# Patient Record
Sex: Female | Born: 1945 | Race: Black or African American | Hispanic: No | Smoking: Never smoker
Health system: Southern US, Community
[De-identification: ages and names within clinical notes are randomized; demographics above are authoritative.]

## PROBLEM LIST (undated history)

## (undated) DIAGNOSIS — E538 Deficiency of other specified B group vitamins: Secondary | ICD-10-CM

## (undated) DIAGNOSIS — Z923 Personal history of irradiation: Secondary | ICD-10-CM

## (undated) DIAGNOSIS — Z86718 Personal history of other venous thrombosis and embolism: Secondary | ICD-10-CM

## (undated) DIAGNOSIS — M810 Age-related osteoporosis without current pathological fracture: Secondary | ICD-10-CM

## (undated) DIAGNOSIS — E039 Hypothyroidism, unspecified: Secondary | ICD-10-CM

## (undated) DIAGNOSIS — E1129 Type 2 diabetes mellitus with other diabetic kidney complication: Secondary | ICD-10-CM

## (undated) DIAGNOSIS — D6859 Other primary thrombophilia: Secondary | ICD-10-CM

## (undated) DIAGNOSIS — Z9889 Other specified postprocedural states: Secondary | ICD-10-CM

## (undated) DIAGNOSIS — R809 Proteinuria, unspecified: Secondary | ICD-10-CM

## (undated) DIAGNOSIS — E785 Hyperlipidemia, unspecified: Secondary | ICD-10-CM

## (undated) DIAGNOSIS — I10 Essential (primary) hypertension: Secondary | ICD-10-CM

## (undated) DIAGNOSIS — Z8489 Family history of other specified conditions: Secondary | ICD-10-CM

## (undated) DIAGNOSIS — E559 Vitamin D deficiency, unspecified: Secondary | ICD-10-CM

## (undated) DIAGNOSIS — J45909 Unspecified asthma, uncomplicated: Secondary | ICD-10-CM

## (undated) DIAGNOSIS — K635 Polyp of colon: Secondary | ICD-10-CM

## (undated) DIAGNOSIS — Z8619 Personal history of other infectious and parasitic diseases: Secondary | ICD-10-CM

## (undated) DIAGNOSIS — R112 Nausea with vomiting, unspecified: Secondary | ICD-10-CM

## (undated) DIAGNOSIS — I82409 Acute embolism and thrombosis of unspecified deep veins of unspecified lower extremity: Secondary | ICD-10-CM

## (undated) DIAGNOSIS — E041 Nontoxic single thyroid nodule: Secondary | ICD-10-CM

## (undated) DIAGNOSIS — M1712 Unilateral primary osteoarthritis, left knee: Secondary | ICD-10-CM

## (undated) DIAGNOSIS — R918 Other nonspecific abnormal finding of lung field: Secondary | ICD-10-CM

## (undated) DIAGNOSIS — E0590 Thyrotoxicosis, unspecified without thyrotoxic crisis or storm: Secondary | ICD-10-CM

## (undated) DIAGNOSIS — IMO0002 Reserved for concepts with insufficient information to code with codable children: Secondary | ICD-10-CM

## (undated) DIAGNOSIS — R011 Cardiac murmur, unspecified: Secondary | ICD-10-CM

## (undated) HISTORY — DX: Unspecified asthma, uncomplicated: J45.909

## (undated) HISTORY — DX: Acute embolism and thrombosis of unspecified deep veins of unspecified lower extremity: I82.409

## (undated) HISTORY — PX: KNEE ARTHROSCOPY: SUR90

## (undated) HISTORY — DX: Essential (primary) hypertension: I10

## (undated) HISTORY — DX: Hypothyroidism, unspecified: E03.9

## (undated) HISTORY — DX: Polyp of colon: K63.5

## (undated) HISTORY — DX: Personal history of other venous thrombosis and embolism: Z86.718

## (undated) HISTORY — PX: TUBAL LIGATION: SHX77

## (undated) HISTORY — PX: TONSILLECTOMY: SUR1361

## (undated) HISTORY — DX: Thyrotoxicosis, unspecified without thyrotoxic crisis or storm: E05.90

## (undated) HISTORY — DX: Reserved for concepts with insufficient information to code with codable children: IMO0002

## (undated) HISTORY — DX: Hyperlipidemia, unspecified: E78.5

---

## 1977-09-20 DIAGNOSIS — Z86718 Personal history of other venous thrombosis and embolism: Secondary | ICD-10-CM

## 1977-09-20 HISTORY — DX: Personal history of other venous thrombosis and embolism: Z86.718

## 1978-09-20 DIAGNOSIS — Z86718 Personal history of other venous thrombosis and embolism: Secondary | ICD-10-CM

## 1978-09-20 HISTORY — DX: Personal history of other venous thrombosis and embolism: Z86.718

## 1982-09-20 HISTORY — PX: BREAST BIOPSY: SHX20

## 1989-09-20 HISTORY — PX: ABDOMINAL HYSTERECTOMY: SHX81

## 2001-09-20 HISTORY — PX: BLADDER SUSPENSION: SHX72

## 2003-10-11 LAB — HM PAP SMEAR

## 2004-08-12 ENCOUNTER — Inpatient Hospital Stay (HOSPITAL_COMMUNITY): Admission: RE | Admit: 2004-08-12 | Discharge: 2004-08-13 | Payer: Self-pay | Admitting: Gynecology

## 2005-03-12 ENCOUNTER — Emergency Department: Payer: Self-pay | Admitting: General Practice

## 2005-03-12 ENCOUNTER — Other Ambulatory Visit: Payer: Self-pay

## 2005-06-17 ENCOUNTER — Observation Stay (HOSPITAL_COMMUNITY): Admission: EM | Admit: 2005-06-17 | Discharge: 2005-06-18 | Payer: Self-pay | Admitting: Emergency Medicine

## 2005-06-18 ENCOUNTER — Ambulatory Visit: Payer: Self-pay | Admitting: Cardiovascular Disease

## 2005-06-23 ENCOUNTER — Ambulatory Visit: Payer: Self-pay

## 2005-10-10 LAB — HM COLONOSCOPY

## 2006-05-31 ENCOUNTER — Ambulatory Visit: Payer: Self-pay | Admitting: Cardiology

## 2006-05-31 ENCOUNTER — Inpatient Hospital Stay (HOSPITAL_COMMUNITY): Admission: EM | Admit: 2006-05-31 | Discharge: 2006-06-01 | Payer: Self-pay | Admitting: Emergency Medicine

## 2006-06-07 ENCOUNTER — Ambulatory Visit: Payer: Self-pay

## 2006-06-17 ENCOUNTER — Ambulatory Visit: Payer: Self-pay | Admitting: Cardiology

## 2006-06-23 ENCOUNTER — Ambulatory Visit: Payer: Self-pay | Admitting: Family Medicine

## 2006-07-25 ENCOUNTER — Ambulatory Visit: Payer: Self-pay | Admitting: Urology

## 2006-07-27 ENCOUNTER — Emergency Department: Payer: Self-pay | Admitting: Emergency Medicine

## 2006-07-28 ENCOUNTER — Ambulatory Visit: Payer: Self-pay | Admitting: Internal Medicine

## 2006-08-08 ENCOUNTER — Ambulatory Visit: Payer: Self-pay | Admitting: Internal Medicine

## 2008-01-02 ENCOUNTER — Ambulatory Visit: Payer: Self-pay | Admitting: Unknown Physician Specialty

## 2008-01-18 ENCOUNTER — Ambulatory Visit: Payer: Self-pay

## 2009-04-09 ENCOUNTER — Inpatient Hospital Stay: Payer: Self-pay | Admitting: Internal Medicine

## 2011-03-08 ENCOUNTER — Emergency Department: Payer: Self-pay

## 2011-03-12 ENCOUNTER — Ambulatory Visit: Payer: Self-pay

## 2011-03-12 ENCOUNTER — Ambulatory Visit: Payer: Self-pay | Admitting: "Endocrinology

## 2011-03-30 ENCOUNTER — Ambulatory Visit: Payer: Self-pay

## 2011-09-21 HISTORY — PX: CORONARY ANGIOPLASTY: SHX604

## 2011-11-11 ENCOUNTER — Ambulatory Visit: Payer: Self-pay | Admitting: "Endocrinology

## 2012-03-10 LAB — HM MAMMOGRAPHY: HM Mammogram: NORMAL

## 2012-10-10 ENCOUNTER — Telehealth: Payer: Self-pay | Admitting: Internal Medicine

## 2012-10-10 ENCOUNTER — Ambulatory Visit (INDEPENDENT_AMBULATORY_CARE_PROVIDER_SITE_OTHER): Payer: Medicare Other | Admitting: Internal Medicine

## 2012-10-10 ENCOUNTER — Encounter: Payer: Self-pay | Admitting: Internal Medicine

## 2012-10-10 VITALS — BP 128/78 | HR 58 | Temp 98.5°F | Resp 16 | Ht 59.0 in | Wt 152.0 lb

## 2012-10-10 DIAGNOSIS — E039 Hypothyroidism, unspecified: Secondary | ICD-10-CM | POA: Insufficient documentation

## 2012-10-10 DIAGNOSIS — K589 Irritable bowel syndrome without diarrhea: Secondary | ICD-10-CM

## 2012-10-10 DIAGNOSIS — E785 Hyperlipidemia, unspecified: Secondary | ICD-10-CM | POA: Insufficient documentation

## 2012-10-10 DIAGNOSIS — M810 Age-related osteoporosis without current pathological fracture: Secondary | ICD-10-CM

## 2012-10-10 DIAGNOSIS — E079 Disorder of thyroid, unspecified: Secondary | ICD-10-CM

## 2012-10-10 DIAGNOSIS — F411 Generalized anxiety disorder: Secondary | ICD-10-CM

## 2012-10-10 DIAGNOSIS — I10 Essential (primary) hypertension: Secondary | ICD-10-CM | POA: Insufficient documentation

## 2012-10-10 DIAGNOSIS — F419 Anxiety disorder, unspecified: Secondary | ICD-10-CM

## 2012-10-10 DIAGNOSIS — J45909 Unspecified asthma, uncomplicated: Secondary | ICD-10-CM

## 2012-10-10 MED ORDER — DICYCLOMINE HCL 10 MG PO CAPS: 10.0000 mg | ORAL_CAPSULE | Freq: Three times a day (TID) | ORAL | Status: DC

## 2012-10-10 NOTE — Telephone Encounter (Signed)
At check out pt stated she [facility]

## 2012-10-10 NOTE — Telephone Encounter (Signed)
Chart updated with information.

## 2012-10-10 NOTE — Patient Instructions (Addendum)
We will order the bone density test and the nebulizer  Resume zyrtec daily for allergies when needed   We will try dicylcomine for your IBS  I wilsend rx to [facility]or[facility] pharmacy

## 2012-10-10 NOTE — Progress Notes (Signed)
Patient ID: Christine Manning, female   DOB: [birth year], 68 y.o.   MRN: [id number]  Patient Active Problem List  Diagnosis  . Hyperlipidemia  . Hypertension  . Asthma  . Thyroid disease  . Anxiety  . Irritable bowel syndrome  . Postmenopausal osteoporosis    Subjective:  CC:   Chief Complaint  Patient presents with  . Establish Care    HPI:   Christine Manning is a 67 y.o. female who presents as a new patient to establish primary care with the chief complaint of Transfer  From [facility],  sees Endocrine for thyroid,  Moved here from [place] years ago to care for ailing parents .   1) thyroid nodule s/p low thyroid, takes 150 mcg daily for the past 3 years. Went for a year without meds due to lack of $$$$  Had labs done last Tuesday .  Takes it currently alone in the morning. Currently out of meds from [facility]. Tp XRT.  Medicare.    2) osteoporosis; 5th finger fractures bilaterally from lifting boxes.  Since 1998.   steroid induced ,  Managed initially with alendronate now  with boniva currently monthly which is causing nausea and vomiting.  Prior 1 yr therapy with Forteo.   Wants repeat DEXA and alternative medication.   3) asthma: retired from [employer] and had lots of envirnmental office triggers causing attacks.  Attacks had been reduced to monthly since retirement 2010.  Last ER visit over one year. uses Advair twice daily,  Ventolin prn wants a home nebulizer for use dring severe attacks.   4) hyperlipidemia, hypertension  previously treated with lipitor   5) thumb pain and knee pain due to 14 yrs of standing on cement floor . Bilateral thumb pain aggravated by opening jars and putting on pntyhose.  Use to run, whear heels   6) Left knee pain,  Prior films showed DJD severe . Had cardiac arrest in 2009  during anesthesia due to underactive thyroid. Cardiac cath followed.   7) insomnia.  Due to dad's sundowning and nighttime wanderings   Past Medical History  Diagnosis Date    . Hyperlipidemia   . Hypertension   . Asthma   . Thyroid disease     Past Surgical History  Procedure Date  . Bladder suspension 2003  . Abdominal hysterectomy 1991    secondary to abnormal PAPs  . Coronary angioplasty 2013    arteries were clean .  Dr. Laveda Norman at [facility]     Family History  Problem Relation Age of Onset  . Asthma Mother   . Mental illness Mother     multi infarct dementia  . Stroke Mother   . Mental illness Father     alzheimers dementia  . Asthma Brother   . Asthma Son   . Cancer Maternal Aunt     colon ca  . Cancer Paternal Aunt     colon ca  . Cancer Maternal Grandmother     uterine ca    History   Social History  . Marital Status: Divorced    Spouse Name: N/A    Number of Children: N/A  . Years of Education: N/A   Occupational History  . Not on file.   Social History Main Topics  . Smoking status: Never Smoker   . Smokeless tobacco: Not on file  . Alcohol Use: No  . Drug Use: No  . Sexually Active: Not on file   Other Topics Concern  . 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     Allergies  Allergen Reactions  . Codeine Hives and Shortness Of Breath  . Latex Hives and Swelling  . Augmentin (Amoxicillin-Pot Clavulanate) Other (See Comments)    Causes IBS     Review of Systems:   The remainder of the review of systems was negative except those addressed in the HPI.       Objective:  BP 128/78  Pulse 58  Temp 98.5 F (36.9 C) (Oral)  Resp 16  Ht 4\' 11"  (1.499 m)  Wt 152 lb (68.947 kg)  BMI 30.70 kg/m2  SpO2 98%  General appearance: alert, cooperative and appears stated age Ears: normal TM's and external ear canals both ears Throat: lips, mucosa, and tongue normal; teeth and gums normal Neck: no adenopathy, no carotid bruit, supple, symmetrical, trachea midline and thyroid not enlarged, symmetric, no tenderness/mass/nodules Back: symmetric, no curvature. ROM normal.  No CVA tenderness. Lungs: clear to auscultation bilaterally Heart: regular rate and rhythm, S1, S2 normal, no murmur, click, rub or gallop Abdomen: soft, non-tender; bowel sounds normal; no masses,  no organomegaly Pulses: 2+ and symmetric Skin: Skin color, texture, turgor normal. No rashes or lesions Lymph nodes: Cervical, supraclavicular, and axillary nodes normal.  Assessment and Plan:  Irritable bowel syndrome Trial of bentyl for postprandial cramping and diarrhea.   Thyroid disease Her thyroid is still underactive after a year of not being medicated.  Samples od 150 mcg,  Her current dose given while she is awaiting repeat TSH.   Postmenopausal osteoporosis Managed with bisphosphonate therapy. Repeat DEXA is ordered.   Asthma She is requesting a home nebulizer for management of asthma. Discussed the risks and benefits and she has agreed to call the office when she is symptomatic enough to use the neb.   Updated Medication List Outpatient Encounter Prescriptions as of 10/10/2012  Medication Sig Dispense Refill  . Albuterol Sulfate (VENTOLIN HFA IN) Inhale 90 mcg into the lungs. 2 puffs every four hours as needed.      Marland Kitchen aspirin 81 MG tablet Take 81 mg by mouth daily.      . Fluticasone-Salmeterol (ADVAIR) 250-50 MCG/DOSE AEPB Inhale 1 puff into the lungs every 12 (twelve) hours.      . hydrochlorothiazide (HYDRODIURIL) 25 MG tablet Take 25 mg by mouth daily.      . hydrOXYzine (ATARAX/VISTARIL) 25 MG tablet Take 25 mg by mouth 3 (three) times daily as needed.      . ibandronate (BONIVA) 150 MG tablet Take 150 mg by mouth every 30 (thirty) days. Take in the morning with a full glass of water, on an empty stomach, and do not take anything else by mouth or lie down for the next 30 min.      Marland Kitchen levothyroxine (SYNTHROID, LEVOTHROID) 150 MCG tablet Take 150 mcg by mouth daily.      Marland Kitchen lisinopril (PRINIVIL,ZESTRIL) 20 MG tablet Take 20 mg by mouth daily.      . metoprolol tartrate  (LOPRESSOR) 25 MG tablet Take 25 mg by mouth 2 (two) times daily.      . mometasone (NASONEX) 50 MCG/ACT nasal spray Place 2 sprays into the nose daily.      . nitroGLYCERIN (NITROSTAT) 0.4 MG SL tablet Place 0.4 mg under the tongue every 5 (five) minutes as needed.      . pantoprazole (PROTONIX) 40 MG tablet Take 40 mg by mouth daily.      Marland Kitchen  predniSONE (DELTASONE) 10 MG tablet Take 10 mg by mouth daily.      . simvastatin (ZOCOR) 40 MG tablet Take 40 mg by mouth every evening.      . dicyclomine (BENTYL) 10 MG capsule Take 1 capsule (10 mg total) by mouth 4 (four) times daily -  before meals and at bedtime.  90 capsule  2     Orders Placed This Encounter  Procedures  . HM MAMMOGRAPHY  . DG Bone Density  . HM PAP SMEAR  . AMB REFERRAL FOR DME  . HM COLONOSCOPY    Return in about 4 weeks (around 11/07/2012).

## 2012-10-11 ENCOUNTER — Encounter: Payer: Self-pay | Admitting: Internal Medicine

## 2012-10-11 DIAGNOSIS — F419 Anxiety disorder, unspecified: Secondary | ICD-10-CM | POA: Insufficient documentation

## 2012-10-11 DIAGNOSIS — M810 Age-related osteoporosis without current pathological fracture: Secondary | ICD-10-CM | POA: Insufficient documentation

## 2012-10-11 DIAGNOSIS — K589 Irritable bowel syndrome without diarrhea: Secondary | ICD-10-CM | POA: Insufficient documentation

## 2012-10-11 NOTE — Assessment & Plan Note (Signed)
Her thyroid is still underactive after a year of not being medicated.  Samples od 150 mcg,  Her current dose given while she is awaiting repeat TSH.

## 2012-10-11 NOTE — Assessment & Plan Note (Signed)
She is requesting a home nebulizer for management of asthma. Discussed the risks and benefits and she has agreed to call the office when she is symptomatic enough to use the neb.

## 2012-10-11 NOTE — Assessment & Plan Note (Signed)
Trial of bentyl for postprandial cramping and diarrhea.

## 2012-10-11 NOTE — Assessment & Plan Note (Signed)
Managed with bisphosphonate therapy. Repeat DEXA is ordered.

## 2012-10-23 ENCOUNTER — Institutional Professional Consult (permissible substitution): Payer: Medicare Other | Admitting: Pulmonary Disease

## 2012-11-04 ENCOUNTER — Emergency Department: Payer: Self-pay | Admitting: Emergency Medicine

## 2012-11-04 LAB — BASIC METABOLIC PANEL
Anion Gap: 7 (ref 7–16)
BUN: 14 mg/dL (ref 7–18)
Calcium, Total: 8.8 mg/dL (ref 8.5–10.1)
Creatinine: 0.9 mg/dL (ref 0.60–1.30)
EGFR (Non-African Amer.): >60
Glucose: 90 mg/dL (ref 65–99)
Potassium: 3.6 mmol/L (ref 3.5–5.1)

## 2012-11-04 LAB — TROPONIN I: Troponin-I: <0.02 ng/mL

## 2012-11-04 LAB — CBC
HGB: 11.7 g/dL — ABNORMAL LOW (ref 12.0–16.0)
MCHC: 33.3 g/dL (ref 32.0–36.0)
Platelet: 316 10*3/uL (ref 150–440)
RBC: 3.75 10*6/uL — ABNORMAL LOW (ref 3.80–5.20)
WBC: 4.7 10*3/uL (ref 3.6–11.0)

## 2012-11-04 LAB — CK TOTAL AND CKMB (NOT AT ARMC): CK, Total: 72 U/L (ref 21–215)

## 2012-11-07 ENCOUNTER — Encounter: Payer: Self-pay | Admitting: Internal Medicine

## 2012-11-07 ENCOUNTER — Ambulatory Visit (INDEPENDENT_AMBULATORY_CARE_PROVIDER_SITE_OTHER): Payer: Medicare Other | Admitting: Internal Medicine

## 2012-11-07 VITALS — BP 128/88 | HR 92 | Temp 97.8°F | Resp 17 | Wt 143.8 lb

## 2012-11-07 DIAGNOSIS — R112 Nausea with vomiting, unspecified: Secondary | ICD-10-CM

## 2012-11-07 DIAGNOSIS — J111 Influenza due to unidentified influenza virus with other respiratory manifestations: Secondary | ICD-10-CM

## 2012-11-07 DIAGNOSIS — R42 Dizziness and giddiness: Secondary | ICD-10-CM

## 2012-11-07 DIAGNOSIS — H6690 Otitis media, unspecified, unspecified ear: Secondary | ICD-10-CM

## 2012-11-07 DIAGNOSIS — I951 Orthostatic hypotension: Secondary | ICD-10-CM

## 2012-11-07 DIAGNOSIS — E860 Dehydration: Secondary | ICD-10-CM

## 2012-11-07 LAB — CBC WITH DIFFERENTIAL/PLATELET
Basophils Absolute: 0.1 10*3/uL (ref 0.0–0.1)
Basophils Relative: 1.3 % (ref 0.0–3.0)
Eosinophils Absolute: 0.1 10*3/uL (ref 0.0–0.7)
Eosinophils Relative: 1.9 % (ref 0.0–5.0)
HCT: 37.3 % (ref 36.0–46.0)
Hemoglobin: 12.1 g/dL (ref 12.0–15.0)
Lymphocytes Relative: 44.2 % (ref 12.0–46.0)
Lymphs Abs: 2.2 10*3/uL (ref 0.7–4.0)
MCHC: 32.5 g/dL (ref 30.0–36.0)
MCV: 94.7 fl (ref 78.0–100.0)
Monocytes Absolute: 0.4 10*3/uL (ref 0.1–1.0)
Monocytes Relative: 7.2 % (ref 3.0–12.0)
Neutro Abs: 2.2 10*3/uL (ref 1.4–7.7)
Neutrophils Relative %: 45.4 % (ref 43.0–77.0)
Platelets: 353 10*3/uL (ref 150.0–400.0)
RBC: 3.94 Mil/uL (ref 3.87–5.11)
RDW: 14.6 % (ref 11.5–14.6)
WBC: 4.9 10*3/uL (ref 4.5–10.5)

## 2012-11-07 LAB — COMPREHENSIVE METABOLIC PANEL
ALT: 12 U/L (ref 0–35)
AST: 18 U/L (ref 0–37)
Albumin: 4.1 g/dL (ref 3.5–5.2)
Alkaline Phosphatase: 52 U/L (ref 39–117)
BUN: 18 mg/dL (ref 6–23)
CO2: 28 mEq/L (ref 19–32)
Calcium: 9.1 mg/dL (ref 8.4–10.5)
Chloride: 105 mEq/L (ref 96–112)
Creatinine, Ser: 1.1 mg/dL (ref 0.4–1.2)
GFR: 66.15 mL/min (ref >60.00)
Glucose, Bld: 100 mg/dL — ABNORMAL HIGH (ref 70–99)
Potassium: 3.4 mEq/L — ABNORMAL LOW (ref 3.5–5.1)
Sodium: 142 mEq/L (ref 135–145)
Total Bilirubin: 0.5 mg/dL (ref 0.3–1.2)
Total Protein: 7.6 g/dL (ref 6.0–8.3)

## 2012-11-07 LAB — MAGNESIUM: Magnesium: 2.1 mg/dL (ref 1.5–2.5)

## 2012-11-07 MED ORDER — PREDNISONE (PAK) 10 MG PO TABS: ORAL_TABLET | ORAL | Status: DC

## 2012-11-07 MED ORDER — PROMETHAZINE HCL 12.5 MG PO TABS: 12.5000 mg | ORAL_TABLET | Freq: Three times a day (TID) | ORAL | Status: DC | PRN

## 2012-11-07 MED ORDER — LEVOFLOXACIN 500 MG PO TABS: 500.0000 mg | ORAL_TABLET | Freq: Every day | ORAL | Status: DC

## 2012-11-07 MED ORDER — CULTURELLE DIGESTIVE HEALTH PO CAPS: 1.0000 | ORAL_CAPSULE | Freq: Every day | ORAL | Status: DC

## 2012-11-07 NOTE — Progress Notes (Signed)
Patient ID: Christine Manning, female   DOB: [birth year], 67 y.o.   MRN: [id number]   Patient Active Problem List  Diagnosis  . Hyperlipidemia  . Hypertension  . Asthma  . Thyroid disease  . Anxiety  . Irritable bowel syndrome  . Postmenopausal osteoporosis  . Orthostasis  . Dehydration, mild    Subjective:  CC:   Chief Complaint  Patient presents with  . Follow-up    4 week     HPI:   Christine Manning a 67 y.o. female who presents with cold symptoms with sinus pain and cough that started with a cough last Tuesday,  Felt so bad she went to ER Saturday night, treated with azithromycin x 3 days  And a cough syrup.  After she returned  Home she started vomiting and had multiple episodes Saturday and Sunday.  Had vomiting again yesterday .  No  Diarrhea.   Flu test was negative.  But she had all the symptoms of the fle.  Tolerating ginger ale and seven up and jello, and chicken broth /  Feels nauseated and woozy with position change  Positive orthostatics,  Systolic 134 to 100  Pulse 60 to 84.      Past Medical History  Diagnosis Date  . Hyperlipidemia   . Hypertension   . Asthma   . Thyroid disease     Past Surgical History  Procedure Laterality Date  . Bladder suspension  2003  . Abdominal hysterectomy  1991    secondary to abnormal PAPs  . Coronary angioplasty  2013    arteries were clean .  Dr. Laveda [facility] at [facility]     The following portions of the patient's history were reviewed and updated as appropriate: Allergies, current medications, and problem list.    Review of Systems:   Patient denies headache, fevers, malaise, unintentional weight loss, skin rash, eye pain, sinus congestion and sinus pain, sore throat, dysphagia,  hemoptysis , cough, dyspnea, wheezing, chest pain, palpitations, orthopnea, edema, abdominal pain, nausea, melena, diarrhea, constipation, flank pain, dysuria, hematuria, urinary  Frequency, nocturia, numbness, tingling, seizures,  Focal weakness, Loss of  consciousness,  Tremor, insomnia, depression, anxiety, and suicidal ideation.     History   Social History  . Marital Status: Divorced    Spouse Name: N/A    Number of Children: N/A  . Years of Education: N/A   Occupational History  . Not on file.   Social History Main Topics  . Smoking status: Never Smoker   . Smokeless tobacco: Not on file  . Alcohol Use: No  . Drug Use: No  . Sexually Active: Not on file   Other Topics Concern  . 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     Objective:  BP 128/88  Pulse 92  Temp(Src) 97.8 F (36.6 C) (Oral)  Resp 17  Wt 143 lb 12 oz (65.205 kg)  BMI 29.02 kg/m2  SpO2 96%  General appearance: alert, cooperative and appears stated age Ears: normal TM's and external ear canals both ears Throat: lips, mucosa, and tongue normal; teeth and gums normal Neck: no adenopathy, no carotid bruit, supple, symmetrical, trachea midline and thyroid not enlarged, symmetric, no tenderness/mass/nodules Back: symmetric, no curvature. ROM normal. No CVA tenderness. Lungs: clear to auscultation bilaterally Heart: regular rate and rhythm, S1, S2 normal, no murmur, click, rub or gallop Abdomen: soft, non-tender; bowel soundsquiet but present.  No rebound or guarding; no masses,  no organomegaly Pulses: 2+ and symmetric Skin: Skin color, texture normal.Mild tenting on exam  No rashes or lesions Lymph nodes: Cervical, supraclavicular, and axillary nodes normal.  Assessment and Plan:  Dehydration, mild K slightly low.  Gatorade, saltiens.   Orthostasis Secondary to dehydration from GI illness.  Push fluids,  Clear liquid diet. Stop the hctz until she is no longer symptomatic  Influenza with other respiratory manifestations Suggested by history despite negative flu test done in Er.    Updated Medication List Outpatient Encounter Prescriptions as of 11/07/2012  Medication Sig Dispense Refill  .  Albuterol Sulfate (VENTOLIN HFA IN) Inhale 90 mcg into the lungs. 2 puffs every four hours as needed.      Marland Kitchen aspirin 81 MG tablet Take 81 mg by mouth daily.      Marland Kitchen dicyclomine (BENTYL) 10 MG capsule Take 1 capsule (10 mg total) by mouth 4 (four) times daily -  before meals and at bedtime.  90 capsule  2  . Fluticasone-Salmeterol (ADVAIR) 250-50 MCG/DOSE AEPB Inhale 1 puff into the lungs every 12 (twelve) hours.      . hydrochlorothiazide (HYDRODIURIL) 25 MG tablet Take 25 mg by mouth daily.      . hydrOXYzine (ATARAX/VISTARIL) 25 MG tablet Take 25 mg by mouth 3 (three) times daily as needed.      . ibandronate (BONIVA) 150 MG tablet Take 150 mg by mouth every 30 (thirty) days. Take in the morning with a full glass of water, on an empty stomach, and do not take anything else by mouth or lie down for the next 30 min.      Marland Kitchen levothyroxine (SYNTHROID, LEVOTHROID) 150 MCG tablet Take 150 mcg by mouth daily.      Marland Kitchen lisinopril (PRINIVIL,ZESTRIL) 20 MG tablet Take 20 mg by mouth daily.      . metoprolol tartrate (LOPRESSOR) 25 MG tablet Take 25 mg by mouth 2 (two) times daily.      . mometasone (NASONEX) 50 MCG/ACT nasal spray Place 2 sprays into the nose daily.      . nitroGLYCERIN (NITROSTAT) 0.4 MG SL tablet Place 0.4 mg under the tongue every 5 (five) minutes as needed.      . pantoprazole (PROTONIX) 40 MG tablet Take 40 mg by mouth daily.      . predniSONE (DELTASONE) 10 MG tablet Take 10 mg by mouth daily.      . simvastatin (ZOCOR) 40 MG tablet Take 40 mg by mouth every evening.      . Lactobacillus-Inulin (CULTURELLE DIGESTIVE HEALTH) CAPS Take 1 capsule by mouth daily.  14 capsule  0  . levofloxacin (LEVAQUIN) 500 MG tablet Take 1 tablet (500 mg total) by mouth daily.  7 tablet  0  . predniSONE (STERAPRED UNI-PAK) 10 MG tablet 6 tablets on Day 1 , then reduce by 1 tablet daily until gone  21 tablet  0  . promethazine (PHENERGAN) 12.5 MG tablet Take 1 tablet (12.5 mg total) by mouth every 8 (eight)  hours as needed for nausea.  20 tablet  0   No facility-administered encounter medications on file as of 11/07/2012.

## 2012-11-07 NOTE — Patient Instructions (Addendum)
You have a sinus/ear infection and you are very dehydrated from your vomiting,    .  I am prescribing an antibiotic (levaquin), a  prednisone taper (decreasing amount daily for 6 days)  to manage the infection and the inflammation in your ear/sinuses, and promethazine for the nausea.   Culturelle or another "probiotic" to prevent diarrhea   Use Sudafed PE  10 to 30 mg every 8 hours for the congestion to help your ears decompress.  flushes your sinuses twice daily with Simply Saline (do over the sink because if you do it right you will spit out globs of mucus)  Call us back with the name of the cough medicine   You need to drink as much fluids with electrolytes (sugar, salt, like gatorade) as you can  If you can tolerate Saltine crackers, chicken broth, toast that would be the nest step

## 2012-11-08 DIAGNOSIS — E860 Dehydration: Secondary | ICD-10-CM | POA: Insufficient documentation

## 2012-11-08 DIAGNOSIS — R42 Dizziness and giddiness: Secondary | ICD-10-CM | POA: Insufficient documentation

## 2012-11-08 DIAGNOSIS — J111 Influenza due to unidentified influenza virus with other respiratory manifestations: Secondary | ICD-10-CM | POA: Insufficient documentation

## 2012-11-08 NOTE — Assessment & Plan Note (Signed)
Suggested by history despite negative flu test done in Er.

## 2012-11-08 NOTE — Assessment & Plan Note (Addendum)
Secondary to dehydration from GI illness.  Push fluids,  Clear liquid diet. Stop the hctz until she is no longer symptomatic

## 2012-11-08 NOTE — Assessment & Plan Note (Signed)
K slightly low.  Gatorade, saltiens.

## 2012-11-22 ENCOUNTER — Ambulatory Visit (INDEPENDENT_AMBULATORY_CARE_PROVIDER_SITE_OTHER): Payer: Medicare Other | Admitting: Internal Medicine

## 2012-11-22 ENCOUNTER — Encounter: Payer: Self-pay | Admitting: Internal Medicine

## 2012-11-22 VITALS — BP 130/80 | HR 63 | Temp 98.0°F | Resp 16 | Wt 146.2 lb

## 2012-11-22 DIAGNOSIS — J111 Influenza due to unidentified influenza virus with other respiratory manifestations: Secondary | ICD-10-CM

## 2012-11-22 DIAGNOSIS — M25539 Pain in unspecified wrist: Secondary | ICD-10-CM

## 2012-11-22 DIAGNOSIS — Z86718 Personal history of other venous thrombosis and embolism: Secondary | ICD-10-CM | POA: Insufficient documentation

## 2012-11-22 DIAGNOSIS — M7989 Other specified soft tissue disorders: Secondary | ICD-10-CM

## 2012-11-22 DIAGNOSIS — M25531 Pain in right wrist: Secondary | ICD-10-CM

## 2012-11-22 DIAGNOSIS — E860 Dehydration: Secondary | ICD-10-CM

## 2012-11-22 MED ORDER — LEVOTHYROXINE SODIUM 150 MCG PO TABS: 150.0000 ug | ORAL_TABLET | Freq: Every day | ORAL | Status: DC

## 2012-11-22 NOTE — Progress Notes (Signed)
Patient ID: Christine Manning, female   DOB: Oct 07, 1945, 67 y.o.   MRN: [id number]   Patient Active Problem List  Diagnosis  . Hyperlipidemia  . Hypertension  . Asthma  . Thyroid disease  . Anxiety  . Irritable bowel syndrome  . Postmenopausal osteoporosis  . Orthostasis  . Dehydration, mild  . Influenza with other respiratory manifestations  . Personal history of DVT (deep vein thrombosis)  . Pain in joint, forearm    Subjective:  CC:   Chief Complaint  Patient presents with  . Follow-up    2 week     HPI:   Christine Manning a 67 y.o. female who presents Treated 2 weeks ago for flu like symptoms resulting in dehydration .  Mild hypokalemia,  Replaced with diet.  Most symptoms have resolved. Still coughing a little ,  Sometimes productive.  Bilateral forearm knots. Arms and legs cramping up at night and feeling sore. hurts to the point of using a heating pad, and she has noticed some swelling in the medial side in the muscle. worried about blood clots given history of DVT x 2 ,  During pregnancy .,  2nd time occurred 10 yrs later  during a perid where she was driving 5 hours weekly to mother.   She has been noticing stiffness in her lower back , hips and knee  joints when she gets up in the middle of the night or if she sits for more than 40 minutes at a time. Symptoms resolve after moving around for a few minutes.  No joint redness or warmth reported.     Past Medical History  Diagnosis Date  . Hyperlipidemia   . Hypertension   . Asthma   . Thyroid disease   . Personal history of DVT (deep vein thrombosis) 1980    x 2, pregnanacy and immobility induced     Past Surgical History  Procedure Laterality Date  . Bladder suspension  2003  . Abdominal hysterectomy  1991    secondary to abnormal PAPs  . Coronary angioplasty  2013    arteries were clean .  Dr. Laveda Norman at [facility]        The following portions of the patient's history were reviewed and updated as appropriate:  Allergies, current medications, and problem list.    Review of Systems:   12 Pt  review of systems was negative except those addressed in the HPI,     History   Social History  . Marital Status: Divorced    Spouse Name: N/A    Number of Children: N/A  . Years of Education: N/A   Occupational History  . Not on file.   Social History Main Topics  . Smoking status: Never Smoker   . Smokeless tobacco: Not on file  . Alcohol Use: No  . Drug Use: No  . Sexually Active: Not on file   Other Topics Concern  . 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     Objective:  BP 130/80  Pulse 63  Temp(Src) 98 F (36.7 C) (Oral)  Resp 16  Wt 146 lb 4 oz (66.339 kg)  BMI 29.52 kg/m2  SpO2 96%  General appearance: alert, cooperative and appears stated age Ears: normal TM's and external ear canals both ears Throat: lips, mucosa, and tongue normal; teeth and gums normal Neck: no adenopathy, no carotid bruit, supple, symmetrical, trachea midline and thyroid not enlarged, symmetric, no tenderness/mass/nodules  Back: symmetric, no curvature. ROM normal. No CVA tenderness. Lungs: clear to auscultation bilaterally Heart: regular rate and rhythm, S1, S2 normal, no murmur, click, rub or gallop Abdomen: soft, non-tender; bowel sounds normal; no masses,  no organomegaly Pulses: 2+ and symmetric Skin: Skin color, texture, turgor normal. No rashes or lesions Lymph nodes: Cervical, supraclavicular, and axillary nodes normal.  Assessment and Plan:  Dehydration, mild Resolved, electrolytes normal   Pain in joint, forearm With swelling and knot in forearm.  Given history of DVT, will obtain U/s to rule out DVT  Influenza with other respiratory manifestations Symptoms resolved.    Updated Medication List Outpatient Encounter Prescriptions as of 11/22/2012  Medication Sig Dispense Refill  . Albuterol Sulfate (VENTOLIN HFA IN) Inhale 90 mcg  into the lungs. 2 puffs every four hours as needed.      Marland Kitchen aspirin 81 MG tablet Take 81 mg by mouth daily.      Marland Kitchen dicyclomine (BENTYL) 10 MG capsule Take 1 capsule (10 mg total) by mouth 4 (four) times daily -  before meals and at bedtime.  90 capsule  2  . Fluticasone-Salmeterol (ADVAIR) 250-50 MCG/DOSE AEPB Inhale 1 puff into the lungs every 12 (twelve) hours.      . hydrochlorothiazide (HYDRODIURIL) 25 MG tablet Take 25 mg by mouth daily.      . hydrOXYzine (ATARAX/VISTARIL) 25 MG tablet Take 25 mg by mouth 3 (three) times daily as needed.      . ibandronate (BONIVA) 150 MG tablet Take 150 mg by mouth every 30 (thirty) days. Take in the morning with a full glass of water, on an empty stomach, and do not take anything else by mouth or lie down for the next 30 min.      . Lactobacillus-Inulin (CULTURELLE DIGESTIVE HEALTH) CAPS Take 1 capsule by mouth daily.  14 capsule  0  . levofloxacin (LEVAQUIN) 500 MG tablet Take 1 tablet (500 mg total) by mouth daily.  7 tablet  0  . levothyroxine (SYNTHROID, LEVOTHROID) 150 MCG tablet Take 1 tablet (150 mcg total) by mouth daily.  90 tablet  2  . lisinopril (PRINIVIL,ZESTRIL) 20 MG tablet Take 20 mg by mouth daily.      . metoprolol tartrate (LOPRESSOR) 25 MG tablet Take 25 mg by mouth 2 (two) times daily.      . mometasone (NASONEX) 50 MCG/ACT nasal spray Place 2 sprays into the nose daily.      . nitroGLYCERIN (NITROSTAT) 0.4 MG SL tablet Place 0.4 mg under the tongue every 5 (five) minutes as needed.      . pantoprazole (PROTONIX) 40 MG tablet Take 40 mg by mouth daily.      . predniSONE (DELTASONE) 10 MG tablet Take 10 mg by mouth daily.      . predniSONE (STERAPRED UNI-PAK) 10 MG tablet 6 tablets on Day 1 , then reduce by 1 tablet daily until gone  21 tablet  0  . promethazine (PHENERGAN) 12.5 MG tablet Take 1 tablet (12.5 mg total) by mouth every 8 (eight) hours as needed for nausea.  20 tablet  0  . simvastatin (ZOCOR) 40 MG tablet Take 40 mg by mouth  every evening.      . [DISCONTINUED] levothyroxine (SYNTHROID, LEVOTHROID) 150 MCG tablet Take 150 mcg by mouth daily.       No facility-administered encounter medications on file as of 11/22/2012.     Orders Placed This Encounter  Procedures  . [place] Extrem Up Right Comp  No Follow-up on file.

## 2012-11-22 NOTE — Patient Instructions (Addendum)
Your joint pain is from arthritis and may respond to Osteo biflex ,  Which can be taken and will not interefe with your other meds  Ultrasound of Right arm to rule out blood clot

## 2012-11-24 ENCOUNTER — Encounter: Payer: Self-pay | Admitting: Internal Medicine

## 2012-11-24 DIAGNOSIS — M25539 Pain in unspecified wrist: Secondary | ICD-10-CM | POA: Insufficient documentation

## 2012-11-24 NOTE — Assessment & Plan Note (Signed)
Resolved, electrolytes normal

## 2012-11-24 NOTE — Assessment & Plan Note (Signed)
Symptoms resolved 

## 2012-11-24 NOTE — Assessment & Plan Note (Signed)
With swelling and knot in forearm.  Given history of DVT, will obtain U/s to rule out DVT

## 2012-11-27 ENCOUNTER — Ambulatory Visit: Payer: Self-pay | Admitting: Internal Medicine

## 2012-11-28 ENCOUNTER — Telehealth: Payer: Self-pay | Admitting: Internal Medicine

## 2012-11-28 NOTE — Telephone Encounter (Signed)
Her upper extremity doppler showed no signs of a DVT in her right arm

## 2012-11-29 NOTE — Telephone Encounter (Signed)
Called pt could not leave a message. Will call again later.

## 2012-12-04 NOTE — Telephone Encounter (Signed)
Pt finally answered the phone and was notified of the results.

## 2012-12-15 ENCOUNTER — Encounter: Payer: Self-pay | Admitting: Internal Medicine

## 2013-03-13 ENCOUNTER — Telehealth: Payer: Self-pay | Admitting: Internal Medicine

## 2013-03-13 ENCOUNTER — Emergency Department: Payer: Self-pay | Admitting: Emergency Medicine

## 2013-03-13 LAB — COMPREHENSIVE METABOLIC PANEL
Albumin: 3.9 g/dL (ref 3.4–5.0)
Alkaline Phosphatase: 78 U/L (ref 50–136)
Anion Gap: 6 — ABNORMAL LOW (ref 7–16)
Calcium, Total: 9.1 mg/dL (ref 8.5–10.1)
Co2: 28 mmol/L (ref 21–32)
Creatinine: 0.91 mg/dL (ref 0.60–1.30)
EGFR (Non-African Amer.): >60
Osmolality: 283 (ref 275–301)
SGPT (ALT): 17 U/L (ref 12–78)
Total Protein: 8.2 g/dL (ref 6.4–8.2)

## 2013-03-13 LAB — CBC
HCT: 35.2 % (ref 35.0–47.0)
MCHC: 34.2 g/dL (ref 32.0–36.0)
MCV: 92 fL (ref 80–100)
RDW: 14.7 % — ABNORMAL HIGH (ref 11.5–14.5)
WBC: 6.2 10*3/uL (ref 3.6–11.0)

## 2013-03-13 LAB — LIPASE, BLOOD: Lipase: 167 U/L (ref 73–393)

## 2013-03-13 NOTE — Telephone Encounter (Signed)
FYI

## 2013-03-13 NOTE — Telephone Encounter (Signed)
Patient Information:  Caller Name: Dedra Skeens  Phone: [phone]  Patient: Christine Manning  Gender: Female  DOB: 11/10/1945  Age: 67 Years  PCP: Duncan Dull (Adults only)  Office Follow Up:  Does the office need to follow up with this patient?: No  Instructions For The Office: N/A  RN Note:  BP 179/100 w/ Vomiting, Dizziness and Diaphorsis, Pt denies Chest Pain.  Pt unable to hold down fluids or meds.  Attempted to reach Office RN, got vmail.  Due to Pt's symptoms, Pt's BP over 169/100 w/ unsteady gait, advised caller to have Pt seen at [facility] ED asap.  Symptoms  Reason For Call & Symptoms: ER CALL. Elevated BP, Vomiting, Dizziness and Diaphorsis  Reviewed Health History In EMR: N/A  Reviewed Medications In EMR: N/A  Reviewed Allergies In EMR: N/A  Reviewed Surgeries / Procedures: N/A  Date of Onset of Symptoms: 03/13/2013  Treatments Tried: ASA  Treatments Tried Worked: No  Guideline(s) Used:  High Blood Pressure  Disposition Per Guideline:   Go to ED Now (or to Office with PCP Approval)  Reason For Disposition Reached:   BP > 160 / 100 and any cardiac or neurologic symptoms (e.g., chest pain, difficulty breathing, unsteady gait, blurred vision)  Advice Given:  N/A  Patient Will Follow Care Advice:  YES

## 2013-03-21 ENCOUNTER — Telehealth: Payer: Self-pay | Admitting: Internal Medicine

## 2013-03-21 MED ORDER — METOPROLOL TARTRATE 25 MG PO TABS: 25.0000 mg | ORAL_TABLET | Freq: Two times a day (BID) | ORAL | Status: DC

## 2013-03-21 MED ORDER — LISINOPRIL 20 MG PO TABS: 20.0000 mg | ORAL_TABLET | Freq: Every day | ORAL | Status: DC

## 2013-03-21 MED ORDER — HYDROCHLOROTHIAZIDE 25 MG PO TABS: 25.0000 mg | ORAL_TABLET | Freq: Every day | ORAL | Status: DC

## 2013-03-21 NOTE — Telephone Encounter (Signed)
Patient needing her medication called to the pharmacy today.   lisinopril (PRINIVIL,ZESTRIL) 20 MG tablet   hydrochlorothiazide (HYDRODIURIL) 25 MG tablet   metoprolol tartrate (LOPRESSOR) 25 MG tablet

## 2013-07-05 ENCOUNTER — Encounter (INDEPENDENT_AMBULATORY_CARE_PROVIDER_SITE_OTHER): Payer: Self-pay

## 2013-07-05 ENCOUNTER — Ambulatory Visit (INDEPENDENT_AMBULATORY_CARE_PROVIDER_SITE_OTHER): Payer: Medicare Other | Admitting: Internal Medicine

## 2013-07-05 ENCOUNTER — Encounter: Payer: Self-pay | Admitting: Internal Medicine

## 2013-07-05 VITALS — BP 108/62 | HR 103 | Temp 98.3°F | Resp 14 | Wt 146.0 lb

## 2013-07-05 DIAGNOSIS — N390 Urinary tract infection, site not specified: Secondary | ICD-10-CM

## 2013-07-05 DIAGNOSIS — M549 Dorsalgia, unspecified: Secondary | ICD-10-CM

## 2013-07-05 DIAGNOSIS — E039 Hypothyroidism, unspecified: Secondary | ICD-10-CM

## 2013-07-05 DIAGNOSIS — I10 Essential (primary) hypertension: Secondary | ICD-10-CM

## 2013-07-05 DIAGNOSIS — R109 Unspecified abdominal pain: Secondary | ICD-10-CM

## 2013-07-05 DIAGNOSIS — M79604 Pain in right leg: Secondary | ICD-10-CM

## 2013-07-05 DIAGNOSIS — E079 Disorder of thyroid, unspecified: Secondary | ICD-10-CM

## 2013-07-05 DIAGNOSIS — G8929 Other chronic pain: Secondary | ICD-10-CM

## 2013-07-05 DIAGNOSIS — Z1239 Encounter for other screening for malignant neoplasm of breast: Secondary | ICD-10-CM

## 2013-07-05 DIAGNOSIS — Z23 Encounter for immunization: Secondary | ICD-10-CM

## 2013-07-05 DIAGNOSIS — M79609 Pain in unspecified limb: Secondary | ICD-10-CM

## 2013-07-05 DIAGNOSIS — R350 Frequency of micturition: Secondary | ICD-10-CM

## 2013-07-05 DIAGNOSIS — Z1211 Encounter for screening for malignant neoplasm of colon: Secondary | ICD-10-CM

## 2013-07-05 LAB — POCT URINALYSIS DIPSTICK
Bilirubin, UA: NEGATIVE
Glucose, UA: NEGATIVE
Ketones, UA: NEGATIVE
Nitrite, UA: NEGATIVE
Protein, UA: NEGATIVE
Spec Grav, UA: 1.025
Urobilinogen, UA: 0.2
pH, UA: 5.5

## 2013-07-05 LAB — COMPREHENSIVE METABOLIC PANEL
ALT: 12 U/L (ref 0–35)
AST: 18 U/L (ref 0–37)
Alkaline Phosphatase: 53 U/L (ref 39–117)
Creatinine, Ser: 1 mg/dL (ref 0.4–1.2)
GFR: 71.38 mL/min (ref >60.00)
Sodium: 142 mEq/L (ref 135–145)
Total Bilirubin: 0.4 mg/dL (ref 0.3–1.2)
Total Protein: 6.7 g/dL (ref 6.0–8.3)

## 2013-07-05 LAB — MICROALBUMIN / CREATININE URINE RATIO
Microalb Creat Ratio: 0.9 mg/g (ref 0.0–30.0)
Microalb, Ur: 2.5 mg/dL — ABNORMAL HIGH (ref 0.0–1.9)

## 2013-07-05 LAB — CK: Total CK: 92 U/L (ref 7–177)

## 2013-07-05 MED ORDER — CIPROFLOXACIN HCL 250 MG PO TABS: 250.0000 mg | ORAL_TABLET | Freq: Two times a day (BID) | ORAL | Status: DC

## 2013-07-05 NOTE — Assessment & Plan Note (Addendum)
Her UA is suggestive of UTI,  empiric cipro pending urine culture.

## 2013-07-05 NOTE — Patient Instructions (Addendum)
We are checking your thyroid function today  I am sending you for plain films of your lumbar spine and for an ultrasound of your womb to investigate your flank pain   I am starting you on ciprofloxacin for your UTI

## 2013-07-05 NOTE — Progress Notes (Signed)
Patient ID: Christine Manning, female   DOB: 1946-04-12, 67 y.o.   MRN: [id number]   Patient Active Problem List   Diagnosis Date Noted  . Chronic suprapubic pain 07/07/2013  . UTI (urinary tract infection) 07/05/2013  . Back pain with radiation 07/05/2013  . Personal history of DVT (deep vein thrombosis)   . Anxiety 10/11/2012  . Irritable bowel syndrome 10/11/2012  . 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 Hyperlipidemia   . Hypertension   . Asthma   . Unspecified hypothyroidism     Subjective:  CC:   Chief Complaint  Patient presents with  . Abdominal Pain    off and on x 1 month, left and right lower quadrants  . Urinary Frequency    started this morning    HPI:   Christine Manning a 67 y.o. female who presents  For 9 month follow up with Multiple complaints today   1) UTI symptoms today..  No recent travel and not sexuallya ctive but had a recent loose stool due to IBD<   2) Recurrent  bilateral flank pain that comes and goes, about once a week  Occurs on the left  more often than right but sometimes simultaneously in the lower abdomen, sometimes sharp  In nature.  No fevers , nausea or vaginal discharge. Has one ovary left. Not sure which side .  3)  History of colon polyps and a FH of colon ca,  Needs referral for a colonoscopy  5) Recurrent back pain,  Frequency  has been more problematic,  Aggravated by walking,  starts with pain in the legs and moves to the back.  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  History of arthroscopic surgery on the left  at [facility] in 1999      6) Hasn't had thyroid rechecked since starting synthroid for TSH of 105 in January!    Past Medical History  Diagnosis Date  . Hyperlipidemia   . Hypertension   . Asthma   . Thyroid disease   . Personal history of DVT (deep vein thrombosis) 1980    x 2, pregnanacy and immobility induced     Past Surgical History  Procedure Laterality Date  . Bladder suspension  2003  . Abdominal hysterectomy   1991    secondary to abnormal PAPs  . Coronary angioplasty  2013    arteries were clean .  Dr. Laveda Norman at [facility]        The following portions of the patient's history were reviewed and updated as appropriate: Allergies, current medications, and problem list.    Review of Systems:   12 Pt  review of systems was negative except those addressed in the HPI,     History   Social History  . Marital Status: Divorced    Spouse Name: N/A    Number of Children: N/A  . Years of Education: N/A   Occupational History  . Not on file.   Social History Main Topics  . Smoking status: Never Smoker   . Smokeless tobacco: Not on file  . Alcohol Use: No  . Drug Use: No  . Sexual Activity: Not on file   Other Topics Concern  . 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     Objective:  Filed Vitals:   07/05/13 1055  BP: 108/62  Pulse: 103  Temp: 98.3 F (36.8 C)  Resp: 14     General appearance: alert, cooperative and  appears stated age Ears: normal TM's and external ear canals both ears Throat: lips, mucosa, and tongue normal; teeth and gums normal Neck: no adenopathy, no carotid bruit, supple, symmetrical, trachea midline and thyroid not enlarged, symmetric, no tenderness/mass/nodules Back: symmetric, no curvature. ROM normal. No CVA tenderness. Lungs: clear to auscultation bilaterally Heart: regular rate and rhythm, S1, S2 normal, no murmur, click, rub or gallop Abdomen: soft, non-tender; bowel sounds normal; no masses,  no organomegaly Pulses: 2+ and symmetric Skin: Skin color, texture, turgor normal. No rashes or lesions Lymph nodes: Cervical, supraclavicular, and axillary nodes normal.  Assessment and Plan:  UTI (urinary tract infection) Her UA is suggestive of UTI,  empiric cipro pending urine culture.   Unspecified hypothyroidism Lab Results  Component Value Date   TSH 0.03* 07/05/2013   Thyroid is now  overactive.  Will reduce dose to 137  Mcg  and repeat  TSH in 6 weeks.   Chronic suprapubic pain Given her postmenopausal status and history of retained ovary,  need to rule out pelvic malignancy before attributing symptoms to irritable bowel. Ultrasound of pelvis ordered and CA 125 ordered. Lab Results  Component Value Date   CA125 4.7 07/05/2013    Hypertension Well controlled on current regimen. Renal function Manning, no changes today. Lab Results  Component Value Date   CREATININE 1.0 07/05/2013    Updated Medication List Outpatient Encounter Prescriptions as of 07/05/2013  Medication Sig Dispense Refill  . Albuterol Sulfate (VENTOLIN HFA IN) Inhale 90 mcg into the lungs. 2 puffs every four hours as needed.      Marland Kitchen aspirin 81 MG tablet Take 81 mg by mouth daily.      Marland Kitchen dicyclomine (BENTYL) 10 MG capsule Take 1 capsule (10 mg total) by mouth 4 (four) times daily -  before meals and at bedtime.  90 capsule  2  . Fluticasone-Salmeterol (ADVAIR) 250-50 MCG/DOSE AEPB Inhale 1 puff into the lungs every 12 (twelve) hours.      . hydrochlorothiazide (HYDRODIURIL) 25 MG tablet Take 1 tablet (25 mg total) by mouth daily.  30 tablet  5  . hydrOXYzine (ATARAX/VISTARIL) 25 MG tablet Take 25 mg by mouth 3 (three) times daily as needed.      . Lactobacillus-Inulin (CULTURELLE DIGESTIVE HEALTH) CAPS Take 1 capsule by mouth daily.  14 capsule  0  . levothyroxine (SYNTHROID, LEVOTHROID) 137 MCG tablet Take 1 tablet (137 mcg total) by mouth daily.  90 tablet  2  . lisinopril (PRINIVIL,ZESTRIL) 20 MG tablet Take 1 tablet (20 mg total) by mouth daily.  30 tablet  5  . metoprolol tartrate (LOPRESSOR) 25 MG tablet Take 1 tablet (25 mg total) by mouth 2 (two) times daily.  60 tablet  5  . nitroGLYCERIN (NITROSTAT) 0.4 MG SL tablet Place 0.4 mg under the tongue every 5 (five) minutes as needed.      . [DISCONTINUED] levothyroxine (SYNTHROID, LEVOTHROID) 150 MCG tablet Take 1 tablet (150 mcg total) by mouth  daily.  90 tablet  2  . ciprofloxacin (CIPRO) 250 MG tablet Take 1 tablet (250 mg total) by mouth 2 (two) times daily.  6 tablet  0  . mometasone (NASONEX) 50 MCG/ACT nasal spray Place 2 sprays into the nose daily.      . pantoprazole (PROTONIX) 40 MG tablet Take 40 mg by mouth daily.      . simvastatin (ZOCOR) 40 MG tablet Take 40 mg by mouth every evening.      . [DISCONTINUED] ibandronate (BONIVA)  150 MG tablet Take 150 mg by mouth every 30 (thirty) days. Take in the morning with a full glass of water, on an empty stomach, and do not take anything else by mouth or lie down for the next 30 min.      . [DISCONTINUED] levofloxacin (LEVAQUIN) 500 MG tablet Take 1 tablet (500 mg total) by mouth daily.  7 tablet  0  . [DISCONTINUED] predniSONE (DELTASONE) 10 MG tablet Take 10 mg by mouth daily.      . [DISCONTINUED] predniSONE (STERAPRED UNI-PAK) 10 MG tablet 6 tablets on Day 1 , then reduce by 1 tablet daily until gone  21 tablet  0  . [DISCONTINUED] promethazine (PHENERGAN) 12.5 MG tablet Take 1 tablet (12.5 mg total) by mouth every 8 (eight) hours as needed for nausea.  20 tablet  0   No facility-administered encounter medications on file as of 07/05/2013.     Orders Placed This Encounter  Procedures  . Urine culture  . DG Lumbar Spine Complete  . US Transvaginal Non-OB  . MM Digital Screening  . Flu Vaccine QUAD 36+ mos PF IM (Fluarix)  . Pneumococcal conjugate vaccine 13-valent less than 5yo IM  . Comprehensive metabolic panel  . TSH  . Microalbumin / creatinine urine ratio  . CK  . CA 125  . Ambulatory referral to Gastroenterology  . POCT Urinalysis Dipstick    No Follow-up on file.

## 2013-07-06 ENCOUNTER — Ambulatory Visit: Payer: Self-pay | Admitting: Internal Medicine

## 2013-07-06 LAB — CA 125: CA 125: 4.7 U/mL (ref 0.0–30.2)

## 2013-07-06 LAB — URINE CULTURE
Colony Count: NO GROWTH
Organism ID, Bacteria: NO GROWTH

## 2013-07-07 DIAGNOSIS — G8929 Other chronic pain: Secondary | ICD-10-CM | POA: Insufficient documentation

## 2013-07-07 MED ORDER — LEVOTHYROXINE SODIUM 137 MCG PO TABS: 137.0000 ug | ORAL_TABLET | Freq: Every day | ORAL | Status: DC

## 2013-07-07 NOTE — Assessment & Plan Note (Signed)
Neuro exam is normal.  DDD suspected, Plain films of lower back ordered.,

## 2013-07-07 NOTE — Assessment & Plan Note (Addendum)
Given her postmenopausal status and history of retained ovary,  need to rule out pelvic malignancy before attributing symptoms to irritable bowel. Ultrasound of pelvis ordered and CA 125 ordered. Lab Results  Component Value Date   CA125 4.7 07/05/2013

## 2013-07-07 NOTE — Assessment & Plan Note (Addendum)
Lab Results  Component Value Date   TSH 0.03* 07/05/2013   Thyroid is now overactive.  Will reduce dose to 125  Mcg  and repeat  TSH in 6 weeks.

## 2013-07-07 NOTE — Assessment & Plan Note (Signed)
Well controlled on current regimen. Renal function stable, no changes today.  Lab Results  Component Value Date   CREATININE 1.0 07/05/2013   

## 2013-07-08 ENCOUNTER — Telehealth: Payer: Self-pay | Admitting: Internal Medicine

## 2013-07-08 NOTE — Telephone Encounter (Signed)
Pelvic ultrasound was normal ,  If pain persists   She is to call back to schedule a CT of abdomen and pelvis with oral contrast

## 2013-07-09 ENCOUNTER — Ambulatory Visit (INDEPENDENT_AMBULATORY_CARE_PROVIDER_SITE_OTHER)
Admission: RE | Admit: 2013-07-09 | Discharge: 2013-07-09 | Disposition: A | Discharge status: Home / Self Care | Payer: Medicare Other | Source: Ambulatory Visit | Attending: Internal Medicine | Admitting: Internal Medicine

## 2013-07-09 DIAGNOSIS — M549 Dorsalgia, unspecified: Secondary | ICD-10-CM

## 2013-07-09 NOTE — Telephone Encounter (Signed)
Patient notified and states she has had no reoccurrence of  Pain since office visit.

## 2013-07-10 ENCOUNTER — Other Ambulatory Visit: Payer: Self-pay | Admitting: Internal Medicine

## 2013-07-10 DIAGNOSIS — M545 Low back pain: Secondary | ICD-10-CM

## 2013-07-16 ENCOUNTER — Ambulatory Visit (INDEPENDENT_AMBULATORY_CARE_PROVIDER_SITE_OTHER)
Admission: RE | Admit: 2013-07-16 | Discharge: 2013-07-16 | Disposition: A | Discharge status: Home / Self Care | Payer: Medicare Other | Source: Ambulatory Visit | Attending: Family Medicine | Admitting: Family Medicine

## 2013-07-16 ENCOUNTER — Ambulatory Visit (INDEPENDENT_AMBULATORY_CARE_PROVIDER_SITE_OTHER): Payer: Medicare Other | Admitting: Family Medicine

## 2013-07-16 ENCOUNTER — Encounter: Payer: Self-pay | Admitting: Family Medicine

## 2013-07-16 VITALS — BP 110/70 | HR 60 | Temp 98.1°F | Ht 59.0 in | Wt 146.0 lb

## 2013-07-16 DIAGNOSIS — M1710 Unilateral primary osteoarthritis, unspecified knee: Secondary | ICD-10-CM

## 2013-07-16 DIAGNOSIS — IMO0002 Reserved for concepts with insufficient information to code with codable children: Secondary | ICD-10-CM

## 2013-07-16 DIAGNOSIS — M25569 Pain in unspecified knee: Secondary | ICD-10-CM

## 2013-07-16 DIAGNOSIS — M25562 Pain in left knee: Secondary | ICD-10-CM

## 2013-07-16 DIAGNOSIS — M25561 Pain in right knee: Secondary | ICD-10-CM

## 2013-07-16 DIAGNOSIS — M1712 Unilateral primary osteoarthritis, left knee: Secondary | ICD-10-CM | POA: Insufficient documentation

## 2013-07-16 MED ORDER — MELOXICAM 15 MG PO TABS: 15.0000 mg | ORAL_TABLET | Freq: Every day | ORAL | Status: DC

## 2013-07-16 NOTE — Patient Instructions (Signed)
REFERRAL: GO THE THE FRONT ROOM AT THE ENTRANCE OF OUR CLINIC, NEAR CHECK IN. ASK FOR MARION. SHE WILL HELP YOU SET UP YOUR REFERRAL. DATE: TIME:  

## 2013-07-16 NOTE — Progress Notes (Signed)
Date:  07/16/2013   Name:  Christine Manning   DOB:  03/28/1946   MRN:  [id number] Gender: female Age: 67 y.o.  Primary Physician: Duncan Dull, MD  Dear Dr. Darrick Huntsman,  Thank you for having me see Christine Manning in consultation today at [facility] at [facility] for her problem with back pain and buttocks pain as well as knee pain.  As you may recall, she is a 66 y.o. year old female with a history of back pain with some intermittent radiculopathy, but now it seems more localized. Her primary complaint today is her left knee as the focus for pain, and her gait is altered.   She describes a distant L knee arthroscopy done at [facility] in 1999 by an unknown MD with what sounds like a partial or complete menisectomy and chondroplasty of 1 or more of her articular surfaces.   Pain is really bad and particularly with walking. (mostly at left knee) Pain with sleeping and when getting up in the low back. Sometimes in middle of back.  Describes a stabbing pain. More in the last few months.  No bowel or bladder incontinence. No numbness or tingling. It never goes beyond her upper thighs.   Retired now.  Tries to walk some.  Able to do only walking Used to go to the gym 5 days a week  In the lower leg, pain seems to come from her knees. Patient presents with multi-year h/o L sided knee pain after no injury. No audible pop was heard. The patient has not had an effusion. Occ symptomatic giving-way. No mechanical clicking. Joint has locked up or caught intermittently. Patient has been able to walk but is limping. The patient does have pain going up and down stairs or rising from a seated position.   Pain location: anterior and medial more Current physical activity: minimal Prior Knee Surgery: L arthroscopy Current pain meds: tylenol prn Bracing: none   Past Medical History  Diagnosis Date  . Hyperlipidemia   . Hypertension   . Asthma   . Thyroid disease   . Personal history of DVT (deep  vein thrombosis) 1980    x 2, pregnanacy and immobility induced     Past Surgical History  Procedure Laterality Date  . Bladder suspension  2003  . Abdominal hysterectomy  1991    secondary to abnormal PAPs  . Coronary angioplasty  2013    arteries were clean .  Dr. Laveda Norman at [facility]     History   Social History  . Marital Status: Divorced    Spouse Name: N/A    Number of Children: N/A  . Years of Education: N/A   Social History Main Topics  . Smoking status: Never Smoker   . Smokeless tobacco: Never Used  . Alcohol Use: No  . Drug Use: No  . Sexual Activity: None   Other Topics Concern  . 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     Family History  Problem Relation Age of Onset  . Asthma Mother   . Mental illness Mother     multi infarct dementia  . Stroke Mother   . Mental illness Father     alzheimers dementia  . Asthma Brother   . Asthma Son   . Cancer Maternal Aunt     colon ca  . Cancer Paternal Aunt     colon ca  . Cancer Maternal Grandmother  uterine ca    Medications and Allergies reviewed  Outpatient Prescriptions Prior to Visit  Medication Sig Dispense Refill  . Albuterol Sulfate (VENTOLIN HFA IN) Inhale 90 mcg into the lungs. 2 puffs every four hours as needed.      Marland Kitchen aspirin 81 MG tablet Take 81 mg by mouth daily.      Marland Kitchen dicyclomine (BENTYL) 10 MG capsule Take 1 capsule (10 mg total) by mouth 4 (four) times daily -  before meals and at bedtime.  90 capsule  2  . Fluticasone-Salmeterol (ADVAIR) 250-50 MCG/DOSE AEPB Inhale 1 puff into the lungs every 12 (twelve) hours.      . hydrochlorothiazide (HYDRODIURIL) 25 MG tablet Take 1 tablet (25 mg total) by mouth daily.  30 tablet  5  . hydrOXYzine (ATARAX/VISTARIL) 25 MG tablet Take 25 mg by mouth 3 (three) times daily as needed.      . Lactobacillus-Inulin (CULTURELLE DIGESTIVE HEALTH) CAPS Take 1 capsule by mouth daily.  14 capsule  0  . levothyroxine  (SYNTHROID, LEVOTHROID) 137 MCG tablet Take 1 tablet (137 mcg total) by mouth daily.  90 tablet  2  . lisinopril (PRINIVIL,ZESTRIL) 20 MG tablet Take 1 tablet (20 mg total) by mouth daily.  30 tablet  5  . metoprolol tartrate (LOPRESSOR) 25 MG tablet Take 1 tablet (25 mg total) by mouth 2 (two) times daily.  60 tablet  5  . mometasone (NASONEX) 50 MCG/ACT nasal spray Place 2 sprays into the nose daily.      . nitroGLYCERIN (NITROSTAT) 0.4 MG SL tablet Place 0.4 mg under the tongue every 5 (five) minutes as needed.      . pantoprazole (PROTONIX) 40 MG tablet Take 40 mg by mouth daily.      . simvastatin (ZOCOR) 40 MG tablet Take 40 mg by mouth every evening.      . ciprofloxacin (CIPRO) 250 MG tablet Take 1 tablet (250 mg total) by mouth 2 (two) times daily.  6 tablet  0   No facility-administered medications prior to visit.    Review of Systems:    GEN: No fevers, chills. Nontoxic. Primarily MSK c/o today. MSK: Detailed in the HPI GI: tolerating PO intake without difficulty Neuro: No numbness, parasthesias, or tingling associated. Otherwise, the pertinent positives and negatives are listed above and in the HPI, otherwise a full review of systems has been reviewed and is negative unless noted positive.    Physical Examination: Filed Vitals:   07/16/13 0904  BP: 110/70  Pulse: 60  Temp: 98.1 F (36.7 C)  TempSrc: Oral  Height: 4\' 11"  (1.499 m)  Weight: 146 lb (66.225 kg)      GEN: Well-developed,well-nourished,in no acute distress; alert,appropriate and cooperative throughout examination HEENT: Normocephalic and atraumatic without obvious abnormalities. Ears, externally no deformities PULM: Breathing comfortably in no respiratory distress EXT: No clubbing, cyanosis, or edema PSYCH: Normally interactive. Cooperative during the interview. Pleasant. Friendly and conversant. Not anxious or depressed appearing. Normal, full affect.  Range of motion at  the waist: Flexion:  normal Extension: normal Lateral bending: normal Rotation: all normal  No echymosis or edema Rises to examination table with no difficulty Gait: non antalgic  Inspection/Deformity: N Paraspinus Tenderness: minimal Mildtenderness, upper posterior pelvis  B Ankle Dorsiflexion (L5,4): 5/5 B Great Toe Dorsiflexion (L5,4): 5/5 Heel Walk (L5): WNL Toe Walk (S1): WNL Rise/Squat (L4): WNL  SENSORY B Medial Foot (L4): WNL B Dorsum (L5): WNL B Lateral (S1): WNL Light Touch: WNL Pinprick: WNL  REFLEXES Knee (L4): 2+ Ankle (S1): 2+  B SLR, seated: neg B SLR, supine: neg B FABER: neg B Reverse FABER: neg B Greater Troch: NT B Log Roll: neg B Sciatic Notch: NT   Knee:  bilateral Gait: notably antalgic, favors LEFT ROM: 0-120 on R, 0-110 on L Effusion: neg Echymosis or edema: none Patellar tendon NT Painful PLICA: neg Patellar grind: + on L Medial and lateral patellar facet loading: pos on L medial and lateral joint lines: medial > lateral on L Mcmurray's pain on L Flexion-pinch + on L Varus and valgus stress: opens more laterally on L, but Manning endpoint Lachman: neg Ant and Post drawer: neg Hip abduction, IR, ER: WNL Hip flexion str: 4/5 Hip abd: 4/5 Quad: 4/5 VMO atrophy: marked wasting on the LEFT compared to RIGHT Hamstring concentric and eccentric: 5/5   Dg Lumbar Spine Complete  07/09/2013   CLINICAL DATA:  Low back pain.  EXAM: LUMBAR SPINE - COMPLETE 4+ VIEW  COMPARISON:  None.  FINDINGS: There is no evidence of lumbar spine fracture. Alignment is normal. Mild degenerative disc disease is noted at L4-5.  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   Electronically Signed   By: Roque Lias M.D.   On: 07/09/2013 11:05   Dg Knee Ap/lat W/sunrise Left  07/16/2013   CLINICAL DATA:  Left-sided knee pain.  EXAM: DG KNEE - 3 VIEWS  COMPARISON:  No priors.  FINDINGS: Three views of the left knee demonstrate no acute displaced fracture, subluxation or  dislocation. Joint space narrowing, subchondral sclerosis, subchondral cyst formation and osteophyte formation are noted in a tricompartmental distribution, most pronounced in the medial and patellofemoral compartments, compatible with advanced osteoarthritis. Faint calcifications are noted with the menisci.)  IMPRESSION: 1. No acute radiographic abnormality of the left knee. 2. Advanced tricompartmental osteoarthritis, most severe in the medial and patellofemoral compartments. 3. Chondrocalcinosis.   Electronically Signed   By: Trudie Reed M.D.   On: 07/16/2013 10:50   Dg Knee Ap/lat W/sunrise Right  07/16/2013   CLINICAL DATA:  Right-sided knee pain.  EXAM: DG KNEE - 3 VIEWS  COMPARISON:  No priors.  FINDINGS: Multiple views of the right knee demonstrate no acute displaced fracture, subluxation, dislocation, or soft tissue abnormality.  IMPRESSION: No acute radiographic abnormality of the right knee.   Electronically Signed   By: Trudie Reed M.D.   On: 07/16/2013 10:58    All films independently reviewed, and L spine series is compatible with mild DDD at L4-5, but is otherwise fairly unremarkable.  Right knee is normal with no significant OA. L Knee WB shows moderate to severe tricompartmental OA without occult fracture or dislocation. Hannah Beat MD  Assessment and Plan:  Impression: 1. Moderate to severe OA of LEFT knee. Suspect significant driver for back pain, too, and altered gait. 2. Back pain with mild DDD - suspect #1 impacts this 3. R knee pain, minimal OA with some impact at PF joint  Recommendations: 1. Marked quad wasting on LEFT - PT, will need lifelong HEP. 2. Daily Mobic for pain control. Tylenol along with this. 3. Formal PT for #1 and spine stability program 4. Intrarticular steroid injection, L knee. If this is minimally helpful, hyaluronic acid trial next.  Knee Injection, LEFT Patient verbally consented to procedure. Risks (including potential rare risk of  infection), benefits, and alternatives explained. Sterilely prepped with Chloraprep. Ethyl cholride used for anesthesia. 8 cc Lidocaine 1% mixed with 2 cc of Depo-Medrol 40 mg  injected using the anterolateral approach without difficulty. No complications with procedure and tolerated well. Patient had decreased pain post-injection.   Meds ordered this encounter  Medications  . meloxicam (MOBIC) 15 MG tablet    Sig: Take 1 tablet (15 mg total) by mouth daily.    Dispense:  30 tablet    Refill:  2   We will see the patient back in 6 weeks  Thank you for having [place] see Christine Manning in consultation.  Feel free to contact me with any questions.  Signed,  Elpidio Galea. Jailee Jaquez, MD, CAQ Sports Medicine  [employer] at [facility] [address] [place], [place] [address] Phone: [phone] Fax: [phone]

## 2013-07-17 ENCOUNTER — Ambulatory Visit: Payer: Self-pay | Admitting: Internal Medicine

## 2013-07-24 ENCOUNTER — Encounter: Payer: Self-pay | Admitting: Internal Medicine

## 2013-08-06 ENCOUNTER — Encounter: Payer: Self-pay | Admitting: Internal Medicine

## 2013-08-13 ENCOUNTER — Ambulatory Visit (INDEPENDENT_AMBULATORY_CARE_PROVIDER_SITE_OTHER): Payer: Medicare Other | Admitting: Internal Medicine

## 2013-08-13 ENCOUNTER — Encounter: Payer: Self-pay | Admitting: Internal Medicine

## 2013-08-13 VITALS — BP 108/68 | HR 65 | Temp 98.1°F | Resp 14 | Ht 59.0 in | Wt 143.8 lb

## 2013-08-13 DIAGNOSIS — R109 Unspecified abdominal pain: Secondary | ICD-10-CM

## 2013-08-13 DIAGNOSIS — Z6825 Body mass index (BMI) 25.0-25.9, adult: Secondary | ICD-10-CM

## 2013-08-13 DIAGNOSIS — R079 Chest pain, unspecified: Secondary | ICD-10-CM

## 2013-08-13 DIAGNOSIS — I10 Essential (primary) hypertension: Secondary | ICD-10-CM

## 2013-08-13 DIAGNOSIS — E039 Hypothyroidism, unspecified: Secondary | ICD-10-CM

## 2013-08-13 DIAGNOSIS — Z862 Personal history of diseases of the blood and blood-forming organs and certain disorders involving the immune mechanism: Secondary | ICD-10-CM

## 2013-08-13 DIAGNOSIS — E663 Overweight: Secondary | ICD-10-CM

## 2013-08-13 DIAGNOSIS — M810 Age-related osteoporosis without current pathological fracture: Secondary | ICD-10-CM

## 2013-08-13 DIAGNOSIS — G8929 Other chronic pain: Secondary | ICD-10-CM

## 2013-08-13 DIAGNOSIS — Z0000 Encounter for general adult medical examination without abnormal findings: Secondary | ICD-10-CM

## 2013-08-13 DIAGNOSIS — Z90710 Acquired absence of both cervix and uterus: Secondary | ICD-10-CM

## 2013-08-13 DIAGNOSIS — R5381 Other malaise: Secondary | ICD-10-CM

## 2013-08-13 DIAGNOSIS — Z8639 Personal history of other endocrine, nutritional and metabolic disease: Secondary | ICD-10-CM

## 2013-08-13 DIAGNOSIS — Z1211 Encounter for screening for malignant neoplasm of colon: Secondary | ICD-10-CM

## 2013-08-13 DIAGNOSIS — E785 Hyperlipidemia, unspecified: Secondary | ICD-10-CM

## 2013-08-13 DIAGNOSIS — E559 Vitamin D deficiency, unspecified: Secondary | ICD-10-CM

## 2013-08-13 MED ORDER — ALBUTEROL SULFATE (2.5 MG/3ML) 0.083% IN NEBU: 2.5000 mg | INHALATION_SOLUTION | Freq: Four times a day (QID) | RESPIRATORY_TRACT | Status: DC | PRN

## 2013-08-13 MED ORDER — TETANUS-DIPHTH-ACELL PERTUSSIS 5-2.5-18.5 LF-MCG/0.5 IM SUSP: 0.5000 mL | Freq: Once | INTRAMUSCULAR | Status: DC

## 2013-08-13 MED ORDER — HYOSCYAMINE SULFATE 0.125 MG SL SUBL: 0.1250 mg | SUBLINGUAL_TABLET | SUBLINGUAL | Status: DC | PRN

## 2013-08-13 MED ORDER — ZOSTER VACCINE LIVE 19400 UNT/0.65ML ~~LOC~~ SOLR: 0.6500 mL | Freq: Once | SUBCUTANEOUS | Status: DC

## 2013-08-13 MED ORDER — FLUTICASONE-SALMETEROL 250-50 MCG/DOSE IN AEPB: 1.0000 | INHALATION_SPRAY | Freq: Two times a day (BID) | RESPIRATORY_TRACT | Status: DC

## 2013-08-13 NOTE — Patient Instructions (Addendum)
You had your annual Medicare wellness exam today  We stopped your nitroglycerin tables.  You do not have  Heart disease .  You can try the hyoscyamine  sublingula tablets for your next episode of chest pain   You have been on Boniva for too long!  Please stop it immediately. It may be causing your chest pain .  We will schedule your bone density test this month and repeat it in one year to see if it has changed.  Referral to Dr. Lafe Garin in process (endocrinology)  To follow u on this and your thyroid nodules.   Please use the stool kit to send [place] back a sample to test for blood.  This is your colon CA screening test.   You need to have a TDaP vaccine and a Shingles vaccine.  I have given you prescriptions for these because they will be cheaper at the health Dept or at your local pharmacy because Medicare will not reimburse for them.   Please make sure you are fasting for your lab visit next week. (nothing but water since MN;  Ok to take your medications)   We will contact you with the bloodwork results  Managing Your High Blood Pressure Blood pressure is a measurement of how forceful your blood is pressing against the walls of the arteries. Arteries are muscular tubes within the circulatory system. Blood pressure does not stay the same. Blood pressure rises when you are active, excited, or nervous; and it lowers during sleep and relaxation. If the numbers measuring your blood pressure stay above normal most of the time, you are at risk for health problems. High blood pressure (hypertension) is a long-term (chronic) condition in which blood pressure is elevated. A blood pressure reading is recorded as two numbers, such as 120 over 80 (or 120/80). The first, higher number is called the systolic pressure. It is a measure of the pressure in your arteries as the heart beats. The second, lower number is called the diastolic pressure. It is a measure of the pressure in your arteries as the heart relaxes  between beats.  Keeping your blood pressure in a normal range is important to your overall health and prevention of health problems, such as heart disease and stroke. When your blood pressure is uncontrolled, your heart has to work harder than normal. High blood pressure is a very common condition in adults because blood pressure tends to rise with age. Men and women are equally likely to have hypertension but at different times in life. Before age 76, men are more likely to have hypertension. After 66 years of age, women are more likely to have it. Hypertension is especially common in African Americans. This condition often has no signs or symptoms. The cause of the condition is usually not known. Your caregiver can help you come up with a plan to keep your blood pressure in a normal, healthy range. BLOOD PRESSURE STAGES Blood pressure is classified into four stages: normal, prehypertension, stage 1, and stage 2. Your blood pressure reading will be used to determine what type of treatment, if any, is necessary. Appropriate treatment options are tied to these four stages:  Normal  Systolic pressure (mm Hg): below 120.  Diastolic pressure (mm Hg): below 80. Prehypertension  Systolic pressure (mm Hg): 120 to 139.  Diastolic pressure (mm Hg): 80 to 89. Stage1  Systolic pressure (mm Hg): 140 to 159.  Diastolic pressure (mm Hg): 90 to 99. Stage2  Systolic pressure (mm Hg): 160  or above.  Diastolic pressure (mm Hg): 100 or above. RISKS RELATED TO HIGH BLOOD PRESSURE Managing your blood pressure is an important responsibility. Uncontrolled high blood pressure can lead to:  A heart attack.  A stroke.  A weakened blood vessel (aneurysm).  Heart failure.  Kidney damage.  Eye damage.  Metabolic syndrome.  Memory and concentration problems. HOW TO MANAGE YOUR BLOOD PRESSURE Blood pressure can be managed effectively with lifestyle changes and medicines (if needed). Your caregiver will  help you come up with a plan to bring your blood pressure within a normal range. Your plan should include the following: Education  Read all information provided by your caregivers about how to control blood pressure.  Educate yourself on the latest guidelines and treatment recommendations. New research is always being done to further define the risks and treatments for high blood pressure. Lifestylechanges  Control your weight.  Avoid smoking.  Stay physically active.  Reduce the amount of salt in your diet.  Reduce stress.  Control any chronic conditions, such as high cholesterol or diabetes.  Reduce your alcohol intake. Medicines  Several medicines (antihypertensive medicines) are available, if needed, to bring blood pressure within a normal range. Communication  Review all the medicines you take with your caregiver because there may be side effects or interactions.  Talk with your caregiver about your diet, exercise habits, and other lifestyle factors that may be contributing to high blood pressure.  See your caregiver regularly. Your caregiver can help you create and adjust your plan for managing high blood pressure. RECOMMENDATIONS FOR TREATMENT AND FOLLOW-UP  The following recommendations are based on current guidelines for managing high blood pressure in nonpregnant adults. Use these recommendations to identify the proper follow-up period or treatment option based on your blood pressure reading. You can discuss these options with your caregiver.  Systolic pressure of 120 to 139 or diastolic pressure of 80 to 89: Follow up with your caregiver as directed.  Systolic pressure of 140 to 160 or diastolic pressure of 90 to 100: Follow up with your caregiver within 2 months.  Systolic pressure above 160 or diastolic pressure above 100: Follow up with your caregiver within 1 month.  Systolic pressure above 180 or diastolic pressure above 110: Consider antihypertensive  therapy; follow up with your caregiver within 1 week.  Systolic pressure above 200 or diastolic pressure above 120: Begin antihypertensive therapy; follow up with your caregiver within 1 week. Document Released: 05/31/2012 Document Reviewed: 05/31/2012 [employer] Patient Information 2014 [place], [place].

## 2013-08-13 NOTE — Assessment & Plan Note (Signed)
Annual comprehensive exam was done including breast, excluding pelvic and PAP smear. All screenings have been addressed .  

## 2013-08-13 NOTE — Assessment & Plan Note (Addendum)
Has taken oral Boniva since 1998.  Her chest pain may be esophagitis.  She has been asked to stop taking it. She had prior Prior treatment with  [facility] for one year .  Needs DEXA scan adn Endocrine referral for other options.

## 2013-08-13 NOTE — Progress Notes (Signed)
Patient ID: Christine Manning, female   DOB: [birth year], 67 y.o.   MRN: [id number]  The patient is here for annual Medicare wellness examination and management of other chronic and acute problems.   She has was last seen a little over a month ago for persistent suprapubic pain and bilateral lower quadrant discomfort.  Was sent for CT of abd/pelvis and CA 125 to rule out ovarian CA.    New complaint today is persistent ss chest pain for the last 3 days .  The pain is chronic intermittent and described as a feeling of  pressure .  She has  had prior episodes with at least 3  Hospitalizations for same. Two at [facility]  to rule out cardiac issues.  Underwent a Stress test which as negative, and finally a  cardiac cath 3 years ago at [facility] which according to patient showed clear coronary arteries. .  History of osteoporosis,  Has been taking oral Boniva  For over 12 years.  Was prescribed sl nitroglycerin a long time ago for episodes of chest pain.  The pain is not relieved by NTG. Marland Kitchen   Overweight:  She has lost 9 lbs by her scales by following a healthy diet and walking daily . Had her mammogram which was normal.  S/p hysterectomy for nonmalignant reasons.  History of colon Polyps:  She was referred to GI for colonoscopy, sicne she was not sure when her last colonoscopy was done, and was told she was not due until 2017.     The risk factors are reflected in the social history.  The roster of all physicians providing medical care to patient - is listed in the Snapshot section of the chart.  Activities of daily living:  The patient is 100% independent in all ADLs: dressing, toileting, feeding as well as independent mobility  Home safety : The patient has smoke detectors in the home. They wear seatbelts.  There are no firearms at home. There is no violence in the home.   There is no risks for hepatitis, STDs or HIV. There is no   history of blood transfusion. They have no travel history to infectious disease  endemic areas of the world.  The patient has seen their dentist in the last six month. They have seen their eye doctor in the last year. They admit to slight hearing difficulty with regard to whispered voices and some television programs.  They have deferred audiologic testing in the last year.  They do not  have excessive sun exposure. Discussed the need for sun protection: hats, long sleeves and use of sunscreen if there is significant sun exposure.   Diet: the importance of a healthy diet is discussed. They do have a healthy diet.  The benefits of regular aerobic exercise were discussed. She walks 4 times per week ,  20 minutes.   Depression screen: there are no signs or vegative symptoms of depression- irritability, change in appetite, anhedonia, sadness/tearfullness.  Cognitive assessment: the patient manages all their financial and personal affairs and is actively engaged. They could relate day,date,year and events; recalled 2/3 objects at 3 minutes; performed clock-face test normally.  The following portions of the patient's history were reviewed and updated as appropriate: allergies, current medications, past family history, past medical history,  past surgical history, past social history  and problem list.  Visual acuity was not assessed per patient preference since she has regular follow up with her ophthalmologist. Hearing and body mass index were assessed and  reviewed.   During the course of the visit the patient was educated and counseled about appropriate screening and preventive services including : fall prevention , diabetes screening, nutrition counseling, colorectal cancer screening, and recommended immunizations.    Objective:  BP 108/68  Pulse 65  Temp(Src) 98.1 F (36.7 C) (Oral)  Resp 14  Ht 4\' 11"  (1.499 m)  Wt 143 lb 12 oz (65.205 kg)  BMI 29.02 kg/m2  SpO2 95%  General Appearance:    Alert, cooperative, no distress, appears stated age  Head:    Normocephalic,  without obvious abnormality, atraumatic  Eyes:    PERRL, conjunctiva/corneas clear, EOM's intact, fundi    benign, both eyes  Ears:    Normal TM's and external ear canals, both ears  Nose:   Nares normal, septum midline, mucosa normal, no drainage    or sinus tenderness  Throat:   Lips, mucosa, and tongue normal; teeth and gums normal  Neck:   Supple, symmetrical, trachea midline, no adenopathy;    thyroid:  Diffuse enlargement, no tenderness/nodules; no carotid   bruit or JVD  Back:     Symmetric, no curvature, ROM normal, no CVA tenderness  Lungs:     Clear to auscultation bilaterally, respirations unlabored  Chest Wall:    No tenderness or deformity   Heart:    Regular rate and rhythm, S1 and S2 normal, no murmur, rub   or gallop  Breast Exam:    No tenderness, masses, or nipple abnormality  Abdomen:     Soft, non-tender, bowel sounds active all four quadrants,    no masses, no organomegaly  Genitalia:    Deferred per patient request  Rectal:    Deferred per patient request   guaiac negative stool  Extremities:   Extremities normal, atraumatic, no cyanosis or edema  Pulses:   2+ and symmetric all extremities  Skin:   Skin color, texture, turgor normal, no rashes or lesions  Lymph nodes:   Cervical, supraclavicular, and axillary nodes normal  Neurologic:   CNII-XII intact, normal strength, sensation and reflexes    throughout   Assessment and Plan:  Postmenopausal osteoporosis Has taken oral Boniva since 1998.  Her chest pain may be esophagitis.  She has been asked to stop taking it. She had prior Prior treatment with  [facility] for one year .  Needs DEXA scan adn Endocrine referral for other options.   Routine general medical examination at a health care facility Annual comprehensive exam was done including breast, excluding pelvic and PAP smear. All screenings have been addressed .   Overweight (BMI 25.0-29.9) I have addressed  BMI and congratulated hr on the 9 lb loss reported by  patietn thus far.. I have  recommended wt loss of 10% of body weigh over the next 6 months using a low glycemic index diet and regular exercise a minimum of 5 days per week.  She is limited by her joint pain secondary to OA.    Unspecified hypothyroidism Prior referral to [facility] for thyroid nodules and uptake scan , records not available. TSH was 105 when she established care with me,  Synthroid was started  and repeat was suppressed a few weeks ago to  0.5 after being lost to follow up.  Dose was reduced and she will have a repeat TSH and Free T4 next week. Ultrasound ordered to dtermine if there are nodules present; Endocrine referral underway  S/P hysterectomy with oophorectomy She is not sexually active and does not need  PAP smears.  Pelvic exam was deferred today.  Remaining ovary was not seen on recent pelvic CT  Chest pain, unspecified Her pain is atypical ,, never occurs with exercise,  And was evaluated fully for angina with cardiac cath at [facility]  In 2011.  Likely due to pill esophagitis from [facility].  Continue PPI and add hysocyamine. Boniva stopped.  History of thyroid nodule prior FNA was done by [facility] many years ago after referral by former PCP [facility] in 1998and was reportedly normal in  2007 Prior uptake scan was also done but record have not been received  .  Repeating ultrasound and referral to [facility] Endocrine.    Hypertension Well controlled on current regimen. Renal function Manning, no changes today.  Lab Results  Component Value Date   CREATININE 1.0 07/05/2013    Hyperlipidemia Has been taking simvastatin,  No recent fasting lipids since thyroid was so underactive/overactive . Scheduled for next week . LFTs normal last month . Lab Results  Component Value Date   ALT 12 07/05/2013   AST 18 07/05/2013   ALKPHOS 53 07/05/2013   BILITOT 0.4 07/05/2013    Chronic suprapubic pain Likely secondary to IBS. UA, CT scan and CA 125 were done last month and no  evidence of UTI, ovarian Ca , diverticulitis or nephrolithiasis was found.  Reassurance provided.    Updated Medication List Outpatient Encounter Prescriptions as of 08/13/2013  Medication Sig  . Albuterol Sulfate (VENTOLIN HFA IN) Inhale 90 mcg into the lungs. 2 puffs every four hours as needed.  Marland Kitchen aspirin 81 MG tablet Take 81 mg by mouth daily.  Marland Kitchen dicyclomine (BENTYL) 10 MG capsule Take 1 capsule (10 mg total) by mouth 4 (four) times daily -  before meals and at bedtime.  . Fluticasone-Salmeterol (ADVAIR) 250-50 MCG/DOSE AEPB Inhale 1 puff into the lungs every 12 (twelve) hours.  . hydrochlorothiazide (HYDRODIURIL) 25 MG tablet Take 1 tablet (25 mg total) by mouth daily.  . hydrOXYzine (ATARAX/VISTARIL) 25 MG tablet Take 25 mg by mouth 3 (three) times daily as needed.  . Lactobacillus-Inulin (CULTURELLE DIGESTIVE HEALTH) CAPS Take 1 capsule by mouth daily.  Marland Kitchen levothyroxine (SYNTHROID, LEVOTHROID) 137 MCG tablet Take 1 tablet (137 mcg total) by mouth daily.  Marland Kitchen lisinopril (PRINIVIL,ZESTRIL) 20 MG tablet Take 1 tablet (20 mg total) by mouth daily.  . meloxicam (MOBIC) 15 MG tablet Take 1 tablet (15 mg total) by mouth daily.  . metoprolol tartrate (LOPRESSOR) 25 MG tablet Take 1 tablet (25 mg total) by mouth 2 (two) times daily.  . mometasone (NASONEX) 50 MCG/ACT nasal spray Place 2 sprays into the nose daily.  . pantoprazole (PROTONIX) 40 MG tablet Take 40 mg by mouth daily.  . simvastatin (ZOCOR) 40 MG tablet Take 40 mg by mouth every evening.  . [DISCONTINUED] Fluticasone-Salmeterol (ADVAIR) 250-50 MCG/DOSE AEPB Inhale 1 puff into the lungs every 12 (twelve) hours.  . [DISCONTINUED] nitroGLYCERIN (NITROSTAT) 0.4 MG SL tablet Place 0.4 mg under the tongue every 5 (five) minutes as needed.  Marland Kitchen albuterol (PROVENTIL) (2.5 MG/3ML) 0.083% nebulizer solution Take 3 mLs (2.5 mg total) by nebulization every 6 (six) hours as needed for wheezing or shortness of breath.  . hyoscyamine (LEVSIN SL) 0.125 MG  SL tablet Place 1 tablet (0.125 mg total) under the tongue every 4 (four) hours as needed for cramping.  . Tdap (BOOSTRIX) 5-2.5-18.5 LF-MCG/0.5 injection Inject 0.5 mLs into the muscle once.  . zoster vaccine live, PF, (ZOSTAVAX) 16109 UNT/0.65ML injection  Inject 19,400 Units into the skin once.

## 2013-08-14 DIAGNOSIS — E663 Overweight: Secondary | ICD-10-CM | POA: Insufficient documentation

## 2013-08-14 DIAGNOSIS — R079 Chest pain, unspecified: Secondary | ICD-10-CM | POA: Insufficient documentation

## 2013-08-14 NOTE — Assessment & Plan Note (Signed)
She is not sexually active and does not need PAP smears.  Pelvic exam was deferred today.  Remaining ovary was not seen on recent pelvic CT

## 2013-08-14 NOTE — Assessment & Plan Note (Addendum)
Has been taking simvastatin,  No recent fasting lipids since thyroid was so underactive/overactive . Scheduled for next week . LFTs normal last month . Lab Results  Component Value Date   ALT 12 07/05/2013   AST 18 07/05/2013   ALKPHOS 53 07/05/2013   BILITOT 0.4 07/05/2013

## 2013-08-14 NOTE — Assessment & Plan Note (Signed)
Well controlled on current regimen. Renal function stable, no changes today.  Lab Results  Component Value Date   CREATININE 1.0 07/05/2013

## 2013-08-14 NOTE — Assessment & Plan Note (Addendum)
prior FNA was done by [facility] many years ago after referral by former PCP [facility] in 1998and was reportedly normal in  2007 Prior uptake scan was also done but record have not been received  .  Repeating ultrasound and referral to [facility] Endocrine.

## 2013-08-14 NOTE — Assessment & Plan Note (Signed)
Likely secondary to IBS. UA, CT scan and CA 125 were done last month and no evidence of UTI, ovarian Ca , diverticulitis or nephrolithiasis was found.  Reassurance provided.

## 2013-08-14 NOTE — Assessment & Plan Note (Signed)
I have addressed  BMI and congratulated hr on the 9 lb loss reported by patietn thus far.. I have  recommended wt loss of 10% of body weigh over the next 6 months using a low glycemic index diet and regular exercise a minimum of 5 days per week.  She is limited by her joint pain secondary to OA.

## 2013-08-14 NOTE — Assessment & Plan Note (Signed)
Her pain is atypical ,, never occurs with exercise,  And was evaluated fully for angina with cardiac cath at [facility]  In 2011.  Likely due to pill esophagitis from [facility].  Continue PPI and add hysocyamine. Boniva stopped.

## 2013-08-14 NOTE — Assessment & Plan Note (Signed)
Prior referral to [facility] for thyroid nodules and uptake scan , records not available. TSH was 105 when she established care with me,  Synthroid was started  and repeat was suppressed a few weeks ago to  0.5 after being lost to follow up.  Dose was reduced and she will have a repeat TSH and Free T4 next week. Ultrasound ordered to dtermine if there are nodules present; Endocrine referral underway

## 2013-08-20 ENCOUNTER — Ambulatory Visit: Payer: Self-pay | Admitting: Internal Medicine

## 2013-08-22 ENCOUNTER — Telehealth: Payer: Self-pay | Admitting: Internal Medicine

## 2013-08-22 DIAGNOSIS — Z8639 Personal history of other endocrine, nutritional and metabolic disease: Secondary | ICD-10-CM

## 2013-08-22 NOTE — Telephone Encounter (Signed)
Called and advised patient of results, verbalized understanding. Appt with Dr. Lafe Garin not until 10/10/13

## 2013-08-22 NOTE — Assessment & Plan Note (Signed)
Repeat ultrasound done at [facility] shows right thyorid nodule 4 cm in diameter.  Biopsy recommended. She is scheduled  To see Dr Lafe Garin enodcrinologist soon.

## 2013-08-22 NOTE — Telephone Encounter (Signed)
Repeat ultrasound done at [facility] shows right thyorid nodule 4 cm in diameter.  Biopsy recommended. Confirm that She is scheduled  To see Dr Lafe Garin enodcrinologist soon.

## 2013-08-27 ENCOUNTER — Other Ambulatory Visit (INDEPENDENT_AMBULATORY_CARE_PROVIDER_SITE_OTHER): Payer: Medicare Other

## 2013-08-27 ENCOUNTER — Ambulatory Visit (INDEPENDENT_AMBULATORY_CARE_PROVIDER_SITE_OTHER): Payer: Medicare Other | Admitting: Family Medicine

## 2013-08-27 ENCOUNTER — Encounter: Payer: Self-pay | Admitting: Family Medicine

## 2013-08-27 VITALS — BP 130/76 | HR 63 | Temp 97.9°F | Ht 59.0 in | Wt 147.2 lb

## 2013-08-27 DIAGNOSIS — E559 Vitamin D deficiency, unspecified: Secondary | ICD-10-CM

## 2013-08-27 DIAGNOSIS — E039 Hypothyroidism, unspecified: Secondary | ICD-10-CM

## 2013-08-27 DIAGNOSIS — M25569 Pain in unspecified knee: Secondary | ICD-10-CM

## 2013-08-27 DIAGNOSIS — M25561 Pain in right knee: Secondary | ICD-10-CM

## 2013-08-27 DIAGNOSIS — R5381 Other malaise: Secondary | ICD-10-CM

## 2013-08-27 DIAGNOSIS — E785 Hyperlipidemia, unspecified: Secondary | ICD-10-CM

## 2013-08-27 DIAGNOSIS — M1712 Unilateral primary osteoarthritis, left knee: Secondary | ICD-10-CM

## 2013-08-27 DIAGNOSIS — IMO0002 Reserved for concepts with insufficient information to code with codable children: Secondary | ICD-10-CM

## 2013-08-27 DIAGNOSIS — M545 Low back pain: Secondary | ICD-10-CM

## 2013-08-27 DIAGNOSIS — M1710 Unilateral primary osteoarthritis, unspecified knee: Secondary | ICD-10-CM

## 2013-08-27 LAB — COMPREHENSIVE METABOLIC PANEL
ALT: 13 U/L (ref 0–35)
AST: 20 U/L (ref 0–37)
Alkaline Phosphatase: 53 U/L (ref 39–117)
BUN: 15 mg/dL (ref 6–23)
Chloride: 103 mEq/L (ref 96–112)
Creatinine, Ser: 1.1 mg/dL (ref 0.4–1.2)
Glucose, Bld: 81 mg/dL (ref 70–99)
Total Bilirubin: 0.4 mg/dL (ref 0.3–1.2)

## 2013-08-27 LAB — CBC WITH DIFFERENTIAL/PLATELET
Basophils Relative: 1.2 % (ref 0.0–3.0)
Eosinophils Absolute: 0.1 10*3/uL (ref 0.0–0.7)
Eosinophils Relative: 2.1 % (ref 0.0–5.0)
HCT: 34.9 % — ABNORMAL LOW (ref 36.0–46.0)
Hemoglobin: 11.8 g/dL — ABNORMAL LOW (ref 12.0–15.0)
Lymphocytes Relative: 41.1 % (ref 12.0–46.0)
Lymphs Abs: 1.9 10*3/uL (ref 0.7–4.0)
MCHC: 33.8 g/dL (ref 30.0–36.0)
MCV: 92.6 fl (ref 78.0–100.0)
Monocytes Absolute: 0.2 10*3/uL (ref 0.1–1.0)
Neutro Abs: 2.3 10*3/uL (ref 1.4–7.7)
Neutrophils Relative %: 50.2 % (ref 43.0–77.0)
RBC: 3.77 Mil/uL — ABNORMAL LOW (ref 3.87–5.11)
WBC: 4.6 10*3/uL (ref 4.5–10.5)

## 2013-08-27 LAB — LDL CHOLESTEROL, DIRECT: Direct LDL: 160.1 mg/dL

## 2013-08-27 LAB — LIPID PANEL
Cholesterol: 234 mg/dL — ABNORMAL HIGH (ref 0–200)
HDL: 43.1 mg/dL (ref >39.00)
Triglycerides: 202 mg/dL — ABNORMAL HIGH (ref 0.0–149.0)
VLDL: 40.4 mg/dL — ABNORMAL HIGH (ref 0.0–40.0)

## 2013-08-27 LAB — TSH: TSH: 0.17 u[IU]/mL — ABNORMAL LOW (ref 0.35–5.50)

## 2013-08-27 LAB — T4, FREE: Free T4: 1.07 ng/dL (ref 0.60–1.60)

## 2013-08-27 NOTE — Progress Notes (Signed)
Date:  08/27/2013   Name:  Christine Manning   DOB:  1946-03-14   MRN:  [id number] Gender: female Age: 67 y.o.  Primary Physician: Duncan Dull, MD  F/u B knee pain and back pain: this is a patient with severe LEFT knee osteoarthritis, tricompartmental, as well as some degenerative disc disease who presented a last office visit with significant LEFT knee pain, and RIGHT knee pain to a lesser extent as well as some back pain. She is doing quite a bit better today. She has been working on her strengthening, and think she is making some significant progress p.o. She is having a good response from that her intra-articular knee injection. We also place her on some regular NSAIDs at her last office visit   07/16/2013 OV: She describes a distant L knee arthroscopy done at [facility] in 1999 by an unknown MD with what sounds like a partial or complete menisectomy and chondroplasty of 1 or more of her articular surfaces.   Pain is really bad and particularly with walking. (mostly at left knee) Pain with sleeping and when getting up in the low back. Sometimes in middle of back.  Describes a stabbing pain. More in the last few months.  No bowel or bladder incontinence. No numbness or tingling. It never goes beyond her upper thighs.   Retired now.  Tries to walk some.  Able to do only walking Used to go to the gym 5 days a week  In the lower leg, pain seems to come from her knees. Patient presents with multi-year h/o L sided knee pain after no injury. No audible pop was heard. The patient has not had an effusion. Occ symptomatic giving-way. No mechanical clicking. Joint has locked up or caught intermittently. Patient has been able to walk but is limping. The patient does have pain going up and down stairs or rising from a seated position.   Pain location: anterior and medial more Current physical activity: minimal Prior Knee Surgery: L arthroscopy Current pain meds: tylenol prn Bracing: none   Past  Medical History  Diagnosis Date  . Hyperlipidemia   . Hypertension   . Asthma   . Thyroid disease   . Personal history of DVT (deep vein thrombosis) 1980    x 2, pregnanacy and immobility induced     Past Surgical History  Procedure Laterality Date  . Bladder suspension  2003  . Abdominal hysterectomy  1991    secondary to abnormal PAPs  . Coronary angioplasty  2013    arteries were clean .  Dr. Laveda Norman at [facility]     History   Social History  . Marital Status: Divorced    Spouse Name: N/A    Number of Children: N/A  . Years of Education: N/A   Social History Main Topics  . Smoking status: Never Smoker   . Smokeless tobacco: Never Used  . Alcohol Use: No  . Drug Use: No  . Sexual Activity: None   Other Topics Concern  . 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     Family History  Problem Relation Age of Onset  . Asthma Mother   . Mental illness Mother     multi infarct dementia  . Stroke Mother   . Mental illness Father     alzheimers dementia  . Asthma Brother   . Asthma Son   . Cancer Maternal Aunt     colon ca  .  Cancer Paternal Aunt     colon ca  . Cancer Maternal Grandmother     uterine ca    Medications and Allergies reviewed  Outpatient Prescriptions Prior to Visit  Medication Sig Dispense Refill  . albuterol (PROVENTIL) (2.5 MG/3ML) 0.083% nebulizer solution Take 3 mLs (2.5 mg total) by nebulization every 6 (six) hours as needed for wheezing or shortness of breath.  150 mL  1  . Albuterol Sulfate (VENTOLIN HFA IN) Inhale 90 mcg into the lungs. 2 puffs every four hours as needed.      Marland Kitchen aspirin 81 MG tablet Take 81 mg by mouth daily.      Marland Kitchen dicyclomine (BENTYL) 10 MG capsule Take 1 capsule (10 mg total) by mouth 4 (four) times daily -  before meals and at bedtime.  90 capsule  2  . Fluticasone-Salmeterol (ADVAIR) 250-50 MCG/DOSE AEPB Inhale 1 puff into the lungs every 12 (twelve) hours.  60 each  6  .  hydrochlorothiazide (HYDRODIURIL) 25 MG tablet Take 1 tablet (25 mg total) by mouth daily.  30 tablet  5  . hydrOXYzine (ATARAX/VISTARIL) 25 MG tablet Take 25 mg by mouth 3 (three) times daily as needed.      . hyoscyamine (LEVSIN SL) 0.125 MG SL tablet Place 1 tablet (0.125 mg total) under the tongue every 4 (four) hours as needed for cramping.  30 tablet  3  . Lactobacillus-Inulin (CULTURELLE DIGESTIVE HEALTH) CAPS Take 1 capsule by mouth daily.  14 capsule  0  . lisinopril (PRINIVIL,ZESTRIL) 20 MG tablet Take 1 tablet (20 mg total) by mouth daily.  30 tablet  5  . meloxicam (MOBIC) 15 MG tablet Take 1 tablet (15 mg total) by mouth daily.  30 tablet  2  . metoprolol tartrate (LOPRESSOR) 25 MG tablet Take 1 tablet (25 mg total) by mouth 2 (two) times daily.  60 tablet  5  . mometasone (NASONEX) 50 MCG/ACT nasal spray Place 2 sprays into the nose daily.      . pantoprazole (PROTONIX) 40 MG tablet Take 40 mg by mouth daily.      . simvastatin (ZOCOR) 40 MG tablet Take 40 mg by mouth every evening.      . Tdap (BOOSTRIX) 5-2.5-18.5 LF-MCG/0.5 injection Inject 0.5 mLs into the muscle once.  0.5 mL  0  . zoster vaccine live, PF, (ZOSTAVAX) [address] UNT/0.65ML injection Inject 19,400 Units into the skin once.  1 each  0  . levothyroxine (SYNTHROID, LEVOTHROID) 137 MCG tablet Take 1 tablet (137 mcg total) by mouth daily.  90 tablet  2   No facility-administered medications prior to visit.    Review of Systems:    GEN: No fevers, chills. Nontoxic. Primarily MSK c/o today. MSK: Detailed in the HPI GI: tolerating PO intake without difficulty Neuro: No numbness, parasthesias, or tingling associated. Otherwise, the pertinent positives and negatives are listed above and in the HPI, otherwise a full review of systems has been reviewed and is negative unless noted positive.    Physical Examination: Filed Vitals:   08/27/13 0938  BP: 130/76  Pulse: 63  Temp: 97.9 F (36.6 C)  TempSrc: Oral  Height: 4'  11" (1.499 m)  Weight: 147 lb 4 oz (66.792 kg)      GEN: Well-developed,well-nourished,in no acute distress; alert,appropriate and cooperative throughout examination HEENT: Normocephalic and atraumatic without obvious abnormalities. Ears, externally no deformities PULM: Breathing comfortably in no respiratory distress EXT: No clubbing, cyanosis, or edema PSYCH: Normally interactive. Cooperative during the  interview. Pleasant. Friendly and conversant. Not anxious or depressed appearing. Normal, full affect.  Range of motion at  the waist: Flexion: normal Extension: normal Lateral bending: normal Rotation: all normal  No echymosis or edema Rises to examination table with no difficulty Gait: non antalgic  Inspection/Deformity: N Paraspinus Tenderness: minimal Mildtenderness, upper posterior pelvis  B Ankle Dorsiflexion (L5,4): 5/5 B Great Toe Dorsiflexion (L5,4): 5/5 Heel Walk (L5): WNL Toe Walk (S1): WNL Rise/Squat (L4): WNL  SENSORY B Medial Foot (L4): WNL B Dorsum (L5): WNL B Lateral (S1): WNL Light Touch: WNL Pinprick: WNL  REFLEXES Knee (L4): 2+ Ankle (S1): 2+  B SLR, seated: neg B SLR, supine: neg B FABER: neg B Reverse FABER: neg B Greater Troch: NT B Log Roll: neg B Sciatic Notch: NT   Knee:  bilateral Gait: MUCH IMPROVED, MILD LIMP ROM: 0-120 on R, 0-120 on L Effusion: neg Echymosis or edema: none Patellar tendon NT Painful PLICA: neg Patellar grind: + on L Medial and lateral patellar facet loading: pos on L medial and lateral joint lines: medial > lateral on L Mcmurray's pain on L Flexion-pinch + on L Varus and valgus stress: opens more laterally on L, but Manning endpoint Lachman: neg Ant and Post drawer: neg Hip abduction, IR, ER: WNL Hip flexion str: 4+/5 Hip abd: 4+/5 Quad: 4+/5 VMO atrophy: marked wasting on the LEFT compared to RIGHT Hamstring concentric and eccentric: 5/5   Dg Lumbar Spine Complete  07/09/2013   CLINICAL DATA:   Low back pain.  EXAM: LUMBAR SPINE - COMPLETE 4+ VIEW  COMPARISON:  None.  FINDINGS: There is no evidence of lumbar spine fracture. Alignment is normal. Mild degenerative disc disease is noted at L4-5.  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   Electronically Signed   By: Roque Lias M.D.   On: 07/09/2013 11:05   Dg Knee Ap/lat W/sunrise Left  07/16/2013   CLINICAL DATA:  Left-sided knee pain.  EXAM: DG KNEE - 3 VIEWS  COMPARISON:  No priors.  FINDINGS: Three views of the left knee demonstrate no acute displaced fracture, subluxation or dislocation. Joint space narrowing, subchondral sclerosis, subchondral cyst formation and osteophyte formation are noted in a tricompartmental distribution, most pronounced in the medial and patellofemoral compartments, compatible with advanced osteoarthritis. Faint calcifications are noted with the menisci.)  IMPRESSION: 1. No acute radiographic abnormality of the left knee. 2. Advanced tricompartmental osteoarthritis, most severe in the medial and patellofemoral compartments. 3. Chondrocalcinosis.   Electronically Signed   By: Trudie Reed M.D.   On: 07/16/2013 10:50   Dg Knee Ap/lat W/sunrise Right  07/16/2013   CLINICAL DATA:  Right-sided knee pain.  EXAM: DG KNEE - 3 VIEWS  COMPARISON:  No priors.  FINDINGS: Multiple views of the right knee demonstrate no acute displaced fracture, subluxation, dislocation, or soft tissue abnormality.  IMPRESSION: No acute radiographic abnormality of the right knee.   Electronically Signed   By: Trudie Reed M.D.   On: 07/16/2013 10:58     Assessment and Plan:  Impression: 1. Moderate to severe OA of LEFT knee. Suspect significant driver for back pain, too, and altered gait. This is significantly better compared to her prior office visit, status post intra-articular knee injection. 2. Back pain with mild DDD - suspect #1 impacts this. Improving. 3. R knee pain, minimal OA with some impact at PF joint. Also  improving.  Recommendations: 1. Marked quad wasting on LEFT - PT, will need lifelong HEP. Strength appears to  be improved, but this will take months to equal to contralateral side. 2. Daily Mobic for pain control. Tylenol along with this. 3. Formal PT for #1 and spine stability program. She seems to be quite compliant with this.  At this point, this is all I would do, and continue strengthening the LEFT quadricep. Continue NSAIDs, Tylenol, intermittent icing. If he has arthritic flares on the LEFT knee, we certainly could inject that with either corticosteroid or hyaluronic acid again in the future.  Signed,  Elpidio Galea. Rosemond Lyttle, MD, CAQ Sports Medicine  [facility] at [facility] [address] [place], [place] [address] Phone: [phone] Fax: [phone]

## 2013-08-27 NOTE — Progress Notes (Signed)
Pre-visit discussion using our clinic review tool. No additional management support is needed unless otherwise documented below in the visit note.  

## 2013-08-28 ENCOUNTER — Other Ambulatory Visit: Payer: Self-pay | Admitting: Internal Medicine

## 2013-08-28 ENCOUNTER — Encounter: Payer: Self-pay | Admitting: *Deleted

## 2013-08-28 DIAGNOSIS — E039 Hypothyroidism, unspecified: Secondary | ICD-10-CM

## 2013-08-28 DIAGNOSIS — E559 Vitamin D deficiency, unspecified: Secondary | ICD-10-CM | POA: Insufficient documentation

## 2013-08-28 DIAGNOSIS — E079 Disorder of thyroid, unspecified: Secondary | ICD-10-CM

## 2013-08-28 MED ORDER — LEVOTHYROXINE SODIUM 125 MCG PO TABS: 137.0000 ug | ORAL_TABLET | Freq: Every day | ORAL | Status: DC

## 2013-08-28 MED ORDER — ERGOCALCIFEROL 1.25 MG (50000 UT) PO CAPS: 50000.0000 [IU] | ORAL_CAPSULE | ORAL | Status: DC

## 2013-08-28 NOTE — Addendum Note (Signed)
Addended by: Sherlene Shams on: 08/28/2013 01:33 PM   Modules accepted: Orders

## 2013-08-28 NOTE — Assessment & Plan Note (Signed)
TWSH remains suppressed.  Lowering dose to 125 mcg daily

## 2013-09-06 ENCOUNTER — Encounter: Payer: Self-pay | Admitting: Internal Medicine

## 2013-09-10 ENCOUNTER — Other Ambulatory Visit (INDEPENDENT_AMBULATORY_CARE_PROVIDER_SITE_OTHER): Payer: Medicare Other

## 2013-09-10 DIAGNOSIS — Z1211 Encounter for screening for malignant neoplasm of colon: Secondary | ICD-10-CM

## 2013-09-10 LAB — FECAL OCCULT BLOOD, IMMUNOCHEMICAL: Fecal Occult Bld: NEGATIVE

## 2013-09-24 ENCOUNTER — Encounter: Payer: Self-pay | Admitting: Internal Medicine

## 2013-09-24 ENCOUNTER — Ambulatory Visit (INDEPENDENT_AMBULATORY_CARE_PROVIDER_SITE_OTHER): Payer: Medicare Other | Admitting: Internal Medicine

## 2013-09-24 VITALS — BP 122/66 | HR 66 | Temp 98.0°F | Resp 12 | Wt 143.5 lb

## 2013-09-24 DIAGNOSIS — E039 Hypothyroidism, unspecified: Secondary | ICD-10-CM

## 2013-09-24 DIAGNOSIS — R42 Dizziness and giddiness: Secondary | ICD-10-CM

## 2013-09-24 DIAGNOSIS — A084 Viral intestinal infection, unspecified: Secondary | ICD-10-CM | POA: Insufficient documentation

## 2013-09-24 MED ORDER — PREDNISONE (PAK) 10 MG PO TABS: ORAL_TABLET | ORAL | Status: DC

## 2013-09-24 MED ORDER — ONDANSETRON HCL 4 MG PO TABS: 4.0000 mg | ORAL_TABLET | Freq: Three times a day (TID) | ORAL | Status: DC | PRN

## 2013-09-24 MED ORDER — MECLIZINE HCL 25 MG PO TABS: 25.0000 mg | ORAL_TABLET | Freq: Three times a day (TID) | ORAL | Status: DC | PRN

## 2013-09-24 NOTE — Patient Instructions (Addendum)
We will recheck your thyroid level in 2 weeks,  To make sure it is not overactive again. I am prescribing a prednisone taper,  Meclizine for the dizziness,  And zofran for the nausea.     Vertigo Vertigo means you feel like you or your surroundings are moving when they are not. Vertigo can be dangerous if it occurs when you are at work, driving, or performing difficult activities.  CAUSES  Vertigo occurs when there is a conflict of signals sent to your brain from the visual and sensory systems in your body. There are many different causes of vertigo, including:  Infections, especially in the inner ear.  A bad reaction to a drug or misuse of alcohol and medicines.  Withdrawal from drugs or alcohol.  Rapidly changing positions, such as lying down or rolling over in bed.  A migraine headache.  Decreased blood flow to the brain.  Increased pressure in the brain from a head injury, infection, tumor, or bleeding. SYMPTOMS  You may feel as though the world is spinning around or you are falling to the ground. Because your balance is upset, vertigo can cause nausea and vomiting. You may have involuntary eye movements (nystagmus). DIAGNOSIS  Vertigo is usually diagnosed by physical exam. If the cause of your vertigo is unknown, your caregiver may perform imaging tests, such as an MRI scan (magnetic resonance imaging). TREATMENT  Most cases of vertigo resolve on their own, without treatment. Depending on the cause, your caregiver may prescribe certain medicines. If your vertigo is related to body position issues, your caregiver may recommend movements or procedures to correct the problem. In rare cases, if your vertigo is caused by certain inner ear problems, you may need surgery. HOME CARE INSTRUCTIONS   Follow your caregiver's instructions.  Avoid driving.  Avoid operating heavy machinery.  Avoid performing any tasks that would be dangerous to you or others during a vertigo  episode.  Tell your caregiver if you notice that certain medicines seem to be causing your vertigo. Some of the medicines used to treat vertigo episodes can actually make them worse in some people. SEEK IMMEDIATE MEDICAL CARE IF:   Your medicines do not relieve your vertigo or are making it worse.  You develop problems with talking, walking, weakness, or using your arms, hands, or legs.  You develop severe headaches.  Your nausea or vomiting continues or gets worse.  You develop visual changes.  A family member notices behavioral changes.  Your condition gets worse. MAKE SURE YOU:  Understand these instructions.  Will watch your condition.  Will get help right away if you are not doing well or get worse. Document Released: 06/16/2005 Document Revised: 11/29/2011 Document Reviewed: 03/25/2011 [facility] Patient Information 2014 [place].

## 2013-09-24 NOTE — Progress Notes (Signed)
Patient ID: Christine Manning, female   DOB: 1946/04/20, 68 y.o.   MRN: [id number]    Patient Active Problem List   Diagnosis Date Noted  . Viral intestinal infection 09/24/2013  . Unspecified vitamin D deficiency 08/28/2013  . Overweight (BMI 25.0-29.9) 08/14/2013  . Chest pain, unspecified 08/14/2013  . S/P hysterectomy with oophorectomy 08/13/2013  . History of thyroid nodule 08/13/2013  . Routine general medical examination at a health care facility 08/13/2013  . Osteoarthritis of left knee 07/16/2013  . Degenerative disk disease 07/16/2013  . Chronic suprapubic pain 07/07/2013  . Back pain with radiation 07/05/2013  . Personal history of DVT (deep vein thrombosis)   . Anxiety 10/11/2012  . Irritable bowel syndrome 10/11/2012  . 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 Hyperlipidemia   . Hypertension   . Asthma   . Unspecified hypothyroidism     Subjective:  CC:   Chief Complaint  Patient presents with  . Dizziness    ? vertigo, nausea, ear pain    HPI:   Christine Manning a 68 y.o. female who presents Feeling dizzy and off balance without actual symptoms of  Vertigo.  Denies alcohol use but reports feeling drunk. Has been nauseated and had several episodes of vomiting since  Friday night, 3 days ago.    Has had vertigo in the past .  Ears hurt,   Had a pain behind the right ear around Christmas holidays  Which resolved.  Having mild myalgias and sinus drainage.No recorded fevers. Several sick contacts.   Past Medical History  Diagnosis Date  . Hyperlipidemia   . Hypertension   . Asthma   . Thyroid disease   . Personal history of DVT (deep vein thrombosis) 1980    x 2, pregnanacy and immobility induced     Past Surgical History  Procedure Laterality Date  . Bladder suspension  2003  . Abdominal hysterectomy  1991    secondary to abnormal PAPs  . Coronary angioplasty  2013    arteries were clean .  Dr. Rona Ravens at [facility]        The following portions of the  patient's history were reviewed and updated as appropriate: Allergies, current medications, and problem list.    Review of Systems:   12 Pt  review of systems was negative except those addressed in the HPI,     History   Social History  . Marital Status: Divorced    Spouse Name: N/A    Number of Children: N/A  . Years of Education: N/A   Occupational History  . Not on file.   Social History Main Topics  . Smoking status: Never Smoker   . Smokeless tobacco: Never Used  . Alcohol Use: No  . Drug Use: No  . Sexual Activity: Not on file   Other Topics Concern  . 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     Objective:  Filed Vitals:   09/24/13 1438  BP: 122/66  Pulse: 66  Temp: 98 F (36.7 C)  Resp: 12     General appearance: alert, cooperative and appears stated age Ears: normal TM's and external ear canals both ears Throat: lips, mucosa, and tongue normal; teeth and gums normal Neck: no adenopathy, no carotid bruit, supple, symmetrical, trachea midline and thyroid not enlarged, symmetric, no tenderness/mass/nodules Back: symmetric, no curvature. ROM normal. No CVA tenderness. Lungs: clear to auscultation bilaterally Heart: regular rate and rhythm, S1, S2  normal, no murmur, click, rub or gallop Abdomen: soft, non-tender; bowel sounds normal; no masses,  no organomegaly Pulses: 2+ and symmetric Skin: Skin color, texture, turgor normal. No rashes or lesions Lymph nodes: Cervical, supraclavicular, and axillary nodes normal.  Assessment and Plan:  Viral intestinal infection With dizziness suggestive of mild vertigo . Influenza rapid test was negative.  She is not orthostatic by history.  Advised to continue clear fluids until vomiting and diarrhea resolve.  Add prednisone taper, zofran for nausea and mecllizine for dizziness. NSAIDs suspended while on prednisone   Unspecified hypothyroidism Return in 2 weeks for  repeat TSH.  Last one was suppressed and dose was decreased.    Updated Medication List Outpatient Encounter Prescriptions as of 09/24/2013  Medication Sig  . albuterol (PROVENTIL) (2.5 MG/3ML) 0.083% nebulizer solution Take 3 mLs (2.5 mg total) by nebulization every 6 (six) hours as needed for wheezing or shortness of breath.  . Albuterol Sulfate (VENTOLIN HFA IN) Inhale 90 mcg into the lungs. 2 puffs every four hours as needed.  Marland Kitchen aspirin 81 MG tablet Take 81 mg by mouth daily.  Marland Kitchen dicyclomine (BENTYL) 10 MG capsule Take 1 capsule (10 mg total) by mouth 4 (four) times daily -  before meals and at bedtime.  . ergocalciferol (DRISDOL) 50000 UNITS capsule Take 1 capsule (50,000 Units total) by mouth once a week.  . Fluticasone-Salmeterol (ADVAIR) 250-50 MCG/DOSE AEPB Inhale 1 puff into the lungs every 12 (twelve) hours.  . hydrochlorothiazide (HYDRODIURIL) 25 MG tablet Take 1 tablet (25 mg total) by mouth daily.  . hydrOXYzine (ATARAX/VISTARIL) 25 MG tablet Take 25 mg by mouth 3 (three) times daily as needed.  . hyoscyamine (LEVSIN SL) 0.125 MG SL tablet Place 1 tablet (0.125 mg total) under the tongue every 4 (four) hours as needed for cramping.  . Lactobacillus-Inulin ([place]) CAPS Take 1 capsule by mouth daily.  Marland Kitchen levothyroxine (SYNTHROID, LEVOTHROID) 125 MCG tablet Take 1 tablet (125 mcg total) by mouth daily.  Marland Kitchen lisinopril (PRINIVIL,ZESTRIL) 20 MG tablet Take 1 tablet (20 mg total) by mouth daily.  . metoprolol tartrate (LOPRESSOR) 25 MG tablet Take 1 tablet (25 mg total) by mouth 2 (two) times daily.  . Multiple Vitamin (MULTIVITAMIN) tablet Take 1 tablet by mouth daily.  . simvastatin (ZOCOR) 40 MG tablet Take 40 mg by mouth every evening.  . meclizine (ANTIVERT) 25 MG tablet Take 1 tablet (25 mg total) by mouth 3 (three) times daily as needed for dizziness.  . meloxicam (MOBIC) 15 MG tablet Take 1 tablet (15 mg total) by mouth daily.  . mometasone (NASONEX) 50 MCG/ACT  nasal spray Place 2 sprays into the nose daily.  . ondansetron (ZOFRAN) 4 MG tablet Take 1 tablet (4 mg total) by mouth every 8 (eight) hours as needed for nausea or vomiting.  . pantoprazole (PROTONIX) 40 MG tablet Take 40 mg by mouth daily.  . predniSONE (STERAPRED UNI-PAK) 10 MG tablet 6 tablets on Day 1 , then reduce by 1 tablet daily until gone  . Tdap (BOOSTRIX) 5-2.5-18.5 LF-MCG/0.5 injection Inject 0.5 mLs into the muscle once.  . zoster vaccine live, PF, (ZOSTAVAX) 54098 UNT/0.65ML injection Inject 19,400 Units into the skin once.     No orders of the defined types were placed in this encounter.    No Follow-up on file.

## 2013-09-24 NOTE — Progress Notes (Signed)
Pre-visit discussion using our clinic review tool. No additional management support is needed unless otherwise documented below in the visit note.  

## 2013-09-25 ENCOUNTER — Encounter: Payer: Self-pay | Admitting: Internal Medicine

## 2013-09-25 NOTE — Assessment & Plan Note (Addendum)
With dizziness suggestive of mild vertigo . Influenza rapid test was negative.  She is not orthostatic by history.  Advised to continue clear fluids until vomiting and diarrhea resolve.  Add prednisone taper, zofran for nausea and mecllizine for dizziness. NSAIDs suspended while on prednisone

## 2013-09-25 NOTE — Assessment & Plan Note (Signed)
Return in 2 weeks for repeat TSH.  Last one was suppressed and dose was decreased.

## 2013-10-10 ENCOUNTER — Encounter: Payer: Self-pay | Admitting: Internal Medicine

## 2013-10-10 ENCOUNTER — Ambulatory Visit (INDEPENDENT_AMBULATORY_CARE_PROVIDER_SITE_OTHER): Payer: Medicare Other | Admitting: Internal Medicine

## 2013-10-10 ENCOUNTER — Other Ambulatory Visit: Payer: Self-pay | Admitting: Internal Medicine

## 2013-10-10 VITALS — BP 114/76 | HR 64 | Temp 98.3°F | Resp 12 | Wt 148.8 lb

## 2013-10-10 DIAGNOSIS — E039 Hypothyroidism, unspecified: Secondary | ICD-10-CM

## 2013-10-10 DIAGNOSIS — Z862 Personal history of diseases of the blood and blood-forming organs and certain disorders involving the immune mechanism: Secondary | ICD-10-CM

## 2013-10-10 DIAGNOSIS — Z8639 Personal history of other endocrine, nutritional and metabolic disease: Secondary | ICD-10-CM

## 2013-10-10 LAB — TSH: TSH: 9.5 u[IU]/mL — AB (ref 0.35–5.50)

## 2013-10-10 LAB — T4, FREE: Free T4: 0.76 ng/dL (ref 0.60–1.60)

## 2013-10-10 NOTE — Progress Notes (Signed)
Patient ID: Christine Manning, female   DOB: 18-Jan-1946, 68 y.o.   MRN: [id number]  HPI  Christine Manning is a 68 y.o.-year-old female, referred by her PCP, Dr. Derrel Nip, for evaluation for a right thyroid nodule and hypothyroidism.  She was told she had a thyroid nodule in 1998 >> started to see Dr Francoise Schaumann. She was having FNA every 6 months for many years. She has RAI tx "to shrink the nodule" 3-4 times. She stopped going to see him ~ 10 years ago.  Thyroid U/S [facility]) 08/20/2013: atrophic left thyroid lobe, large R thyroid nodule: 4.1x2.1x3.2 cm, with calcifications and minimal blood flow within the lesion. Normal LNs with fatty hila on R side of neck.  Pt has feeling nodules in neck, occasional hoarseness, occasional dysphagia/no odynophagia, no SOB with lying down.  I reviewed pt's thyroid tests: Lab Results  Component Value Date   TSH 0.17* 08/27/2013   TSH 0.03* 07/05/2013   FREET4 1.07 08/27/2013    06/2013: Levothyroxine decreased from 150 >> 137 mcg 07/2013: Levothyroxine decreased from 137 >> 125 mcg  She takes the thyroid hh: - am, fasting - with water or juice! - has b'fast >30 min later - takes MVI and calcium in the evening - was taking Protonix up to 8 mo ago later in the day  Pt c/o: - + heat intolerance - + tremors - + palpitations - no anxiety/depression - + hyperdefecation - + weight loss and gain - + dry skin - + hair thinning - + fatigue - + joint pain   Pt does have a FH of thyroid ds in mother. No FH of thyroid cancer. No h/o radiation tx to head or neck other than RAI tx (for nodule? - see above).  I reviewed her chart and she also has a history of hysterectomy at 68 y/o. She was admitted 4 x for CP (ruled out for MI). She had a heart cath at [facility] 2 years ago >> normal.   ROS: Constitutional: + weight gain, + fatigue, + hot flushes, + poor sleep, + nocturia Eyes: + blurry vision, no xerophthalmia ENT: no sore throat, no nodules palpated in throat, no  dysphagia/odynophagia, + hoarseness, + tinnitus, + hypoacusis Cardiovascular: + CP/+ SOB/+ palpitations/+ leg swelling Respiratory: + cough/+ SOB/+ wheezing Gastrointestinal: + N/+ V/+ D/no C, + heartburn Musculoskeletal: + muscle aches/+ joint aches Skin: no rashes, + itching, + easy bruising, + hair loss Neurological: no tremors/numbness/tingling/dizziness, + HA Psychiatric: no depression/anxiety  Past Medical History  Diagnosis Date  . Hyperlipidemia   . Hypertension   . Asthma   . Thyroid disease   . Personal history of DVT (deep vein thrombosis) 1980    x 2, pregnanacy and immobility induced    Past Surgical History  Procedure Laterality Date  . Bladder suspension  2003  . Abdominal hysterectomy  1991    secondary to abnormal PAPs  . Coronary angioplasty  2013    arteries were clean .  Dr. Rona Ravens at [facility] History  . Marital Status: Divorced    Spouse Name: N/A    Number of Children: 3   Occupational History  . retired   Social History Main Topics  . Smoking status: Never Smoker   . Smokeless tobacco: Never Used  . Alcohol Use: No  . 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    Current Outpatient Prescriptions on File Prior  to Visit  Medication Sig Dispense Refill  . albuterol (PROVENTIL) (2.5 MG/3ML) 0.083% nebulizer solution Take 3 mLs (2.5 mg total) by nebulization every 6 (six) hours as needed for wheezing or shortness of breath.  150 mL  1  . Albuterol Sulfate (VENTOLIN HFA IN) Inhale 90 mcg into the lungs. 2 puffs every four hours as needed.      Marland Kitchen aspirin 81 MG tablet Take 81 mg by mouth daily.      Marland Kitchen dicyclomine (BENTYL) 10 MG capsule Take 1 capsule (10 mg total) by mouth 4 (four) times daily -  before meals and at bedtime.  90 capsule  2  . ergocalciferol (DRISDOL) 50000 UNITS capsule Take 1 capsule (50,000 Units total) by mouth once a week.  12 capsule  0  . Fluticasone-Salmeterol  (ADVAIR) 250-50 MCG/DOSE AEPB Inhale 1 puff into the lungs every 12 (twelve) hours.  60 each  6  . hydrochlorothiazide (HYDRODIURIL) 25 MG tablet Take 1 tablet (25 mg total) by mouth daily.  30 tablet  5  . hydrOXYzine (ATARAX/VISTARIL) 25 MG tablet Take 25 mg by mouth 3 (three) times daily as needed.      . hyoscyamine (LEVSIN SL) 0.125 MG SL tablet Place 1 tablet (0.125 mg total) under the tongue every 4 (four) hours as needed for cramping.  30 tablet  3  . Lactobacillus-Inulin ([place]) CAPS Take 1 capsule by mouth daily.  14 capsule  0  . levothyroxine (SYNTHROID, LEVOTHROID) 125 MCG tablet Take 1 tablet (125 mcg total) by mouth daily.  90 tablet  2  . lisinopril (PRINIVIL,ZESTRIL) 20 MG tablet Take 1 tablet (20 mg total) by mouth daily.  30 tablet  5  . meclizine (ANTIVERT) 25 MG tablet Take 1 tablet (25 mg total) by mouth 3 (three) times daily as needed for dizziness.  30 tablet  0  . meloxicam (MOBIC) 15 MG tablet Take 1 tablet (15 mg total) by mouth daily.  30 tablet  2  . metoprolol tartrate (LOPRESSOR) 25 MG tablet Take 1 tablet (25 mg total) by mouth 2 (two) times daily.  60 tablet  5  . mometasone (NASONEX) 50 MCG/ACT nasal spray Place 2 sprays into the nose daily.      . Multiple Vitamin (MULTIVITAMIN) tablet Take 1 tablet by mouth daily.      . ondansetron (ZOFRAN) 4 MG tablet Take 1 tablet (4 mg total) by mouth every 8 (eight) hours as needed for nausea or vomiting.  30 tablet  1  . pantoprazole (PROTONIX) 40 MG tablet Take 40 mg by mouth daily.      . predniSONE (STERAPRED UNI-PAK) 10 MG tablet 6 tablets on Day 1 , then reduce by 1 tablet daily until gone  21 tablet  0  . simvastatin (ZOCOR) 40 MG tablet Take 40 mg by mouth every evening.      . Tdap (BOOSTRIX) 5-2.5-18.5 LF-MCG/0.5 injection Inject 0.5 mLs into the muscle once.  0.5 mL  0  . zoster vaccine live, PF, (ZOSTAVAX) 16109 UNT/0.65ML injection Inject 19,400 Units into the skin once.  1 each  0   No  current facility-administered medications on file prior to visit.   Allergies  Allergen Reactions  . Codeine Hives and Shortness Of Breath  . Latex Hives and Swelling  . Augmentin [Amoxicillin-Pot Clavulanate] Other (See Comments)    Causes IBS  . Morphine And Related Nausea And Vomiting   Family History  Problem Relation Age of Onset  .  Asthma Mother   . Mental illness Mother     multi infarct dementia  . Stroke Mother   . Mental illness Father     alzheimers dementia  . Asthma Brother   . Asthma Son   . Cancer Maternal Aunt     colon ca  . Cancer Paternal Aunt     colon ca  . Cancer Maternal Grandmother     uterine ca   PE: BP 114/76  Pulse 64  Temp(Src) 98.3 F (36.8 C) (Oral)  Resp 12  Wt 148 lb 12.8 oz (67.495 kg)  SpO2 99% Wt Readings from Last 3 Encounters:  10/10/13 148 lb 12.8 oz (67.495 kg)  09/24/13 143 lb 8 oz (65.091 kg)  08/27/13 147 lb 4 oz (66.792 kg)   Constitutional: normal weight, in NAD Eyes: PERRLA, EOMI, no exophthalmos ENT: moist mucous membranes, + thyromegaly - large nodule R thyroid, moves with swallowing, no cervical lymphadenopathy Cardiovascular: RRR, No MRG Respiratory: CTA B Gastrointestinal: abdomen soft, NT, ND, BS+ Musculoskeletal: no deformities, strength intact in all 4;  Skin: moist, warm, no rashes Neurological: no tremor with outstretched hands, DTR normal in all 4  ASSESSMENT: 1. R thyroid nodule - thyroid U/S [facility]) 08/20/2013: atrophic left thyroid lobe, large R thyroid nodule: 4.1x2.1x3.2 cm, with calcifications and minimal blood flow within the lesion. Normal LNs with fatty hila on R side of neck. - thyroid nodule in 1998 >> started to see Dr Francoise Schaumann >> FNA every 6 months for many years >> had RAI tx "to shrink the nodule" 3-4 times. She stopped going to see him ~ 10 years ago.  2. Hypothyroidism - on generic Levothyroxine  PLAN: 1. Thyroid nodule  - I reviewed the report of her thyroid ultrasound along with the  patient. I pointed out that the nodule is large, this being a risk factor for cancer. I also has calcifications, some internal blood flow, which can also be RFs for cancer. Pt does not have a thyroid cancer family history but does have a h/o repeated RAI tx's (? Unclear reason).  - I believe the best way to go FWD is by repeating a thyroid biopsy (FNA). I d/w her that the results will likely be influenced by her repeated thyroid RAI tx's - I explained that this is not cancer, we can continue to follow her on a yearly basis, and check another ultrasound in another year or 2. - she should let me know if she develops neck compression symptoms, in that case, we might need to do either lobectomy or thyroidectomy - patient decided to have the FNA done now >> I ordered this.  - I'll see her back in a year, assuming her FNA is normal. If FNA abnormal, we will meet sooner.  - I advised pt to join my chart and I will send her the results through there   2. Hypothyroidism - see dose changes in HPI - repeat TSH and Free T4 now  Office Visit on 10/10/2013  Component Date Value Range Status  . TSH 10/10/2013 9.50* 0.35 - 5.50 uIU/mL Final  . Free T4 10/10/2013 0.76  0.60 - 1.60 ng/dL Final   FNA: Adequacy Reason Satisfactory But Limited For Evaluation, Scant Cellularity. Diagnosis THYROID, FINE NEEDLE ASPIRATION, RIGHT (SPECIMEN 1 OF 1, COLLECTED ON 2/10//15): FINDINGS CONSISTENT WITH NON-NEOPLASTIC GOITER. Mali RUND DO Pathologist, Electronic Signature (Case signed 10/31/2013) Specimen Clinical Information Right thyroid lobe is replaced by a heterogeneous nodule measuring up to 4.1 cm, Hypothyroidism, Evaluate  thyroid nodules

## 2013-10-10 NOTE — Patient Instructions (Signed)
Please stop at the lab. You will be called with the thyroid ultrasound schedule.

## 2013-10-12 MED ORDER — LEVOTHYROXINE SODIUM 137 MCG PO TABS: 137.0000 ug | ORAL_TABLET | Freq: Every day | ORAL | Status: DC

## 2013-10-30 ENCOUNTER — Other Ambulatory Visit (HOSPITAL_COMMUNITY)
Admission: RE | Admit: 2013-10-30 | Discharge: 2013-10-30 | Disposition: A | Discharge status: Home / Self Care | Payer: Medicare Other | Source: Ambulatory Visit | Attending: Diagnostic Radiology | Admitting: Diagnostic Radiology

## 2013-10-30 ENCOUNTER — Ambulatory Visit
Admission: RE | Admit: 2013-10-30 | Discharge: 2013-10-30 | Disposition: A | Discharge status: Home / Self Care | Payer: Medicare Other | Source: Ambulatory Visit | Attending: Internal Medicine | Admitting: Internal Medicine

## 2013-10-30 DIAGNOSIS — E049 Nontoxic goiter, unspecified: Secondary | ICD-10-CM | POA: Insufficient documentation

## 2013-11-12 LAB — HM DEXA SCAN

## 2013-11-14 ENCOUNTER — Ambulatory Visit: Payer: Self-pay | Admitting: Internal Medicine

## 2013-11-22 ENCOUNTER — Telehealth: Payer: Self-pay | Admitting: Internal Medicine

## 2013-11-22 DIAGNOSIS — M8580 Other specified disorders of bone density and structure, unspecified site: Secondary | ICD-10-CM

## 2013-11-22 MED ORDER — IBANDRONATE SODIUM 150 MG PO TABS: 150.0000 mg | ORAL_TABLET | ORAL | Status: DC

## 2013-11-22 NOTE — Telephone Encounter (Signed)
Patient was taking boniva , but was making patient nauseated and she said decision was to stop until after bone density please advise?

## 2013-11-22 NOTE — Telephone Encounter (Signed)
Bone Density scores received, she has osteopenia,  Moderate.  Would repeat in 2 years .  Is she taking boniva or alendronate? Report says she is but not on med list    Continue calcium, vitamin d and weight bearing exercise on a regular basis.

## 2013-11-22 NOTE — Telephone Encounter (Signed)
rx sent for one month initially to make sure she tolerates it

## 2013-11-22 NOTE — Telephone Encounter (Signed)
Patient needs script.

## 2013-11-22 NOTE — Telephone Encounter (Signed)
I would advise her to resume it based on her DEXA results.  If the nauseau recurs, we will try to get Prolia approved (the shot twice a year) .,  If she needs an rx for the boniva. Let me know

## 2013-11-23 NOTE — Telephone Encounter (Signed)
Left message for patient to return call.

## 2013-11-23 NOTE — Telephone Encounter (Signed)
Patient notified

## 2013-12-12 ENCOUNTER — Encounter: Payer: Self-pay | Admitting: Internal Medicine

## 2014-02-19 ENCOUNTER — Telehealth: Payer: Self-pay | Admitting: *Deleted

## 2014-02-19 ENCOUNTER — Telehealth: Payer: Self-pay | Admitting: Internal Medicine

## 2014-02-19 MED ORDER — LISINOPRIL 20 MG PO TABS: 20.0000 mg | ORAL_TABLET | Freq: Every day | ORAL | Status: DC

## 2014-02-19 MED ORDER — ALBUTEROL SULFATE HFA 108 (90 BASE) MCG/ACT IN AERS: 2.0000 | INHALATION_SPRAY | Freq: Four times a day (QID) | RESPIRATORY_TRACT | Status: DC | PRN

## 2014-02-19 MED ORDER — METOPROLOL TARTRATE 25 MG PO TABS: 25.0000 mg | ORAL_TABLET | Freq: Two times a day (BID) | ORAL | Status: DC

## 2014-02-19 MED ORDER — ALBUTEROL SULFATE HFA 108 (90 BASE) MCG/ACT IN AERS: 2.0000 | INHALATION_SPRAY | RESPIRATORY_TRACT | Status: DC | PRN

## 2014-02-19 MED ORDER — HYDROCHLOROTHIAZIDE 25 MG PO TABS: 25.0000 mg | ORAL_TABLET | Freq: Every day | ORAL | Status: DC

## 2014-02-19 NOTE — Telephone Encounter (Signed)
Spoke with pt, notified all Rxs sent to pharmacy except levothyroxine. Last TSH 1/15 was elevated, Dr. Cruzita Lederer had recommended 6 week recheck which pt has not had done. Lab appt scheduled for tomorrow, orders already entered. Pt states she has enough for 1 week so will refill once results are back.

## 2014-02-19 NOTE — Telephone Encounter (Addendum)
Pt needs refill on rx Albuterol, levothyroxine, lisinopril, metoprolol, and hydrocholorthiazide. Patient has another week before she is out of all meds. Patient stated that she needs a 3 month supply. Please call pt when rx is ready/msn

## 2014-02-19 NOTE — Telephone Encounter (Signed)
Please advise 

## 2014-02-19 NOTE — Telephone Encounter (Signed)
Pharmacy Note:  Insurance does not cover Ventolin HFA

## 2014-02-19 NOTE — Telephone Encounter (Signed)
We will try Proair [facility]

## 2014-02-20 ENCOUNTER — Other Ambulatory Visit (INDEPENDENT_AMBULATORY_CARE_PROVIDER_SITE_OTHER): Payer: Medicare Other

## 2014-02-20 DIAGNOSIS — E039 Hypothyroidism, unspecified: Secondary | ICD-10-CM

## 2014-02-21 LAB — TSH+FREE T4
Free T4: 1.84 ng/dL — ABNORMAL HIGH (ref 0.82–1.77)
TSH: 0.193 u[IU]/mL — AB (ref 0.450–4.500)

## 2014-02-22 MED ORDER — LEVOTHYROXINE SODIUM 125 MCG PO TABS: 125.0000 ug | ORAL_TABLET | Freq: Every day | ORAL | Status: DC

## 2014-02-22 NOTE — Addendum Note (Signed)
Addended by: Crecencio Mc on: 02/22/2014 09:59 AM   Modules accepted: Orders

## 2014-02-22 NOTE — Assessment & Plan Note (Signed)
Thyroid function is overactive on current dose.  I have sent a lower dose of levothyroxine to her pharmacy and would like patient to change immediatley.  

## 2014-04-15 ENCOUNTER — Encounter: Payer: Self-pay | Admitting: Internal Medicine

## 2014-04-15 ENCOUNTER — Ambulatory Visit (INDEPENDENT_AMBULATORY_CARE_PROVIDER_SITE_OTHER): Payer: Medicare Other | Admitting: Internal Medicine

## 2014-04-15 VITALS — BP 118/68 | HR 57 | Temp 98.7°F | Resp 14 | Ht 59.0 in | Wt 139.2 lb

## 2014-04-15 DIAGNOSIS — E785 Hyperlipidemia, unspecified: Secondary | ICD-10-CM

## 2014-04-15 DIAGNOSIS — E663 Overweight: Secondary | ICD-10-CM

## 2014-04-15 DIAGNOSIS — E039 Hypothyroidism, unspecified: Secondary | ICD-10-CM

## 2014-04-15 DIAGNOSIS — M5032 Other cervical disc degeneration, mid-cervical region, unspecified level: Secondary | ICD-10-CM

## 2014-04-15 DIAGNOSIS — M542 Cervicalgia: Secondary | ICD-10-CM

## 2014-04-15 DIAGNOSIS — I10 Essential (primary) hypertension: Secondary | ICD-10-CM

## 2014-04-15 DIAGNOSIS — M5030 Other cervical disc degeneration, unspecified cervical region: Secondary | ICD-10-CM

## 2014-04-15 MED ORDER — MELOXICAM 15 MG PO TABS: 15.0000 mg | ORAL_TABLET | Freq: Every day | ORAL | Status: DC

## 2014-04-15 MED ORDER — GABAPENTIN 100 MG PO CAPS: 100.0000 mg | ORAL_CAPSULE | Freq: Three times a day (TID) | ORAL | Status: DC

## 2014-04-15 NOTE — Progress Notes (Signed)
Patient ID: Christine Manning, female   DOB: 09/23/1945, 68 y.o.   MRN: [id number]  Patient Active Problem List   Diagnosis Date Noted  . Osteopenia 11/22/2013  . Unspecified vitamin D deficiency 08/28/2013  . Overweight (BMI 25.0-29.9) 08/14/2013  . Chest pain, unspecified 08/14/2013  . S/P hysterectomy with oophorectomy 08/13/2013  . History of thyroid nodule 08/13/2013  . Routine general medical examination at a health care facility 08/13/2013  . Osteoarthritis of left knee 07/16/2013  . Degenerative disk disease 07/16/2013  . Chronic suprapubic pain 07/07/2013  . Back pain with radiation 07/05/2013  . Personal history of DVT (deep vein thrombosis)   . Anxiety 10/11/2012  . Irritable bowel syndrome 10/11/2012  . 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 Hyperlipidemia   . Hypertension   . Asthma   . Unspecified hypothyroidism     Subjective:  CC:   Chief Complaint  Patient presents with  . Follow-up    medication refills and right arm pain in shoulder radiates to finger tips.    HPI:   Christine Manning is a 68 y.o. female who presents for Follow up on hypertension, hypothyroidism, hyperlipidemia and obesity.  She has lost 10 lbs eating by modifying her diet to include more servings of vegetables and adding daily walking  ,  Planning to join water aerobics classes as well. Tolerating her medications without side effects,  And is due for repeat thyroid testing.   New complaint:  She reports progressive right arm pain starts in the posterior  neck and radiates to right elbow .  Started 4 months  ago and isgetting worse.  It is aggravated by driving,  And she has developed weakness in the 4th and 5th fingers hallmarked by  Trembling an by new onset difficulty holding a fork or spoon.   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  During  this line of work she had to frequently open jars.  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      Past Medical History  Diagnosis Date  . Hyperlipidemia   . Hypertension   . Asthma   . Thyroid disease   . Personal history of DVT (deep vein thrombosis) 1980    x 2, pregnanacy and immobility induced     Past Surgical History  Procedure Laterality Date  . Bladder suspension  2003  . Abdominal hysterectomy  1991    secondary to abnormal PAPs  . Coronary angioplasty  2013    arteries were clean .  Dr[facility] Rona Ravens at [facility]        The following portions of the patient's history were reviewed and updated as appropriate: Allergies, current medications, and problem list.    Review of Systems:   Patient denies headache, fevers, malaise, unintentional weight loss, skin rash, eye pain, sinus congestion and sinus pain, sore throat, dysphagia,  hemoptysis , cough, dyspnea, wheezing, chest pain, palpitations, orthopnea, edema, abdominal pain, nausea, melena, diarrhea, constipation, flank pain, dysuria, hematuria, urinary  Frequency, nocturia, numbness, tingling, seizures,  Focal weakness, Loss of consciousness,  Tremor, insomnia, depression, anxiety, and suicidal ideation.     History   Social History  . Marital Status: Divorced    Spouse Name: N/A    Number of Children: N/A  . Years of Education: N/A  Occupational History  . Not on file.   Social History Main Topics  . Smoking status: Never Smoker   . Smokeless tobacco: Never Used  . Alcohol Use: No  . Drug Use: No  . Sexual Activity: Not on file   Other Topics Concern  . 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     Objective:  Filed Vitals:   04/15/14 1339  BP: 118/68  Pulse: 57  Temp: 98.7 F (37.1 C)  Resp: 14     General appearance: alert, cooperative and appears stated age Ears: normal TM's and external ear  canals both ears Throat: lips, mucosa, and tongue normal; teeth and gums normal Neck: no adenopathy, no carotid bruit, supple, symmetrical, trachea midline and thyroid not enlarged, symmetric, no tenderness/mass/nodules Back: symmetric, no curvature. ROM normal. No CVA tenderness. Lungs: clear to auscultation bilaterally Heart: regular rate and rhythm, S1, S2 normal, no murmur, click, rub or gallop Abdomen: soft, non-tender; bowel sounds normal; no masses,  no organomegaly Pulses: 2+ and symmetric Skin: Skin color, texture, turgor normal. No rashes or lesions Lymph nodes: Cervical, supraclavicular, and axillary nodes normal. Neuro: Neuro: CNs 2-12 intact. DTRs 2+/4 in biceps, brachioradialis, patellars and achilles. Muscle strength 5/5 in upper and lower exremities. Fine resting tremor bilaterally both hands cerebellar function normal. Romberg negative.  No pronator drift.   Gait normal.  Negative Phalen's test  Assessment and Plan:  Degenerative disk disease Suspected in cervical spine given negative Phalen's test today and history of neck pain with radiculopathy to right arm.  Plain films ordered. NSAIDs and tylenol for pain managment  Overweight (BMI 25.0-29.9) I have congratulated her in reduction of   BMI and encouraged  Continued weight loss with goal of 10% of body weigh over the next 6 months using a low glycemic index diet and regular exercise a minimum of 5 days per week.    Unspecified hypothyroidism Thyroid function is overactive on current dose.  I have sent a lower dose of levothyroxine to her pharmacy and would like patient to change immediatley.   Lab Results  Component Value Date   TSH 0.008* 04/15/2014     Hypertension Well controlled on current regimen. Renal function has been stable, no changes today.  Lab Results  Component Value Date   CREATININE 1.1 08/27/2013    Lab Results  Component Value Date   NA 140 08/27/2013   K 3.7 08/27/2013   CL 103 08/27/2013    CO2 29 08/27/2013     Hyperlipidemia Asymptomatic on current statin therapy.   Return for fastin glabs in 6 weeks    Updated Medication List Outpatient Encounter Prescriptions as of 04/15/2014  Medication Sig  . albuterol (PROVENTIL HFA;VENTOLIN HFA) 108 (90 BASE) MCG/ACT inhaler Inhale 2 puffs into the lungs every 6 (six) hours as needed for wheezing or shortness of breath.  Marland Kitchen albuterol (PROVENTIL) (2.5 MG/3ML) 0.083% nebulizer solution Take 3 mLs (2.5 mg total) by nebulization every 6 (six) hours as needed for wheezing or shortness of breath.  Marland Kitchen albuterol (VENTOLIN HFA) 108 (90 BASE) MCG/ACT inhaler Inhale 2 puffs into the lungs every 4 (four) hours as needed.  Marland Kitchen aspirin 81 MG tablet Take 81 mg by mouth daily.  . ergocalciferol (DRISDOL) 50000 UNITS capsule Take 1 capsule (50,000 Units total) by mouth once a week.  . Fluticasone-Salmeterol (ADVAIR) 250-50 MCG/DOSE AEPB Inhale 1 puff into the lungs every 12 (twelve) hours.  Marland Kitchen  hydrochlorothiazide (HYDRODIURIL) 25 MG tablet Take 1 tablet (25 mg total) by mouth daily.  . Lactobacillus-Inulin ([place]) CAPS Take 1 capsule by mouth daily.  Marland Kitchen lisinopril (PRINIVIL,ZESTRIL) 20 MG tablet Take 1 tablet (20 mg total) by mouth daily.  . metoprolol tartrate (LOPRESSOR) 25 MG tablet Take 1 tablet (25 mg total) by mouth 2 (two) times daily.  . mometasone (NASONEX) 50 MCG/ACT nasal spray Place 2 sprays into the nose daily.  . Multiple Vitamin (MULTIVITAMIN) tablet Take 1 tablet by mouth daily.  . [DISCONTINUED] levothyroxine (SYNTHROID, LEVOTHROID) 125 MCG tablet Take 1 tablet (125 mcg total) by mouth daily before breakfast.  . dicyclomine (BENTYL) 10 MG capsule Take 1 capsule (10 mg total) by mouth 4 (four) times daily -  before meals and at bedtime.  . gabapentin (NEURONTIN) 100 MG capsule Take 1 capsule (100 mg total) by mouth 3 (three) times daily.  . hydrOXYzine (ATARAX/VISTARIL) 25 MG tablet Take 25 mg by mouth 3 (three) times  daily as needed.  . hyoscyamine (LEVSIN SL) 0.125 MG SL tablet Place 1 tablet (0.125 mg total) under the tongue every 4 (four) hours as needed for cramping.  . ibandronate (BONIVA) 150 MG tablet Take 1 tablet (150 mg total) by mouth every 30 (thirty) days. Take in the morning with a full glass of water, on an empty stomach, and do not take anything else by mouth or lie down for the next 30 min.  Marland Kitchen levothyroxine (SYNTHROID, LEVOTHROID) 112 MCG tablet Take 1 tablet (112 mcg total) by mouth daily.  . meclizine (ANTIVERT) 25 MG tablet Take 1 tablet (25 mg total) by mouth 3 (three) times daily as needed for dizziness.  . meloxicam (MOBIC) 15 MG tablet Take 1 tablet (15 mg total) by mouth daily.  . meloxicam (MOBIC) 15 MG tablet Take 1 tablet (15 mg total) by mouth daily.  . ondansetron (ZOFRAN) 4 MG tablet Take 1 tablet (4 mg total) by mouth every 8 (eight) hours as needed for nausea or vomiting.  . pantoprazole (PROTONIX) 40 MG tablet Take 40 mg by mouth daily.  . predniSONE (STERAPRED UNI-PAK) 10 MG tablet 6 tablets on Day 1 , then reduce by 1 tablet daily until gone  . simvastatin (ZOCOR) 40 MG tablet Take 40 mg by mouth every evening.  . Tdap (BOOSTRIX) 5-2.5-18.5 LF-MCG/0.5 injection Inject 0.5 mLs into the muscle once.  . zoster vaccine live, PF, (ZOSTAVAX) 65537 UNT/0.65ML injection Inject 19,400 Units into the skin once.     Orders Placed This Encounter  Procedures  . DG Cervical Spine Complete  . T4 AND TSH  . Lipid panel  . Comprehensive metabolic panel  . TSH    Return in about 4 weeks (around 05/13/2014).

## 2014-04-15 NOTE — Progress Notes (Signed)
Pre-visit discussion using our clinic review tool. No additional management support is needed unless otherwise documented below in the visit note.  

## 2014-04-15 NOTE — Patient Instructions (Addendum)
Your blood pressure is Well controlled on current regimen. Renal function stabl no changes today.  Repeating thyroid study today   resume meloxicam and tylenol for daytime pain  Adding gabapentin at night  Plain films of cervical spine at [facility]  Follow up one month

## 2014-04-16 LAB — T4 AND TSH
T4, Total: 12.8 ug/dL — ABNORMAL HIGH (ref 4.5–12.0)
TSH: 0.008 u[IU]/mL — ABNORMAL LOW (ref 0.450–4.500)

## 2014-04-16 MED ORDER — LEVOTHYROXINE SODIUM 112 MCG PO TABS: 112.0000 ug | ORAL_TABLET | Freq: Every day | ORAL | Status: DC

## 2014-04-16 NOTE — Assessment & Plan Note (Signed)
Suspected in cervical spine given negative Phalen's test today and history of neck pain with radiculopathy to right arm.  Plain films ordered. NSAIDs and tylenol for pain managment

## 2014-04-16 NOTE — Assessment & Plan Note (Signed)
Well controlled on current regimen. Renal function has been stable, no changes today.  Lab Results  Component Value Date   CREATININE 1.1 08/27/2013    Lab Results  Component Value Date   NA 140 08/27/2013   K 3.7 08/27/2013   CL 103 08/27/2013   CO2 29 08/27/2013

## 2014-04-16 NOTE — Assessment & Plan Note (Signed)
Thyroid function is overactive on current dose.  I have sent a lower dose of levothyroxine to her pharmacy and would like patient to change immediatley.   Lab Results  Component Value Date   TSH 0.008* 04/15/2014

## 2014-04-16 NOTE — Assessment & Plan Note (Signed)
I have congratulated her in reduction of   BMI and encouraged  Continued weight loss with goal of 10% of body weigh over the next 6 months using a low glycemic index diet and regular exercise a minimum of 5 days per week.    

## 2014-04-16 NOTE — Assessment & Plan Note (Signed)
Asymptomatic on current statin therapy.   Return for fastin glabs in 6 weeks

## 2014-04-17 ENCOUNTER — Ambulatory Visit (INDEPENDENT_AMBULATORY_CARE_PROVIDER_SITE_OTHER)
Admission: RE | Admit: 2014-04-17 | Discharge: 2014-04-17 | Disposition: A | Discharge status: Home / Self Care | Payer: Medicare Other | Source: Ambulatory Visit | Attending: Internal Medicine | Admitting: Internal Medicine

## 2014-04-17 DIAGNOSIS — M542 Cervicalgia: Secondary | ICD-10-CM

## 2014-05-13 ENCOUNTER — Ambulatory Visit (INDEPENDENT_AMBULATORY_CARE_PROVIDER_SITE_OTHER): Payer: Medicare Other | Admitting: Internal Medicine

## 2014-05-13 ENCOUNTER — Encounter: Payer: Self-pay | Admitting: Internal Medicine

## 2014-05-13 VITALS — BP 120/70 | HR 78 | Temp 98.9°F | Resp 16 | Ht 59.0 in | Wt 136.0 lb

## 2014-05-13 DIAGNOSIS — E663 Overweight: Secondary | ICD-10-CM

## 2014-05-13 DIAGNOSIS — G8929 Other chronic pain: Secondary | ICD-10-CM

## 2014-05-13 DIAGNOSIS — M5030 Other cervical disc degeneration, unspecified cervical region: Secondary | ICD-10-CM

## 2014-05-13 DIAGNOSIS — M25519 Pain in unspecified shoulder: Secondary | ICD-10-CM

## 2014-05-13 DIAGNOSIS — M25511 Pain in right shoulder: Secondary | ICD-10-CM

## 2014-05-13 DIAGNOSIS — E039 Hypothyroidism, unspecified: Secondary | ICD-10-CM

## 2014-05-13 DIAGNOSIS — Z1211 Encounter for screening for malignant neoplasm of colon: Secondary | ICD-10-CM

## 2014-05-13 MED ORDER — SIMVASTATIN 40 MG PO TABS: 40.0000 mg | ORAL_TABLET | Freq: Every evening | ORAL | Status: DC

## 2014-05-13 NOTE — Progress Notes (Signed)
Patient ID: Christine Manning, female   DOB: August 21, 1946, 68 y.o.   MRN: [id number]   Patient Active Problem List   Diagnosis Date Noted  . Chronic pain in right shoulder 05/14/2014  . Osteopenia 11/22/2013  . Unspecified vitamin D deficiency 08/28/2013  . Overweight (BMI 25.0-29.9) 08/14/2013  . Chest pain, unspecified 08/14/2013  . S/P hysterectomy with oophorectomy 08/13/2013  . History of thyroid nodule 08/13/2013  . Routine general medical examination at a health care facility 08/13/2013  . Osteoarthritis of left knee 07/16/2013  . Degenerative disk disease 07/16/2013  . Chronic suprapubic pain 07/07/2013  . Back pain with radiation 07/05/2013  . Personal history of DVT (deep vein thrombosis)   . Anxiety 10/11/2012  . Irritable bowel syndrome 10/11/2012  . 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 Hyperlipidemia   . Hypertension   . Asthma   . Unspecified hypothyroidism     Subjective:  CC:   Chief Complaint  Patient presents with  . Follow-up    Labs for thyroid medication change.    HPI:   Christine Manning is a 68 y.o. female who presents for Follow up on hypothyroidism,  right shoulder pain, obesity.  Last TSh was suppressed  In  Late July and dose was reduced.  She had lost 10 bls while thyroid was overactive, but she has continued to lose weight intentionally.    She continues to have right shoulder pain that radiates to 4th and 5th fingers . The pain is constant and she has occasional tremors involving her hand.  She has not  history of trauma or overuse but has had a prior injection in shoulder   Which was done by [employer] over 2 years ago.  The injection, she recalls did alleviate her pain for several months. She reports currently that her pain occurs  with abduction and forward flexion and to 180 deg,.  She cannot  internally rotate shoulder either. .    Past Medical History  Diagnosis Date  . Hyperlipidemia   . Hypertension   . Asthma   . Thyroid  disease   . Personal history of DVT (deep vein thrombosis) 1980    x 2, pregnanacy and immobility induced     Past Surgical History  Procedure Laterality Date  . Bladder suspension  2003  . Abdominal hysterectomy  1991    secondary to abnormal PAPs  . Coronary angioplasty  2013    arteries were clean .  Dr. Rona Ravens at [facility]        The following portions of the patient's history were reviewed and updated as appropriate: Allergies, current medications, and problem list.    Review of Systems:   Patient denies headache, fevers, malaise, unintentional weight loss, skin rash, eye pain, sinus congestion and sinus pain, sore throat, dysphagia,  hemoptysis , cough, dyspnea, wheezing, chest pain, palpitations, orthopnea, edema, abdominal pain, nausea, melena, diarrhea, constipation, flank pain, dysuria, hematuria, urinary  Frequency, nocturia, numbness, tingling, seizures,  Focal weakness, Loss of consciousness,  Tremor, insomnia, depression, anxiety, and suicidal ideation.     History   Social History  . Marital Status: Divorced    Spouse Name: N/A    Number of Children: N/A  . Years of Education: N/A   Occupational History  . Not on file.   Social History Main Topics  . Smoking status: Never Smoker   . Smokeless tobacco: Never Used  . Alcohol Use: No  . Drug Use: No  . Sexual Activity: Not on  file   Other Topics Concern  . 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     Objective:  Filed Vitals:   05/13/14 1449  BP: 120/70  Pulse: 78  Temp: 98.9 F (37.2 C)  Resp: 16     General appearance: alert, cooperative and appears stated age Neck: no adenopathy, no carotid bruit, supple, symmetrical, trachea midline and thyroid not enlarged, symmetric, no tenderness/mass/nodules Back: symmetric, no curvature. ROM normal. No CVA tenderness. Lungs: clear to auscultation bilaterally Heart: regular rate and rhythm, S1, S2 normal, no  murmur, click, rub or gallop Abdomen: soft, non-tender; bowel sounds normal; no masses,  no organomegaly Pulses: 2+ and symmetric Skin: Skin color, texture, turgor normal. No rashes or lesions Lymph nodes: Cervical, supraclavicular, and axillary nodes normal. Musculoskeletal: pain woith active ROM in all fieldds. and limited passive pain free  ROM to 180degrees right shoulder. Internal rotation limited to lower back  Assessment and Plan:  Unspecified hypothyroidism Thyroid function was hyperactive on prior dose and repeat TSh is due in mid September .  No current changes needed.  Lab Results  Component Value Date   TSH 0.008* 04/15/2014     Overweight (BMI 25.0-29.9) I have congratulated her in reduction of   BMI and encouraged  Maintenance of normal wt with  low glycemic index diet and regular exercise a minimum of 5 days per week.    Chronic pain in right shoulder She has elements of impingement as well as sings of a neuropathy.  Referring to ortho for empiric treatment of shoulder,  If symtpoms persistnet will need EMG/[place] sutdies and MRI cervical spine.   NSAIDs and tylenol for pain management currently.   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  If treatment of shoulder pain with intrarticular injection by oethopedics is not helpful may need MRI cervical spine given neuropathy reported today.   Updated Medication List Outpatient Encounter Prescriptions as of 05/13/2014  Medication Sig  . albuterol (PROVENTIL HFA;VENTOLIN HFA) 108 (90 BASE) MCG/ACT inhaler Inhale 2 puffs into the lungs every 6 (six) hours as needed for wheezing or shortness of breath.  Marland Kitchen albuterol (PROVENTIL) (2.5 MG/3ML) 0.083% nebulizer solution Take 3 mLs (2.5 mg total) by nebulization every 6 (six) hours as needed for wheezing or shortness of breath.  Marland Kitchen aspirin 81 MG tablet Take 81 mg by mouth daily.  . ergocalciferol (DRISDOL) 50000  UNITS capsule Take 1 capsule (50,000 Units total) by mouth once a week.  . Fluticasone-Salmeterol (ADVAIR) 250-50 MCG/DOSE AEPB Inhale 1 puff into the lungs every 12 (twelve) hours.  . gabapentin (NEURONTIN) 100 MG capsule Take 1 capsule (100 mg total) by mouth 3 (three) times daily.  . hydrochlorothiazide (HYDRODIURIL) 25 MG tablet Take 1 tablet (25 mg total) by mouth daily.  . hydrOXYzine (ATARAX/VISTARIL) 25 MG tablet Take 25 mg by mouth 3 (three) times daily as needed.  . hyoscyamine (LEVSIN SL) 0.125 MG SL tablet Place 1 tablet (0.125 mg total) under the tongue every 4 (four) hours as needed for cramping.  . ibandronate (BONIVA) 150 MG tablet Take 1 tablet (150 mg total) by mouth every 30 (thirty) days. Take in the morning with a full glass of water, on an empty stomach, and do not take anything else by mouth or lie down for the next 30 min.  Marland Kitchen Lactobacillus-Inulin ([place]) CAPS  Take 1 capsule by mouth daily.  Marland Kitchen levothyroxine (SYNTHROID, LEVOTHROID) 112 MCG tablet Take 1 tablet (112 mcg total) by mouth daily.  Marland Kitchen lisinopril (PRINIVIL,ZESTRIL) 20 MG tablet Take 1 tablet (20 mg total) by mouth daily.  . meclizine (ANTIVERT) 25 MG tablet Take 1 tablet (25 mg total) by mouth 3 (three) times daily as needed for dizziness.  . metoprolol tartrate (LOPRESSOR) 25 MG tablet Take 1 tablet (25 mg total) by mouth 2 (two) times daily.  . mometasone (NASONEX) 50 MCG/ACT nasal spray Place 2 sprays into the nose daily.  . ondansetron (ZOFRAN) 4 MG tablet Take 1 tablet (4 mg total) by mouth every 8 (eight) hours as needed for nausea or vomiting.  Marland Kitchen albuterol (VENTOLIN HFA) 108 (90 BASE) MCG/ACT inhaler Inhale 2 puffs into the lungs every 4 (four) hours as needed.  . dicyclomine (BENTYL) 10 MG capsule Take 1 capsule (10 mg total) by mouth 4 (four) times daily -  before meals and at bedtime.  . meloxicam (MOBIC) 15 MG tablet Take 1 tablet (15 mg total) by mouth daily.  . meloxicam (MOBIC) 15 MG  tablet Take 1 tablet (15 mg total) by mouth daily.  . Multiple Vitamin (MULTIVITAMIN) tablet Take 1 tablet by mouth daily.  . pantoprazole (PROTONIX) 40 MG tablet Take 40 mg by mouth daily.  . predniSONE (STERAPRED UNI-PAK) 10 MG tablet 6 tablets on Day 1 , then reduce by 1 tablet daily until gone  . simvastatin (ZOCOR) 40 MG tablet Take 1 tablet (40 mg total) by mouth every evening.  . Tdap (BOOSTRIX) 5-2.5-18.5 LF-MCG/0.5 injection Inject 0.5 mLs into the muscle once.  . zoster vaccine live, PF, (ZOSTAVAX) 41740 UNT/0.65ML injection Inject 19,400 Units into the skin once.  . [DISCONTINUED] simvastatin (ZOCOR) 40 MG tablet Take 40 mg by mouth every evening.     Orders Placed This Encounter  Procedures  . Ambulatory referral to Gastroenterology    No Follow-up on file.

## 2014-05-13 NOTE — Progress Notes (Signed)
Pre-visit discussion using our clinic review tool. No additional management support is needed unless otherwise documented below in the visit note.  

## 2014-05-13 NOTE — Patient Instructions (Addendum)
Return in late September for fasting labs and thyroid recheck  Your change in bowel habits is concerning; given yoru family history of colon CA,  I am referring you for colonoscopy despite your negative stool cards.   Referral to [place]   for shoulder pain  You can take  15 mg meloxicam daily and 2000 mg of acetominophen (tylenol) daily safely  Referral to [facility] for colonoscopy

## 2014-05-14 ENCOUNTER — Encounter: Payer: Self-pay | Admitting: Internal Medicine

## 2014-05-14 DIAGNOSIS — G8929 Other chronic pain: Secondary | ICD-10-CM | POA: Insufficient documentation

## 2014-05-14 DIAGNOSIS — M25511 Pain in right shoulder: Secondary | ICD-10-CM

## 2014-05-14 NOTE — Assessment & Plan Note (Signed)
Thyroid function was hyperactive on prior dose and repeat TSh is due in mid September .  No current changes needed.  Lab Results  Component Value Date   TSH 0.008* 04/15/2014

## 2014-05-14 NOTE — Assessment & Plan Note (Signed)
She has elements of impingement as well as sings of a neuropathy.  Referring to ortho for empiric treatment of shoulder,  If symtpoms persistnet will need EMG/[place] sutdies and MRI cervical spine.   NSAIDs and tylenol for pain management currently.

## 2014-05-14 NOTE — Assessment & Plan Note (Signed)
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  If treatment of shoulder pain with intrarticular injection by oethopedics is not helpful may need MRI cervical spine given neuropathy reported today.

## 2014-05-14 NOTE — Assessment & Plan Note (Signed)
I have congratulated her in reduction of   BMI and encouraged  Maintenance of normal wt with  low glycemic index diet and regular exercise a minimum of 5 days per week.

## 2014-05-21 ENCOUNTER — Encounter: Payer: Self-pay | Admitting: Internal Medicine

## 2014-05-23 ENCOUNTER — Encounter: Payer: Self-pay | Admitting: *Deleted

## 2014-06-17 ENCOUNTER — Encounter: Payer: Self-pay | Admitting: Internal Medicine

## 2014-06-17 ENCOUNTER — Ambulatory Visit (INDEPENDENT_AMBULATORY_CARE_PROVIDER_SITE_OTHER): Payer: Medicare Other | Admitting: Internal Medicine

## 2014-06-17 VITALS — BP 128/72 | HR 60 | Temp 98.5°F | Resp 16 | Ht 59.0 in | Wt 135.0 lb

## 2014-06-17 DIAGNOSIS — Z862 Personal history of diseases of the blood and blood-forming organs and certain disorders involving the immune mechanism: Secondary | ICD-10-CM

## 2014-06-17 DIAGNOSIS — Z0189 Encounter for other specified special examinations: Secondary | ICD-10-CM

## 2014-06-17 DIAGNOSIS — E039 Hypothyroidism, unspecified: Secondary | ICD-10-CM

## 2014-06-17 DIAGNOSIS — E663 Overweight: Secondary | ICD-10-CM

## 2014-06-17 DIAGNOSIS — R143 Flatulence: Secondary | ICD-10-CM

## 2014-06-17 DIAGNOSIS — R141 Gas pain: Secondary | ICD-10-CM

## 2014-06-17 DIAGNOSIS — E785 Hyperlipidemia, unspecified: Secondary | ICD-10-CM

## 2014-06-17 DIAGNOSIS — I10 Essential (primary) hypertension: Secondary | ICD-10-CM

## 2014-06-17 DIAGNOSIS — Z23 Encounter for immunization: Secondary | ICD-10-CM

## 2014-06-17 DIAGNOSIS — G4700 Insomnia, unspecified: Secondary | ICD-10-CM

## 2014-06-17 DIAGNOSIS — Z8639 Personal history of other endocrine, nutritional and metabolic disease: Secondary | ICD-10-CM

## 2014-06-17 DIAGNOSIS — R079 Chest pain, unspecified: Secondary | ICD-10-CM

## 2014-06-17 DIAGNOSIS — R142 Eructation: Secondary | ICD-10-CM

## 2014-06-17 MED ORDER — TETANUS-DIPHTH-ACELL PERTUSSIS 5-2.5-18.5 LF-MCG/0.5 IM SUSP: 0.5000 mL | Freq: Once | INTRAMUSCULAR | Status: DC

## 2014-06-17 NOTE — Patient Instructions (Signed)
Phasyme  And Gas X to help relieve the  gas   Use Beano with every meal containing vegetables and beans.  One drop at the beginning of the meal.  Melatonin 5 mg trial for insomnia to help turn  clock around   Return on Wednesday for reading ot TB skin test and to provide proof of TdAP

## 2014-06-17 NOTE — Progress Notes (Signed)
Pre-visit discussion using our clinic review tool. No additional management support is needed unless otherwise documented below in the visit note.  

## 2014-06-17 NOTE — Progress Notes (Signed)
Patient ID: Christine Manning, female   DOB: August 15, 1946, 68 y.o.   MRN: [id number] Patient Active Problem List   Diagnosis Date Noted  . Flatulence 06/18/2014  . Insomnia 06/18/2014  . Chronic pain in right shoulder 05/14/2014  . Osteopenia 11/22/2013  . Unspecified vitamin D deficiency 08/28/2013  . Overweight (BMI 25.0-29.9) 08/14/2013  . Chest pain, unspecified 08/14/2013  . S/P hysterectomy with oophorectomy 08/13/2013  . History of thyroid nodule 08/13/2013  . Routine general medical examination at a health care facility 08/13/2013  . Osteoarthritis of left knee 07/16/2013  . Degenerative disk disease 07/16/2013  . Chronic suprapubic pain 07/07/2013  . Back pain with radiation 07/05/2013  . Personal history of DVT (deep vein thrombosis)   . Anxiety 10/11/2012  . Irritable bowel syndrome 10/11/2012  . 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 Hyperlipidemia   . Hypertension   . Asthma   . Unspecified hypothyroidism     Subjective:  CC:   Chief Complaint  Patient presents with  . Follow-up  . Hypothyroidism    HPI:   Christine Manning is a 68 y.o. female who presents for  Follow up on multiple issues including acquired hypothyroidism,  Insomnia secondary to anxiety, and IBS.  Her thyroid was overactive in July and she has been taking a reduced dose of levothyroxicne since then with no change in metabolic symptoms.  She is due for repeat testing.  Patient  has been having some trouble sleeping.  Wakes up 2 to 3 times per night . Bedtime hygiene reviewed,  Patient has been using an electronic book to read before bed.  Does not drink caffeinated beverages after 3 PM.  Only voids  bladder once per night.  No snoring partner. Does not drink alcohol to excess.  Not exercising excessively in the evening. Patient does have a history of anxiety but does not lie awake worrying about issues that cannot be resolved. Does not take stimulants. .   Her recurrent chest pain episodes with  negative cardiac  Workup have resolved with use of PPI.  We discussed discontinuining daily protonix.   IBS:  Patient  has been experiencing excessive gas that has been troubling and hard to control. Diet reviewed and is balanced in favor of mostly vegetarian with legumes and cruciferous vegetables on a daily basis, and a moderate amount of dairy.  Denies constipation    Past Medical History  Diagnosis Date  . Hyperlipidemia   . Hypertension   . Asthma   . Hypothyroidism   . Personal history of DVT (deep vein thrombosis) 1980    x 2, pregnanacy and immobility induced   . DDD (degenerative disc disease)     Past Surgical History  Procedure Laterality Date  . Bladder suspension  2003  . Abdominal hysterectomy  1991    secondary to abnormal PAPs  . Coronary angioplasty  2013    arteries were clean .  Dr. Rona Ravens at [facility]        The following portions of the patient's history were reviewed and updated as appropriate: Allergies, current medications, and problem list.    Review of Systems:   Patient denies headache, fevers, malaise, unintentional weight loss, skin rash, eye pain, sinus congestion and sinus pain, sore throat, dysphagia,  hemoptysis , cough, dyspnea, wheezing, chest pain, palpitations, orthopnea, edema, abdominal pain, nausea, melena, diarrhea, constipation, flank pain, dysuria, hematuria, urinary  Frequency, nocturia, numbness, tingling, seizures,  Focal weakness, Loss of consciousness,  Tremor, insomnia, depression, anxiety, and  suicidal ideation.     History   Social History  . Marital Status: Divorced    Spouse Name: N/A    Number of Children: N/A  . Years of Education: N/A   Occupational History  . Not on file.   Social History Main Topics  . Smoking status: Never Smoker   . Smokeless tobacco: Never Used  . Alcohol Use: No  . Drug Use: No  . Sexual Activity: Not on file   Other Topics Concern  . 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     Objective:  Filed Vitals:   06/17/14 1523  BP: 128/72  Pulse: 60  Temp: 98.5 F (36.9 C)  Resp: 16     General appearance: alert, cooperative and appears stated age Ears: normal TM's and external ear canals both ears Throat: lips, mucosa, and tongue normal; teeth and gums normal Neck: no adenopathy, no carotid bruit, supple, symmetrical, trachea midline and thyroid not enlarged, symmetric, no tenderness/mass/nodules Back: symmetric, no curvature. ROM normal. No CVA tenderness. Lungs: clear to auscultation bilaterally Heart: regular rate and rhythm, S1, S2 normal, no murmur, click, rub or gallop Abdomen: soft, non-tender; bowel sounds normal; no masses,  no organomegaly Pulses: 2+ and symmetric Skin: Skin color, texture, turgor normal. No rashes or lesions Lymph nodes: Cervical, supraclavicular, and axillary nodes normal.  Assessment and Plan:  Unspecified hypothyroidism Thyroid function remains  overactive despite reduction in prior medication .  I have sent a lower dose of levothyroxine to her pharmacy and would like patient to change immediatley.   Overweight (BMI 25.0-29.9) She continues to lose weight, and thyroid remains overactive.  Reduced dose prescribed.  Continue careful deit and regular exercise.  Body mass index is 27.25 kg/(m^2).   History of thyroid nodule Given persistent hypothyroidism, she may need to have her thyroid nodule reevaluated if TSH continues to be suppressed on lower and lower doses of synthroid.   Flatulence Likely secondary to diet.  Beano and Gas X recommended.   Insomnia . Reviewed principles of good sleep hygiene. Advised to avoid use of electronics witiin 2 hours of bedtime, and trial of melatonin 5 mg qhs  Chest pain, unspecified Noncardiac by prior noninvasive workup ,  And resolved with wt loss nad daily use of protonix,  Trial of dc PPI>   A total of 30 minutes was spent with patient more  than half of which was spent in counseling patient on the above mentioned issues , reviewing and explaining recent labs and prior workups done, and coordination of care.  Updated Medication List Outpatient Encounter Prescriptions as of 06/17/2014  Medication Sig  . albuterol (PROVENTIL HFA;VENTOLIN HFA) 108 (90 BASE) MCG/ACT inhaler Inhale 2 puffs into the lungs every 6 (six) hours as needed for wheezing or shortness of breath.  Marland Kitchen albuterol (PROVENTIL) (2.5 MG/3ML) 0.083% nebulizer solution Take 3 mLs (2.5 mg total) by nebulization every 6 (six) hours as needed for wheezing or shortness of breath.  Marland Kitchen albuterol (VENTOLIN HFA) 108 (90 BASE) MCG/ACT inhaler Inhale 2 puffs into the lungs every 4 (four) hours as needed.  Marland Kitchen aspirin 81 MG tablet Take 81 mg by mouth daily.  Marland Kitchen dicyclomine (BENTYL) 10 MG capsule Take 1 capsule (10 mg total) by mouth 4 (four) times daily -  before meals and at bedtime.  . ergocalciferol (DRISDOL) 50000 UNITS capsule Take 1 capsule (50,000 Units total) by mouth once a week.  Marland Kitchen  Fluticasone-Salmeterol (ADVAIR) 250-50 MCG/DOSE AEPB Inhale 1 puff into the lungs every 12 (twelve) hours.  . gabapentin (NEURONTIN) 100 MG capsule Take 1 capsule (100 mg total) by mouth 3 (three) times daily.  . hydrochlorothiazide (HYDRODIURIL) 25 MG tablet Take 1 tablet (25 mg total) by mouth daily.  . hydrOXYzine (ATARAX/VISTARIL) 25 MG tablet Take 25 mg by mouth 3 (three) times daily as needed.  . hyoscyamine (LEVSIN SL) 0.125 MG SL tablet Place 1 tablet (0.125 mg total) under the tongue every 4 (four) hours as needed for cramping.  . ibandronate (BONIVA) 150 MG tablet Take 1 tablet (150 mg total) by mouth every 30 (thirty) days. Take in the morning with a full glass of water, on an empty stomach, and do not take anything else by mouth or lie down for the next 30 min.  . Lactobacillus-Inulin ([place]) CAPS Take 1 capsule by mouth daily.  Marland Kitchen levothyroxine (SYNTHROID, LEVOTHROID) 88  MCG tablet Take 1 tablet (88 mcg total) by mouth daily.  Marland Kitchen lisinopril (PRINIVIL,ZESTRIL) 20 MG tablet Take 1 tablet (20 mg total) by mouth daily.  . meclizine (ANTIVERT) 25 MG tablet Take 1 tablet (25 mg total) by mouth 3 (three) times daily as needed for dizziness.  . meloxicam (MOBIC) 15 MG tablet Take 1 tablet (15 mg total) by mouth daily.  . metoprolol tartrate (LOPRESSOR) 25 MG tablet Take 1 tablet (25 mg total) by mouth 2 (two) times daily.  . mometasone (NASONEX) 50 MCG/ACT nasal spray Place 2 sprays into the nose daily.  . Multiple Vitamin (MULTIVITAMIN) tablet Take 1 tablet by mouth daily.  . ondansetron (ZOFRAN) 4 MG tablet Take 1 tablet (4 mg total) by mouth every 8 (eight) hours as needed for nausea or vomiting.  . pantoprazole (PROTONIX) 40 MG tablet Take 40 mg by mouth daily.  . predniSONE (STERAPRED UNI-PAK) 10 MG tablet 6 tablets on Day 1 , then reduce by 1 tablet daily until gone  . simvastatin (ZOCOR) 40 MG tablet Take 1 tablet (40 mg total) by mouth every evening.  . [DISCONTINUED] levothyroxine (SYNTHROID, LEVOTHROID) 112 MCG tablet Take 1 tablet (112 mcg total) by mouth daily.  . [DISCONTINUED] meloxicam (MOBIC) 15 MG tablet Take 1 tablet (15 mg total) by mouth daily.  . Tdap (BOOSTRIX) 5-2.5-18.5 LF-MCG/0.5 injection Inject 0.5 mLs into the muscle once.  . Tdap (BOOSTRIX) 5-2.5-18.5 LF-MCG/0.5 injection Inject 0.5 mLs into the muscle once.  . zoster vaccine live, PF, (ZOSTAVAX) 10258 UNT/0.65ML injection Inject 19,400 Units into the skin once.     Orders Placed This Encounter  Procedures  . TB Skin Test    Return in about 6 months (around 12/16/2014).

## 2014-06-18 DIAGNOSIS — R143 Flatulence: Secondary | ICD-10-CM | POA: Insufficient documentation

## 2014-06-18 DIAGNOSIS — G4700 Insomnia, unspecified: Secondary | ICD-10-CM | POA: Insufficient documentation

## 2014-06-18 LAB — LIPID PANEL
CHOL/HDL RATIO: 5
Cholesterol: 226 mg/dL — ABNORMAL HIGH (ref 0–200)
HDL: 41.7 mg/dL (ref >39.00)
LDL CALC: 151 mg/dL — AB (ref 0–99)
NonHDL: 184.3
Triglycerides: 167 mg/dL — ABNORMAL HIGH (ref 0.0–149.0)
VLDL: 33.4 mg/dL (ref 0.0–40.0)

## 2014-06-18 LAB — COMPREHENSIVE METABOLIC PANEL
ALK PHOS: 56 U/L (ref 39–117)
ALT: 18 U/L (ref 0–35)
AST: 26 U/L (ref 0–37)
Albumin: 3.9 g/dL (ref 3.5–5.2)
BUN: 15 mg/dL (ref 6–23)
CALCIUM: 9.4 mg/dL (ref 8.4–10.5)
CO2: 27 mEq/L (ref 19–32)
Chloride: 107 mEq/L (ref 96–112)
Creatinine, Ser: 1 mg/dL (ref 0.4–1.2)
GFR: 72.85 mL/min (ref >60.00)
Glucose, Bld: 93 mg/dL (ref 70–99)
Potassium: 4.3 mEq/L (ref 3.5–5.1)
Sodium: 141 mEq/L (ref 135–145)
Total Bilirubin: 0.6 mg/dL (ref 0.2–1.2)
Total Protein: 6.9 g/dL (ref 6.0–8.3)

## 2014-06-18 LAB — T4 AND TSH
T4, Total: 12.7 ug/dL — ABNORMAL HIGH (ref 4.5–12.0)
TSH: 0.005 u[IU]/mL — ABNORMAL LOW (ref 0.450–4.500)

## 2014-06-18 MED ORDER — LEVOTHYROXINE SODIUM 88 MCG PO TABS: 88.0000 ug | ORAL_TABLET | Freq: Every day | ORAL | Status: DC

## 2014-06-18 NOTE — Assessment & Plan Note (Signed)
.   Reviewed principles of good sleep hygiene. Advised to avoid use of electronics witiin 2 hours of bedtime, and trial of melatonin 5 mg qhs

## 2014-06-18 NOTE — Assessment & Plan Note (Signed)
Given persistent hypothyroidism, she may need to have her thyroid nodule reevaluated if TSH continues to be suppressed on lower and lower doses of synthroid.

## 2014-06-18 NOTE — Addendum Note (Signed)
Addended by: Crecencio Mc on: 06/18/2014 07:23 AM   Modules accepted: Orders

## 2014-06-18 NOTE — Assessment & Plan Note (Signed)
Likely secondary to diet.  Beano and Gas X recommended.

## 2014-06-18 NOTE — Assessment & Plan Note (Signed)
Noncardiac by prior noninvasive workup ,  And resolved with wt loss nad daily use of protonix,  Trial of dc PPI>

## 2014-06-18 NOTE — Assessment & Plan Note (Signed)
Thyroid function remains  overactive despite reduction in prior medication .  I have sent a lower dose of levothyroxine to her pharmacy and would like patient to change immediatley.

## 2014-06-18 NOTE — Assessment & Plan Note (Signed)
She continues to lose weight, and thyroid remains overactive.  Reduced dose prescribed.  Continue careful deit and regular exercise.  Body mass index is 27.25 kg/(m^2).

## 2014-06-19 LAB — TB SKIN TEST
Induration: 0 mm
TB SKIN TEST: NEGATIVE

## 2014-07-25 ENCOUNTER — Other Ambulatory Visit (INDEPENDENT_AMBULATORY_CARE_PROVIDER_SITE_OTHER): Payer: Medicare Other

## 2014-07-25 ENCOUNTER — Other Ambulatory Visit: Payer: Medicare Other

## 2014-07-25 DIAGNOSIS — E039 Hypothyroidism, unspecified: Secondary | ICD-10-CM

## 2014-07-26 ENCOUNTER — Ambulatory Visit (INDEPENDENT_AMBULATORY_CARE_PROVIDER_SITE_OTHER): Payer: Medicare Other | Admitting: Internal Medicine

## 2014-07-26 ENCOUNTER — Encounter: Payer: Self-pay | Admitting: Internal Medicine

## 2014-07-26 VITALS — BP 148/82 | HR 64 | Ht 59.0 in | Wt 136.5 lb

## 2014-07-26 DIAGNOSIS — R159 Full incontinence of feces: Secondary | ICD-10-CM

## 2014-07-26 DIAGNOSIS — K589 Irritable bowel syndrome without diarrhea: Secondary | ICD-10-CM

## 2014-07-26 DIAGNOSIS — R197 Diarrhea, unspecified: Secondary | ICD-10-CM

## 2014-07-26 LAB — T4 AND TSH
T4 TOTAL: 9.1 ug/dL (ref 4.5–12.0)
TSH: 1.14 u[IU]/mL (ref 0.450–4.500)

## 2014-07-26 MED ORDER — MOVIPREP 100 G PO SOLR: 1.0000 | Freq: Once | ORAL | Status: DC

## 2014-07-26 MED ORDER — DICYCLOMINE HCL 20 MG PO TABS: 20.0000 mg | ORAL_TABLET | ORAL | Status: DC

## 2014-07-26 NOTE — Progress Notes (Addendum)
Christine Manning 02-25-1946 [id number]  Note: This dictation was prepared with Dragon digital system. Any transcriptional errors that result from this procedure are unintentional.   History of Present Illness:  This is a 68 year old African-American female retired from  [employer] 4 years ago. She has a history of irritable bowel syndrome with predominant diarrhea. We saw her in November 2007, she had a normal colonoscopy. She was treated with Levbid 0.375 mg daily. She describes leakage of stool as well as fecal urgency and unpredictable bowel movements. She denies ever being constipated. She denies rectal bleeding. There is a family history of colon cancer in an aunt and in a paternal cousin. She had a prior colonoscopy in 2000 and By Dr.Morrisey in [place] were several polyps were removed. We don't have a record of it but have requested those. Her weight has been stable. She denies excessive stress. She has been trying to follow a high fiber diet. She has a history of hypothyroidism and most recently hyperparathyroidism.    Past Medical History  Diagnosis Date  . Hyperlipidemia   . Hypertension   . Asthma   . Personal history of DVT (deep vein thrombosis) 1980    x 2, pregnanacy and immobility induced   . DDD (degenerative disc disease)   . Hyperthyroidism   . Colon polyp     Past Surgical History  Procedure Laterality Date  . Bladder suspension  2003  . Abdominal hysterectomy  1991    secondary to abnormal PAPs  . Coronary angioplasty  2013    arteries were clean .  Dr. Rona Ravens at [facility]     Allergies  Allergen Reactions  . Codeine Hives and Shortness Of Breath  . Latex Hives and Swelling  . Augmentin [Amoxicillin-Pot Clavulanate] Other (See Comments)    Causes IBS  . Morphine And Related Nausea And Vomiting    Family history and social history have been reviewed.  Review of Systems: occasional fecal incontinence. Fecal urgency. Denies abdominal pain  The remainder of the 10  point ROS is negative except as outlined in the H&P  Physical Exam: General Appearance Well developed, in no distress Eyes  Non icteric  HEENT  Non traumatic, normocephalic  Mouth No lesion, tongue papillated, no cheilosis Neck Supple without adenopathy, thyroid not enlarged, no carotid bruits, no JVD Lungs Clear to auscultation bilaterally COR Normal S1, normal S2, regular rhythm, no murmur, quiet precordium Abdomen soft relaxed with the quiet bowel sounds. Mild tenderness in epigastrium. No tenderness in the left or right lower quadrants. No distention or tympany Rectal small external hemorrhoids. Decreased rectal sphincter tone. Small amount of yellow Hemoccult-negative stool Extremities  No pedal edema. Skin No lesions Neurological Alert and oriented x 3 Psychological Normal mood and affect  Assessment and Plan:   Problem #68 68 year old African-American female with change in bowel habits and a known history of irritable bowel syndrome with predominant diarrhea. She was in the past treated with antispasmodics but no longer takes them. She has a family history of colon cancer in 2 indirect relatives and is interested in a follow-up colonoscopy. We will go ahead  With screening colonoscopy and biopsies to r/o  microscopic colitis. 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 I have again emphasized a high-fiber diet . Part of the problem with fecal urgency and incontinence is due to decreased rectal sphincter tone. I have reminded her to stay active and exercise  pelvic floor muscles.  I have reviewed  An EGD report from [id number], from Dr Nicolasa Ducking: Gastric diverticulum, biopsy  Positive for  H.Pylori . Not sure if she was treated. Colonoscopy  2002 Dr Clydene Laming- pedunculated polyp- Path report revealed  Fecal  matter  Delfin Edis 07/26/2014

## 2014-07-26 NOTE — Patient Instructions (Addendum)
You have been scheduled for a colonoscopy. Please follow written instructions given to you at your visit today.  Please pick up your prep kit at the pharmacy within the next 1-3 days. If you use inhalers (even only as needed), please bring them with you on the day of your procedure. Your physician has requested that you go to www.startemmi.com and enter the access code given to you at your visit today. This web site gives a general overview about your procedure. However, you should still follow specific instructions given to you by our office regarding your preparation for the procedure.  We have sent the following medications to your pharmacy for you to pick up at your convenience: Bentyl 20 mg every morning  CC:Dr Tullo

## 2014-07-27 ENCOUNTER — Encounter: Payer: Self-pay | Admitting: Internal Medicine

## 2014-07-29 ENCOUNTER — Encounter: Payer: Self-pay | Admitting: *Deleted

## 2014-08-01 ENCOUNTER — Encounter: Payer: Self-pay | Admitting: Internal Medicine

## 2014-08-19 ENCOUNTER — Encounter: Payer: Medicare Other | Admitting: Internal Medicine

## 2014-08-28 ENCOUNTER — Telehealth: Payer: Self-pay | Admitting: Internal Medicine

## 2014-08-28 ENCOUNTER — Encounter: Payer: Medicare Other | Admitting: Internal Medicine

## 2014-08-28 NOTE — Telephone Encounter (Signed)
Please charge no show fee.

## 2014-09-06 ENCOUNTER — Encounter: Payer: Self-pay | Admitting: Internal Medicine

## 2014-09-06 ENCOUNTER — Ambulatory Visit (INDEPENDENT_AMBULATORY_CARE_PROVIDER_SITE_OTHER): Payer: Medicare Other | Admitting: Internal Medicine

## 2014-09-06 VITALS — BP 136/82 | HR 68 | Temp 97.8°F | Resp 14 | Ht 58.75 in | Wt 138.5 lb

## 2014-09-06 DIAGNOSIS — N393 Stress incontinence (female) (male): Secondary | ICD-10-CM

## 2014-09-06 DIAGNOSIS — E039 Hypothyroidism, unspecified: Secondary | ICD-10-CM

## 2014-09-06 DIAGNOSIS — E034 Atrophy of thyroid (acquired): Secondary | ICD-10-CM

## 2014-09-06 DIAGNOSIS — M8580 Other specified disorders of bone density and structure, unspecified site: Secondary | ICD-10-CM

## 2014-09-06 DIAGNOSIS — E038 Other specified hypothyroidism: Secondary | ICD-10-CM

## 2014-09-06 DIAGNOSIS — Z87448 Personal history of other diseases of urinary system: Secondary | ICD-10-CM

## 2014-09-06 DIAGNOSIS — Z0000 Encounter for general adult medical examination without abnormal findings: Secondary | ICD-10-CM

## 2014-09-06 DIAGNOSIS — Z9289 Personal history of other medical treatment: Secondary | ICD-10-CM

## 2014-09-06 DIAGNOSIS — Z9889 Other specified postprocedural states: Secondary | ICD-10-CM

## 2014-09-06 NOTE — Patient Instructions (Addendum)
Referral to Urogynecology and Sports medicine are underway for your stress incontinence and right shoulder pain   We are repeating your thyroid function today    We will refill your medications   YOU DO NOT HAVE A CERVIX so you do not require PAP smears anymore  Health Maintenance Adopting a healthy lifestyle and getting preventive care can go a long way to promote health and wellness. Talk with your health care provider about what schedule of regular examinations is right for you. This is a good chance for you to check in with your provider about disease prevention and staying healthy. In between checkups, there are plenty of things you can do on your own. Experts have done a lot of research about which lifestyle changes and preventive measures are most likely to keep you healthy. Ask your health care provider for more information. WEIGHT AND DIET  Eat a healthy diet  Be sure to include plenty of vegetables, fruits, low-fat dairy products, and lean protein.  Do not eat a lot of foods high in solid fats, added sugars, or salt.  Get regular exercise. This is one of the most important things you can do for your health.  Most adults should exercise for at least 150 minutes each week. The exercise should increase your heart rate and make you sweat (moderate-intensity exercise).  Most adults should also do strengthening exercises at least twice a week. This is in addition to the moderate-intensity exercise.  Maintain a healthy weight  Body mass index (BMI) is a measurement that can be used to identify possible weight problems. It estimates body fat based on height and weight. Your health care provider can help determine your BMI and help you achieve or maintain a healthy weight.  For females 77 years of age and older:   A BMI below 18.5 is considered underweight.  A BMI of 18.5 to 24.9 is normal.  A BMI of 25 to 29.9 is considered overweight.  A BMI of 30 and above is considered  obese.  Watch levels of cholesterol and blood lipids  You should start having your blood tested for lipids and cholesterol at 67 years of age, then have this test every 5 years.  You may need to have your cholesterol levels checked more often if:  Your lipid or cholesterol levels are high.  You are older than 67 years of age.  You are at high risk for heart disease.  CANCER SCREENING   Lung Cancer  Lung cancer screening is recommended for adults 13-30 years old who are at high risk for lung cancer because of a history of smoking.  A yearly low-dose CT scan of the lungs is recommended for people who:  Currently smoke.  Have quit within the past 15 years.  Have at least a 30-pack-year history of smoking. A pack year is smoking an average of one pack of cigarettes a day for 1 year.  Yearly screening should continue until it has been 15 years since you quit.  Yearly screening should stop if you develop a health problem that would prevent you from having lung cancer treatment.  Breast Cancer  Practice breast self-awareness. This means understanding how your breasts normally appear and feel.  It also means doing regular breast self-exams. Let your health care provider know about any changes, no matter how small.  If you are in your 20s or 30s, you should have a clinical breast exam (CBE) by a health care provider every 1-3 years as  part of a regular health exam.  If you are 40 or older, have a CBE every year. Also consider having a breast X-ray (mammogram) every year.  If you have a family history of breast cancer, talk to your health care provider about genetic screening.  If you are at high risk for breast cancer, talk to your health care provider about having an MRI and a mammogram every year.  Breast cancer gene (BRCA) assessment is recommended for women who have family members with BRCA-related cancers. BRCA-related cancers  include:  Breast.  Ovarian.  Tubal.  Peritoneal cancers.  Results of the assessment will determine the need for genetic counseling and BRCA1 and BRCA2 testing. Cervical Cancer Routine pelvic examinations to screen for cervical cancer are no longer recommended for nonpregnant women who are considered low risk for cancer of the pelvic organs (ovaries, uterus, and vagina) and who do not have symptoms. A pelvic examination may be necessary if you have symptoms including those associated with pelvic infections. Ask your health care provider if a screening pelvic exam is right for you.   The Pap test is the screening test for cervical cancer for women who are considered at risk.  If you had a hysterectomy for a problem that was not cancer or a condition that could lead to cancer, then you no longer need Pap tests.  If you are older than 65 years, and you have had normal Pap tests for the past 10 years, you no longer need to have Pap tests.  If you have had past treatment for cervical cancer or a condition that could lead to cancer, you need Pap tests and screening for cancer for at least 20 years after your treatment.  If you no longer get a Pap test, assess your risk factors if they change (such as having a new sexual partner). This can affect whether you should start being screened again.  Some women have medical problems that increase their chance of getting cervical cancer. If this is the case for you, your health care provider may recommend more frequent screening and Pap tests.  The human papillomavirus (HPV) test is another test that may be used for cervical cancer screening. The HPV test looks for the virus that can cause cell changes in the cervix. The cells collected during the Pap test can be tested for HPV.  The HPV test can be used to screen women 30 years of age and older. Getting tested for HPV can extend the interval between normal Pap tests from three to five years.  An HPV  test also should be used to screen women of any age who have unclear Pap test results.  After 67 years of age, women should have HPV testing as often as Pap tests.  Colorectal Cancer  This type of cancer can be detected and often prevented.  Routine colorectal cancer screening usually begins at 67 years of age and continues through 68 years of age.  Your health care provider may recommend screening at an earlier age if you have risk factors for colon cancer.  Your health care provider may also recommend using home test kits to check for hidden blood in the stool.  A small camera at the end of a tube can be used to examine your colon directly (sigmoidoscopy or colonoscopy). This is done to check for the earliest forms of colorectal cancer.  Routine screening usually begins at age 23.  Direct examination of the colon should be repeated every 5-10  years through 67 years of age. However, you may need to be screened more often if early forms of precancerous polyps or small growths are found. Skin Cancer  Check your skin from head to toe regularly.  Tell your health care provider about any new moles or changes in moles, especially if there is a change in a mole's shape or color.  Also tell your health care provider if you have a mole that is larger than the size of a pencil eraser.  Always use sunscreen. Apply sunscreen liberally and repeatedly throughout the day.  Protect yourself by wearing long sleeves, pants, a wide-brimmed hat, and sunglasses whenever you are outside. HEART DISEASE, DIABETES, AND HIGH BLOOD PRESSURE   Have your blood pressure checked at least every 1-2 years. High blood pressure causes heart disease and increases the risk of stroke.  If you are between 28 years and 82 years old, ask your health care provider if you should take aspirin to prevent strokes.  Have regular diabetes screenings. This involves taking a blood sample to check your fasting blood sugar  level.  If you are at a normal weight and have a low risk for diabetes, have this test once every three years after 67 years of age.  If you are overweight and have a high risk for diabetes, consider being tested at a younger age or more often. PREVENTING INFECTION  Hepatitis B  If you have a higher risk for hepatitis B, you should be screened for this virus. You are considered at high risk for hepatitis B if:  You were born in a country where hepatitis B is common. Ask your health care provider which countries are considered high risk.  Your parents were born in a high-risk country, and you have not been immunized against hepatitis B (hepatitis B vaccine).  You have HIV or AIDS.  You use needles to inject street drugs.  You live with someone who has hepatitis B.  You have had sex with someone who has hepatitis B.  You get hemodialysis treatment.  You take certain medicines for conditions, including cancer, organ transplantation, and autoimmune conditions. Hepatitis C  Blood testing is recommended for:  Everyone born from [birth year] through 1964.  Anyone with known risk factors for hepatitis C. Sexually transmitted infections (STIs)  You should be screened for sexually transmitted infections (STIs) including gonorrhea and chlamydia if:  You are sexually active and are younger than 67 years of age.  You are older than 67 years of age and your health care provider tells you that you are at risk for this type of infection.  Your sexual activity has changed since you were last screened and you are at an increased risk for chlamydia or gonorrhea. Ask your health care provider if you are at risk.  If you do not have HIV, but are at risk, it may be recommended that you take a prescription medicine daily to prevent HIV infection. This is called pre-exposure prophylaxis (PrEP). You are considered at risk if:  You are sexually active and do not regularly use condoms or know the HIV status  of your partner(s).  You take drugs by injection.  You are sexually active with a partner who has HIV. Talk with your health care provider about whether you are at high risk of being infected with HIV. If you choose to begin PrEP, you should first be tested for HIV. You should then be tested every 3 months for as long as you are  taking PrEP.  PREGNANCY   If you are premenopausal and you may become pregnant, ask your health care provider about preconception counseling.  If you may become pregnant, take 400 to 800 micrograms (mcg) of folic acid every day.  If you want to prevent pregnancy, talk to your health care provider about birth control (contraception). OSTEOPOROSIS AND MENOPAUSE   Osteoporosis is a disease in which the bones lose minerals and strength with aging. This can result in serious bone fractures. Your risk for osteoporosis can be identified using a bone density scan.  If you are 12 years of age or older, or if you are at risk for osteoporosis and fractures, ask your health care provider if you should be screened.  Ask your health care provider whether you should take a calcium or vitamin D supplement to lower your risk for osteoporosis.  Menopause may have certain physical symptoms and risks.  Hormone replacement therapy may reduce some of these symptoms and risks. Talk to your health care provider about whether hormone replacement therapy is right for you.  HOME CARE INSTRUCTIONS   Schedule regular health, dental, and eye exams.  Stay current with your immunizations.   Do not use any tobacco products including cigarettes, chewing tobacco, or electronic cigarettes.  If you are pregnant, do not drink alcohol.  If you are breastfeeding, limit how much and how often you drink alcohol.  Limit alcohol intake to no more than 1 drink per day for nonpregnant women. One drink equals 12 ounces of beer, 5 ounces of wine, or 1 ounces of hard liquor.  Do not use street  drugs.  Do not share needles.  Ask your health care provider for help if you need support or information about quitting drugs.  Tell your health care provider if you often feel depressed.  Tell your health care provider if you have ever been abused or do not feel safe at home. Document Released: 03/22/2011 Document Revised: 01/21/2014 Document Reviewed: 08/08/2013 [facility] Patient Information 2015 [place], [place]. This information is not intended to replace advice given to you by your health care provider. Make sure you discuss any questions you have with your health care provider.

## 2014-09-06 NOTE — Progress Notes (Signed)
Pre visit review using our clinic review tool, if applicable. No additional management support is needed unless otherwise documented below in the visit note. 

## 2014-09-06 NOTE — Progress Notes (Signed)
Patient ID: Christine Manning, female   DOB: Jun 11, 1946, 68 y.o.   MRN: [id number]  The patient is here for annual Medicare wellness examination and management of other chronic and acute problems.   The risk factors are reflected in the social history.  The roster of all physicians providing medical care to patient - is listed in the Snapshot section of the chart.  Activities of daily living:  The patient is 100% independent in all ADLs: dressing, toileting, feeding as well as independent mobility  Home safety : The patient has smoke detectors in the home. They wear seatbelts.  There are no firearms at home. There is no violence in the home.   There is no risks for hepatitis, STDs or HIV. There is no   history of blood transfusion. They have no travel history to infectious disease endemic areas of the world.  The patient has seen their dentist in the last six month. They have seen their eye doctor in the last year. They admit to slight hearing difficulty with regard to whispered voices and some television programs.  They have deferred audiologic testing in the last year.  They do not  have excessive sun exposure. Discussed the need for sun protection: hats, long sleeves and use of sunscreen if there is significant sun exposure.   Diet: the importance of a healthy diet is discussed. They do have a healthy diet.  The benefits of regular aerobic exercise were discussed. She walks 4 times per week ,  20 minutes.   Depression screen: there are no signs or vegative symptoms of depression- irritability, change in appetite, anhedonia, sadness/tearfullness.  Cognitive assessment: the patient manages all their financial and personal affairs and is actively engaged. They could relate day,date,year and events; recalled 2/3 objects at 3 minutes; performed clock-face test normally.  The following portions of the patient's history were reviewed and updated as appropriate: allergies, current medications, past  family history, past medical history,  past surgical history, past social history  and problem list.  Visual acuity was not assessed per patient preference since she has regular follow up with her ophthalmologist. Hearing and body mass index were assessed and reviewed.   During the course of the visit the patient was educated and counseled about appropriate screening and preventive services including : fall prevention , diabetes screening, nutrition counseling, colorectal cancer screening, and recommended immunizations.    Objective:  BP 136/82 mmHg  Pulse 68  Temp(Src) 97.8 F (36.6 C) (Oral)  Resp 14  Ht 4' 10.75" (1.492 m)  Wt 138 lb 8 oz (62.823 kg)  BMI 28.22 kg/m2  SpO2 97%  General Appearance:    Alert, cooperative, no distress, appears stated age  Head:    Normocephalic, without obvious abnormality, atraumatic  Eyes:    PERRL, conjunctiva/corneas clear, EOM's intact, fundi    benign, both eyes  Ears:    Normal TM's and external ear canals, both ears  Nose:   Nares normal, septum midline, mucosa normal, no drainage    or sinus tenderness  Throat:   Lips, mucosa, and tongue normal; teeth and gums normal  Neck:   Supple, symmetrical, trachea midline, no adenopathy;    thyroid:  no enlargement/tenderness/nodules; no carotid   bruit or JVD  Back:     Symmetric, no curvature, ROM normal, no CVA tenderness  Lungs:     Clear to auscultation bilaterally, respirations unlabored  Chest Wall:    No tenderness or deformity   Heart:  Regular rate and rhythm, S1 and S2 normal, no murmur, rub   or gallop  Breast Exam:    No tenderness, masses, or nipple abnormality  Abdomen:     Soft, non-tender, bowel sounds active all four quadrants,    no masses, no organomegaly  Genitalia:    Pelvic: cervix surgically absent , external genitalia normal, no adnexal masses or tenderness, no cervical motion tenderness, rectovaginal septum normal, uterus normal size, shape, and consistency and vagina  normal without discharge  Extremities:   Extremities normal, atraumatic, no cyanosis or edema  Pulses:   2+ and symmetric all extremities  Skin:   Skin color, texture, turgor normal, no rashes or lesions  Lymph nodes:   Cervical, supraclavicular, and axillary nodes normal  Neurologic:   CNII-XII intact, normal strength, sensation and reflexes    throughout     Assessment and Plan:  Problem List Items Addressed This Visit      Endocrine   Hypothyroidism    Thyroid function is WNL on current dose.  No current changes needed.   Lab Results  Component Value Date   TSH 0.451 09/06/2014       Relevant Orders      T4, free (Completed)      TSH (Completed)      T4, free (Completed)      TSH (Completed)     Musculoskeletal and Integument   Osteopenia    Has taken oral Boniva since 1998.  She will need to take a holiday from boniva and repeat the DEXA in one year.         Other   Encounter for Medicare annual wellness exam    Annual Medicare wellness  exam was done as well as a comprehensive physical exam and management of acute and chronic conditions .  During the course of the visit the patient was educated and counseled about appropriate screening and preventive services including : fall prevention , diabetes screening, nutrition counseling, colorectal cancer screening, and recommended immunizations.  Printed recommendations for health maintenance screenings was given.     History of bladder suspension procedure   SI (stress incontinence), female    New problem, with history of bladder suspension procedure done several years ago during total hysterectomy.  Referral to [facility] underway    Relevant Orders      Ambulatory referral to Urogynecology    Other Visit Diagnoses    Other specified hypothyroidism    -  Primary    Relevant Orders       T4, free (Completed)       TSH (Completed)     '

## 2014-09-07 LAB — TSH: TSH: 0.451 u[IU]/mL (ref 0.350–4.500)

## 2014-09-07 LAB — T4, FREE: FREE T4: 1.17 ng/dL (ref 0.80–1.80)

## 2014-09-08 DIAGNOSIS — Z9889 Other specified postprocedural states: Secondary | ICD-10-CM | POA: Insufficient documentation

## 2014-09-08 DIAGNOSIS — N393 Stress incontinence (female) (male): Secondary | ICD-10-CM | POA: Insufficient documentation

## 2014-09-08 DIAGNOSIS — Z87448 Personal history of other diseases of urinary system: Secondary | ICD-10-CM | POA: Insufficient documentation

## 2014-09-08 NOTE — Assessment & Plan Note (Signed)
Has taken oral Boniva since 1998.  Marland Kitchen

## 2014-09-08 NOTE — Assessment & Plan Note (Signed)
New problem, with history of bladder suspension procedure done several years ago during total hysterectomy.  Referral to [facility] underway

## 2014-09-08 NOTE — Assessment & Plan Note (Signed)

## 2014-09-08 NOTE — Assessment & Plan Note (Signed)
Thyroid function is WNL on current dose.  No current changes needed.   Lab Results  Component Value Date   TSH 0.451 09/06/2014

## 2014-09-14 ENCOUNTER — Encounter: Payer: Self-pay | Admitting: Internal Medicine

## 2014-09-20 HISTORY — PX: COLONOSCOPY: SHX174

## 2014-09-25 ENCOUNTER — Other Ambulatory Visit: Payer: Self-pay | Admitting: Internal Medicine

## 2014-10-18 ENCOUNTER — Other Ambulatory Visit: Payer: Self-pay | Admitting: *Deleted

## 2014-10-18 ENCOUNTER — Telehealth: Payer: Self-pay | Admitting: Internal Medicine

## 2014-10-18 MED ORDER — METOPROLOL TARTRATE 25 MG PO TABS: 25.0000 mg | ORAL_TABLET | Freq: Two times a day (BID) | ORAL | Status: DC

## 2014-10-18 MED ORDER — LISINOPRIL 20 MG PO TABS: 20.0000 mg | ORAL_TABLET | Freq: Every day | ORAL | Status: DC

## 2014-10-18 NOTE — Telephone Encounter (Signed)
Rx sent 

## 2014-10-18 NOTE — Telephone Encounter (Signed)
lisinopril (PRINIVIL,ZESTRIL) 20 MG tablet  metoprolol tartrate (LOPRESSOR) 25 MG tablet

## 2014-10-28 ENCOUNTER — Ambulatory Visit (AMBULATORY_SURGERY_CENTER): Payer: Self-pay | Admitting: *Deleted

## 2014-10-28 VITALS — Ht 59.0 in | Wt 144.0 lb

## 2014-10-28 DIAGNOSIS — K589 Irritable bowel syndrome without diarrhea: Secondary | ICD-10-CM

## 2014-10-28 DIAGNOSIS — Z1211 Encounter for screening for malignant neoplasm of colon: Secondary | ICD-10-CM

## 2014-10-28 NOTE — Progress Notes (Signed)
Patient denies any allergies to eggs or soy. Patient denies any problems with anesthesia/sedation. Patient denies any oxygen use at home and does not take any diet/weight loss medications. EMMI education assisgned to patient on colonoscopy, this was explained and instructions given to patient. 

## 2014-11-07 ENCOUNTER — Encounter: Payer: Self-pay | Admitting: Internal Medicine

## 2014-11-15 ENCOUNTER — Encounter: Payer: Self-pay | Admitting: Internal Medicine

## 2014-11-15 ENCOUNTER — Ambulatory Visit (AMBULATORY_SURGERY_CENTER): Payer: Medicare Other | Admitting: Internal Medicine

## 2014-11-15 VITALS — BP 125/80 | HR 58 | Temp 98.5°F | Resp 18 | Ht 58.75 in | Wt 138.8 lb

## 2014-11-15 DIAGNOSIS — D123 Benign neoplasm of transverse colon: Secondary | ICD-10-CM

## 2014-11-15 DIAGNOSIS — Z1211 Encounter for screening for malignant neoplasm of colon: Secondary | ICD-10-CM

## 2014-11-15 MED ORDER — SODIUM CHLORIDE 0.9 % IV SOLN: 500.0000 mL | INTRAVENOUS | Status: DC

## 2014-11-15 NOTE — Op Note (Signed)
[place]  [address] [place], [address]   COLONOSCOPY PROCEDURE REPORT  PATIENT: Christine Manning  MR#: [id number] BIRTHDATE: 10/01/1945 , 68  yrs. old GENDER: female ENDOSCOPIST: Lafayette Dragon, MD REFERRED TX:MIWOEH Derrel Nip, M.D. PROCEDURE DATE:  11/15/2014 PROCEDURE:   Colonoscopy with snare polypectomy First Screening Colonoscopy - Avg.  risk and is 50 yrs.  old or older - No.  Prior Negative Screening - Now for repeat screening. N/A  History of Adenoma - Now for follow-up colonoscopy & has been > or = to 3 yrs.  N/A  Polyps Removed Today? Yes. ASA CLASS:   Class II INDICATIONS:average risk patient for colon cancer and prior colonoscopy in [place] in 2002 polyps were removed.  Last colonoscopy 2007 was a normal exam.  There is a positive family history of colon cancer in aunt and paternal cousin. MEDICATIONS: Monitored anesthesia care and Propofol 160 mg IV  DESCRIPTION OF PROCEDURE:   After the risks benefits and alternatives of the procedure were thoroughly explained, informed consent was obtained.  The digital rectal exam revealed no abnormalities of the rectum.   The LB [id number]  endoscope was introduced through the anus and advanced to the cecum, which was identified by both the appendix and ileocecal valve. No adverse events experienced.   The quality of the prep was good, using MoviPrep  The instrument was then slowly withdrawn as the colon was fully examined.      COLON FINDINGS: A firm sessile polyp measuring 7 mm in size was found in the transverse colon.  A polypectomy was performed with a cold snare.  The resection was complete, the polyp tissue was completely retrieved and sent to histology.  Retroflexed views revealed no abnormalities. The time to cecum=3 minutes 41 seconds. Withdrawal time=8 minutes 45 seconds.  The scope was withdrawn and the procedure completed. COMPLICATIONS: There were no immediate  complications.  ENDOSCOPIC IMPRESSION: Sessile polyp was found in the transverse colon; polypectomy was performed with a cold snare  RECOMMENDATIONS: 1.  Await pathology results 2.  High fiber diet Recall colonoscopy pending path report  eSigned:  Lafayette Dragon, MD 11/15/2014 10:48 AM   cc:

## 2014-11-15 NOTE — Progress Notes (Signed)
A/ox3 pleased with MAC, report to Robbin RN 

## 2014-11-15 NOTE — Progress Notes (Signed)
Called to room to assist during endoscopic procedure.  Patient ID and intended procedure confirmed with present staff. Received instructions for my participation in the procedure from the performing physician.  

## 2014-11-15 NOTE — Patient Instructions (Signed)
Colon polyp removed today, handouts given on polyps, and high fiber diet. Try to follow high fiber diet. Call us with any questions or concerns. Thank you!  YOU HAD AN ENDOSCOPIC PROCEDURE TODAY AT [facility]: Refer to the procedure report that was given to you for any specific questions about what was found during the examination.  If the procedure report does not answer your questions, please call your gastroenterologist to clarify.  If you requested that your care partner not be given the details of your procedure findings, then the procedure report has been included in a sealed envelope for you to review at your convenience later.  YOU SHOULD EXPECT: Some feelings of bloating in the abdomen. Passage of more gas than usual.  Walking can help get rid of the air that was put into your GI tract during the procedure and reduce the bloating. If you had a lower endoscopy (such as a colonoscopy or flexible sigmoidoscopy) you may notice spotting of blood in your stool or on the toilet paper. If you underwent a bowel prep for your procedure, then you may not have a normal bowel movement for a few days.  DIET: Your first meal following the procedure should be a light meal and then it is ok to progress to your normal diet.  A half-sandwich or bowl of soup is an example of a good first meal.  Heavy or fried foods are harder to digest and may make you feel nauseous or bloated.  Likewise meals heavy in dairy and vegetables can cause extra gas to form and this can also increase the bloating.  Drink plenty of fluids but you should avoid alcoholic beverages for 24 hours.  ACTIVITY: Your care partner should take you home directly after the procedure.  You should plan to take it easy, moving slowly for the rest of the day.  You can resume normal activity the day after the procedure however you should NOT DRIVE or use heavy machinery for 24 hours (because of the sedation medicines used during the test).     SYMPTOMS TO REPORT IMMEDIATELY: A gastroenterologist can be reached at any hour.  During normal business hours, 8:30 AM to 5:00 PM Monday through Friday, call [phone].  After hours and on weekends, please call the GI answering service at [phone] who will take a message and have the physician on call contact you.   Following lower endoscopy (colonoscopy or flexible sigmoidoscopy):  Excessive amounts of blood in the stool  Significant tenderness or worsening of abdominal pains  Swelling of the abdomen that is new, acute  Fever of 100F or higher  Following upper endoscopy (EGD)  Vomiting of blood or coffee ground material  New chest pain or pain under the shoulder blades  Painful or persistently difficult swallowing  New shortness of breath  Fever of 100F or higher  Black, tarry-looking stools  FOLLOW UP: If any biopsies were taken you will be contacted by phone or by letter within the next 1-3 weeks.  Call your gastroenterologist if you have not heard about the biopsies in 3 weeks.  Our staff will call the home number listed on your records the next business day following your procedure to check on you and address any questions or concerns that you may have at that time regarding the information given to you following your procedure. This is a courtesy call and so if there is no answer at the home number and we have not heard  from you through the emergency physician on call, we will assume that you have returned to your regular daily activities without incident.  SIGNATURES/CONFIDENTIALITY: You and/or your care partner have signed paperwork which will be entered into your electronic medical record.  These signatures attest to the fact that that the information above on your After Visit Summary has been reviewed and is understood.  Full responsibility of the confidentiality of this discharge information lies with you and/or your care-partner.

## 2014-11-18 ENCOUNTER — Telehealth: Payer: Self-pay | Admitting: *Deleted

## 2014-11-18 NOTE — Telephone Encounter (Signed)
  Follow up Call-  Call back number 11/15/2014  Post procedure Call Back phone  # [phone]  Permission to leave phone message Yes     Patient questions:  Do you have a fever, pain , or abdominal swelling? No. Pain Score  0 *  Have you tolerated food without any problems? Yes.    Have you been able to return to your normal activities? Yes.    Do you have any questions about your discharge instructions: Diet   No. Medications  No. Follow up visit  No.  Do you have questions or concerns about your Care? No.  Actions: * If pain score is 4 or above: No action needed, pain <4. Patient stating she feels like she has a UTI, denies abdominal swelling or abdominal pain related to procedure. Patient complains of burning and frequency of urination. Encouraged the patient to call her PCP today. Patient stating she is planning to do this today.

## 2014-11-20 ENCOUNTER — Encounter: Payer: Self-pay | Admitting: Internal Medicine

## 2014-12-16 ENCOUNTER — Encounter: Payer: Self-pay | Admitting: Internal Medicine

## 2014-12-16 ENCOUNTER — Ambulatory Visit (INDEPENDENT_AMBULATORY_CARE_PROVIDER_SITE_OTHER): Payer: Medicare Other | Admitting: Internal Medicine

## 2014-12-16 VITALS — BP 118/74 | HR 58 | Temp 97.7°F | Resp 16 | Ht 59.0 in | Wt 143.5 lb

## 2014-12-16 DIAGNOSIS — M179 Osteoarthritis of knee, unspecified: Secondary | ICD-10-CM | POA: Diagnosis not present

## 2014-12-16 DIAGNOSIS — E038 Other specified hypothyroidism: Secondary | ICD-10-CM | POA: Diagnosis not present

## 2014-12-16 DIAGNOSIS — M1712 Unilateral primary osteoarthritis, left knee: Secondary | ICD-10-CM

## 2014-12-16 DIAGNOSIS — I10 Essential (primary) hypertension: Secondary | ICD-10-CM

## 2014-12-16 DIAGNOSIS — M25562 Pain in left knee: Secondary | ICD-10-CM

## 2014-12-16 DIAGNOSIS — E034 Atrophy of thyroid (acquired): Secondary | ICD-10-CM

## 2014-12-16 MED ORDER — HYDROCHLOROTHIAZIDE 25 MG PO TABS: 25.0000 mg | ORAL_TABLET | Freq: Every day | ORAL | Status: DC

## 2014-12-16 MED ORDER — LEVOTHYROXINE SODIUM 88 MCG PO TABS: 88.0000 ug | ORAL_TABLET | Freq: Every day | ORAL | Status: DC

## 2014-12-16 NOTE — Patient Instructions (Addendum)
We will refill your thyroid  medication at the current dose and repeat your thyroid level in 6 moths   You can continue using tylenol up to 2000 mg daily for knee pain,  and  You can add Aleve twice daily  Or meloxicam once daily instead of aleve (whichever NSAID works better for you)     Referral to Dr Ricki Rodriguez at [facility] do not have a heart murmur today

## 2014-12-16 NOTE — Progress Notes (Addendum)
Patient ID: Christine Manning, female   DOB: [birth year], 69 y.o.   MRN: [id number]  Patient Active Problem List   Diagnosis Date Noted  . Left knee pain 12/17/2014  . History of bladder suspension procedure 09/08/2014  . SI (stress incontinence), female 09/08/2014  . Flatulence 06/18/2014  . Insomnia 06/18/2014  . Chronic pain in right shoulder 05/14/2014  . Osteopenia 11/22/2013  . Unspecified vitamin D deficiency 08/28/2013  . Overweight (BMI 25.0-29.9) 08/14/2013  . Chest pain, unspecified 08/14/2013  . S/P hysterectomy with oophorectomy 08/13/2013  . History of thyroid nodule 08/13/2013  . Encounter for Medicare annual wellness exam 08/13/2013  . Osteoarthritis of left knee 07/16/2013  . Degenerative disk disease 07/16/2013  . Chronic suprapubic pain 07/07/2013  . Back pain with radiation 07/05/2013  . Personal history of DVT (deep vein thrombosis)   . Anxiety 10/11/2012  . Irritable bowel syndrome 10/11/2012  . 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 Hyperlipidemia   . Hypertension   . Asthma   . Hypothyroidism     Subjective:  CC:   Chief Complaint  Patient presents with  . Hypothyroidism    HPI:   Christine Manning is a 69 y.o. female who presents for   Follow up on hypothyroidism, hypertension, IBS with recent colonoscopy and worsening knee pain.  She was found to have a tubular adenoma   By colonoscopy feb 2016,  Follow up 5 yrs recommended.  IBS has been quiescent.   Her left knee has been  locking up while in bed.  Walking daily on treadmill  Has pain every day both on the medial and lateral sides  and more recently the pain has been radiating to the shin .no prior ortho evaluation but had a cortisone shot in the past and arthroscopy  done years ago at [facility], she thinks  in 1998 with temporary improvement in pain.  There is no history of fall .  Last x rays were years ago  And was told she would need a joint replacement    Past Medical History  Diagnosis Date   . Hyperlipidemia   . Hypertension   . Asthma   . Personal history of DVT (deep vein thrombosis) 1980    x 2, pregnanacy and immobility induced   . DDD (degenerative disc disease)   . Hyperthyroidism   . Colon polyp     Past Surgical History  Procedure Laterality Date  . Bladder suspension  2003  . Abdominal hysterectomy  1991    secondary to abnormal PAPs  . Coronary angioplasty  2013    arteries were clean .  Dr. Rona Ravens at [facility]   . Knee arthroscopy         The following portions of the patient's history were reviewed and updated as appropriate: Allergies, current medications, and problem list.    Review of Systems:   Patient denies headache, fevers, malaise, unintentional weight loss, skin rash, eye pain, sinus congestion and sinus pain, sore throat, dysphagia,  hemoptysis , cough, dyspnea, wheezing, chest pain, palpitations, orthopnea, edema, abdominal pain, nausea, melena, diarrhea, constipation, flank pain, dysuria, hematuria, urinary  Frequency, nocturia, numbness, tingling, seizures,  Focal weakness, Loss of consciousness,  Tremor, insomnia, depression, anxiety, and suicidal ideation.     History   Social History  . Marital Status: Divorced    Spouse Name: N/A  . Number of Children: 3  . Years of Education: N/A   Occupational History  . Retired    Social History Main  Topics  . Smoking status: Never Smoker   . Smokeless tobacco: Never Used  . Alcohol Use: No  . Drug Use: No  . Sexual Activity: Not on file   Other Topics Concern  . 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     Objective:  Filed Vitals:   12/16/14 1018  BP: 118/74  Pulse: 58  Temp: 97.7 F (36.5 C)  Resp: 16     General appearance: alert, cooperative and appears stated age Ears: normal TM's and external ear canals both ears Throat: lips, mucosa, and tongue normal; teeth and gums normal Neck: no adenopathy, no carotid bruit,  supple, symmetrical, trachea midline and thyroid not enlarged, symmetric, no tenderness/mass/nodules Back: symmetric, no curvature. ROM normal. No CVA tenderness. Lungs: clear to auscultation bilaterally Heart: regular rate and rhythm, S1, S2 normal, no murmur, click, rub or gallop Abdomen: soft, non-tender; bowel sounds normal; no masses,  no organomegaly Pulses: 2+ and symmetric Skin: Skin color, texture, turgor normal. No rashes or lesions Lymph nodes: Cervical, supraclavicular, and axillary nodes normal.  Assessment and Plan:  Hypertension Well controlled on current regimen. Renal function has been stable, no changes today.  Lab Results  Component Value Date   CREATININE 1.0 06/17/2014      Hypothyroidism Thyroid function is WNL on current dose.  No current changes needed.   Lab Results  Component Value Date   TSH 0.451 09/06/2014      Osteoarthritis of left knee With progressive pain now involving  The shin, secondary to DJD knee.  referral to [facility] [place].    A total of 25 minutes of face to face time was spent with patient more than half of which was spent in counselling about the above mentioned conditions  and coordination of care   Updated Medication List Outpatient Encounter Prescriptions as of 12/16/2014  Medication Sig  . albuterol (PROVENTIL HFA;VENTOLIN HFA) 108 (90 BASE) MCG/ACT inhaler Inhale 2 puffs into the lungs every 6 (six) hours as needed for wheezing or shortness of breath.  Marland Kitchen albuterol (PROVENTIL) (2.5 MG/3ML) 0.083% nebulizer solution Take 3 mLs (2.5 mg total) by nebulization every 6 (six) hours as needed for wheezing or shortness of breath.  Marland Kitchen aspirin 81 MG tablet Take 81 mg by mouth daily.  Marland Kitchen dicyclomine (BENTYL) 20 MG tablet Take 1 tablet (20 mg total) by mouth every morning.  . ergocalciferol (DRISDOL) 50000 UNITS capsule Take 1 capsule (50,000 Units total) by mouth once a week.  . Fluticasone-Salmeterol (ADVAIR) 250-50  MCG/DOSE AEPB Inhale 1 puff into the lungs every 12 (twelve) hours.  . gabapentin (NEURONTIN) 100 MG capsule Take 1 capsule (100 mg total) by mouth 3 (three) times daily.  . hydrochlorothiazide (HYDRODIURIL) 25 MG tablet Take 1 tablet (25 mg total) by mouth daily.  . hydrOXYzine (ATARAX/VISTARIL) 25 MG tablet Take 25 mg by mouth 3 (three) times daily as needed.  . hyoscyamine (LEVSIN SL) 0.125 MG SL tablet Place 1 tablet (0.125 mg total) under the tongue every 4 (four) hours as needed for cramping.  . ibandronate (BONIVA) 150 MG tablet Take 1 tablet (150 mg total) by mouth every 30 (thirty) days. Take in the morning with a full glass of water, on an empty stomach, and do not take anything else by mouth or lie down for the next 30 min.  . Lactobacillus-Inulin ([place]) CAPS Take 1 capsule by mouth daily.  Marland Kitchen levothyroxine (  SYNTHROID, LEVOTHROID) 88 MCG tablet Take 1 tablet (88 mcg total) by mouth daily.  Marland Kitchen lisinopril (PRINIVIL,ZESTRIL) 20 MG tablet Take 1 tablet (20 mg total) by mouth daily.  . meclizine (ANTIVERT) 25 MG tablet Take 1 tablet (25 mg total) by mouth 3 (three) times daily as needed for dizziness.  . meloxicam (MOBIC) 15 MG tablet Take 1 tablet (15 mg total) by mouth daily.  . metoprolol tartrate (LOPRESSOR) 25 MG tablet Take 1 tablet (25 mg total) by mouth 2 (two) times daily.  . mometasone (NASONEX) 50 MCG/ACT nasal spray Place 2 sprays into the nose daily.  . Multiple Vitamin (MULTIVITAMIN) tablet Take 1 tablet by mouth daily.  . ondansetron (ZOFRAN) 4 MG tablet Take 1 tablet (4 mg total) by mouth every 8 (eight) hours as needed for nausea or vomiting.  . pantoprazole (PROTONIX) 40 MG tablet Take 40 mg by mouth daily.  . simvastatin (ZOCOR) 40 MG tablet Take 1 tablet (40 mg total) by mouth every evening.  . zoster vaccine live, PF, (ZOSTAVAX) 19379 UNT/0.65ML injection Inject 19,400 Units into the skin once.  . [DISCONTINUED] hydrochlorothiazide (HYDRODIURIL) 25 MG  tablet TAKE ONE TABLET BY MOUTH ONCE DAILY  . [DISCONTINUED] levothyroxine (SYNTHROID, LEVOTHROID) 88 MCG tablet TAKE ONE TABLET BY MOUTH ONCE DAILY     No orders of the defined types were placed in this encounter.    No Follow-up on file.

## 2014-12-16 NOTE — Progress Notes (Signed)
Pre-visit discussion using our clinic review tool. No additional management support is needed unless otherwise documented below in the visit note.  

## 2014-12-17 ENCOUNTER — Encounter: Payer: Self-pay | Admitting: Internal Medicine

## 2014-12-17 DIAGNOSIS — M25562 Pain in left knee: Secondary | ICD-10-CM | POA: Insufficient documentation

## 2014-12-17 NOTE — Assessment & Plan Note (Signed)
Well controlled on current regimen. Renal function has been stable, no changes today.  Lab Results  Component Value Date   CREATININE 1.0 06/17/2014

## 2014-12-17 NOTE — Assessment & Plan Note (Signed)
With progressive pain now involving  The shin, secondary to DJD knee.  referral to [facility].

## 2014-12-17 NOTE — Assessment & Plan Note (Signed)
Thyroid function is WNL on current dose.  No current changes needed.   Lab Results  Component Value Date   TSH 0.451 09/06/2014

## 2014-12-26 ENCOUNTER — Telehealth: Payer: Self-pay | Admitting: Internal Medicine

## 2014-12-26 ENCOUNTER — Emergency Department
Admit: 2014-12-26 | Disposition: A | Discharge status: Home / Self Care | Payer: Self-pay | Admitting: Emergency Medicine

## 2014-12-26 LAB — URINALYSIS, COMPLETE
BACTERIA: NONE SEEN
BILIRUBIN, UR: NEGATIVE
BLOOD: NEGATIVE
GLUCOSE, UR: NEGATIVE mg/dL (ref 0–75)
Ketone: NEGATIVE
Nitrite: NEGATIVE
Ph: 7 (ref 4.5–8.0)
Protein: 30
RBC,UR: NONE SEEN /HPF (ref 0–5)
SPECIFIC GRAVITY: 1.021 (ref 1.003–1.030)

## 2014-12-26 NOTE — Telephone Encounter (Signed)
FYI

## 2014-12-26 NOTE — Telephone Encounter (Signed)
[facility]  Patient Name: Christine Manning  DOB: Aug 26, 1946    Initial Comment Caller states she has UTI, unable to get appt, wants to know what to do in the meantime   Nurse Assessment  Nurse: Harlow Mares, RN, Suanne Marker Date/Time [name] Time): 12/26/2014 3:12:44 PM  Confirm and document reason for call. If symptomatic, describe symptoms. ---Caller states she has UTI, unable to get appt, wants to know what to do in the meantime. Reports that the earliest appt is on Monday with the PA. She is uncomfortable now and is drinking cranberry juice, and has been having chills and frequency. She is only able to dribble. Reports burning with urination. Denies fever. She has ureter sticture and needs dilation on occasion.  Has the patient traveled out of the country within the last 30 days? ---Not Applicable  Does the patient require triage? ---Yes  Related visit to physician within the last 2 weeks? ---Yes   Couple of weeks ago, caller reports a 6 mo follow up with MD.  Does the PT have any chronic conditions? (i.e. diabetes, asthma, etc.) ---Yes  List chronic conditions. ---hypertension; asthma; hyperthyroidism     Guidelines    Guideline Title Affirmed Question Affirmed Notes  Urination Pain - Female [1] Unable to urinate (or only a few drops) > 4 hours AND [2] bladder feels very full (e.g., palpable bladder or strong urge to urinate)    Final Disposition User   Go to ED Now Harlow Mares, RN, Suanne Marker

## 2014-12-30 ENCOUNTER — Ambulatory Visit (INDEPENDENT_AMBULATORY_CARE_PROVIDER_SITE_OTHER): Payer: Medicare Other | Admitting: Nurse Practitioner

## 2014-12-30 ENCOUNTER — Encounter: Payer: Self-pay | Admitting: Nurse Practitioner

## 2014-12-30 DIAGNOSIS — R1010 Upper abdominal pain, unspecified: Secondary | ICD-10-CM | POA: Diagnosis not present

## 2014-12-30 DIAGNOSIS — N390 Urinary tract infection, site not specified: Secondary | ICD-10-CM

## 2014-12-30 DIAGNOSIS — G8929 Other chronic pain: Secondary | ICD-10-CM | POA: Diagnosis not present

## 2014-12-30 DIAGNOSIS — R109 Unspecified abdominal pain: Secondary | ICD-10-CM

## 2014-12-30 DIAGNOSIS — R1011 Right upper quadrant pain: Principal | ICD-10-CM

## 2014-12-30 LAB — POCT URINALYSIS DIPSTICK
Blood, UA: NEGATIVE
Glucose, UA: 250
Ketones, UA: 15
NITRITE UA: POSITIVE
PH UA: 5
Spec Grav, UA: 1.015

## 2014-12-30 MED ORDER — ONDANSETRON HCL 4 MG PO TABS: 4.0000 mg | ORAL_TABLET | Freq: Three times a day (TID) | ORAL | Status: DC | PRN

## 2014-12-30 MED ORDER — ERGOCALCIFEROL 1.25 MG (50000 UT) PO CAPS: 50000.0000 [IU] | ORAL_CAPSULE | ORAL | Status: DC

## 2014-12-30 NOTE — Progress Notes (Signed)
Pre visit review using our clinic review tool, if applicable. No additional management support is needed unless otherwise documented below in the visit note. 

## 2014-12-30 NOTE — Progress Notes (Signed)
Subjective:    Patient ID: Christine Manning, female    DOB: 09/14/1946, 69 y.o.   MRN: [id number]  HPI  Christine Manning is a 69 yo female with a CC of ER f/u from 4/7.   1)  Vomiting yesterday after eating chicken salad, Vomiting since Thursday, more than 4 x a day, Thursday up all night, Friday up all night. Will finish Cipro tomorrow.  Has gallbladder  Chicken salad and black berries today.   Started with back pain and frequency   Review of Systems  Constitutional: Negative for fever, chills, diaphoresis and fatigue.  Respiratory: Negative for chest tightness, shortness of breath and wheezing.   Cardiovascular: Negative for chest pain, palpitations and leg swelling.  Gastrointestinal: Positive for nausea and vomiting. Negative for diarrhea.  Skin: Negative for rash.  Neurological: Negative for dizziness, weakness, numbness and headaches.  Psychiatric/Behavioral: The patient is not nervous/anxious.    Past Medical History  Diagnosis Date  . Hyperlipidemia   . Hypertension   . Asthma   . Personal history of DVT (deep vein thrombosis) 1980    x 2, pregnanacy and immobility induced   . DDD (degenerative disc disease)   . Hyperthyroidism   . Colon polyp     History   Social History  . Marital Status: Divorced    Spouse Name: N/A  . Number of Children: 3  . Years of Education: N/A   Occupational History  . Retired    Social History Main Topics  . Smoking status: Never Smoker   . Smokeless tobacco: Never Used  . Alcohol Use: No  . Drug Use: No  . Sexual Activity: Not on file   Other Topics Concern  . 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     Past Surgical History  Procedure Laterality Date  . Bladder suspension  2003  . Abdominal hysterectomy  1991    secondary to abnormal PAPs  . Coronary angioplasty  2013    arteries were clean .  Dr. Rona Ravens at [facility]   . Knee arthroscopy      Family History  Problem  Relation Age of Onset  . Mental illness Mother     multi infarct dementia  . Stroke Mother   . Asthma Mother   . Mental illness Father     alzheimers dementia  . Asthma Brother   . Asthma Son   . Colon cancer Maternal Aunt 70  . Uterine cancer Maternal Grandmother   . Colon polyps Sister   . Diabetes Neg Hx   . Kidney disease Neg Hx   . Esophageal cancer Neg Hx   . Asthma Daughter   . Colon cancer Cousin     40's    Allergies  Allergen Reactions  . Codeine Hives and Shortness Of Breath  . Latex Hives and Swelling  . Augmentin [Amoxicillin-Pot Clavulanate] Other (See Comments)    Causes IBS Causes IBS  . Morphine Nausea Only  . Morphine And Related Nausea And Vomiting    Current Outpatient Prescriptions on File Prior to Visit  Medication Sig Dispense Refill  . albuterol (PROVENTIL HFA;VENTOLIN HFA) 108 (90 BASE) MCG/ACT inhaler Inhale 2 puffs into the lungs every 6 (six) hours as needed for wheezing or shortness of breath. 6.7 g 1  . albuterol (PROVENTIL) (2.5 MG/3ML) 0.083% nebulizer solution Take 3 mLs (2.5 mg total) by nebulization every 6 (six) hours as needed for wheezing  or shortness of breath. 150 mL 1  . aspirin 81 MG tablet Take 81 mg by mouth daily.    Marland Kitchen dicyclomine (BENTYL) 20 MG tablet Take 1 tablet (20 mg total) by mouth every morning. 30 tablet 1  . Fluticasone-Salmeterol (ADVAIR) 250-50 MCG/DOSE AEPB Inhale 1 puff into the lungs every 12 (twelve) hours. 60 each 6  . gabapentin (NEURONTIN) 100 MG capsule Take 1 capsule (100 mg total) by mouth 3 (three) times daily. 30 capsule 3  . hydrochlorothiazide (HYDRODIURIL) 25 MG tablet Take 1 tablet (25 mg total) by mouth daily. 90 tablet 0  . hydrOXYzine (ATARAX/VISTARIL) 25 MG tablet Take 25 mg by mouth 3 (three) times daily as needed.    . hyoscyamine (LEVSIN SL) 0.125 MG SL tablet Place 1 tablet (0.125 mg total) under the tongue every 4 (four) hours as needed for cramping. 30 tablet 3  . ibandronate (BONIVA) 150 MG  tablet Take 1 tablet (150 mg total) by mouth every 30 (thirty) days. Take in the morning with a full glass of water, on an empty stomach, and do not take anything else by mouth or lie down for the next 30 min. 1 tablet 11  . Lactobacillus-Inulin ([place]) CAPS Take 1 capsule by mouth daily. 14 capsule 0  . levothyroxine (SYNTHROID, LEVOTHROID) 88 MCG tablet Take 1 tablet (88 mcg total) by mouth daily. 90 tablet 0  . lisinopril (PRINIVIL,ZESTRIL) 20 MG tablet Take 1 tablet (20 mg total) by mouth daily. 90 tablet 1  . meclizine (ANTIVERT) 25 MG tablet Take 1 tablet (25 mg total) by mouth 3 (three) times daily as needed for dizziness. 30 tablet 0  . meloxicam (MOBIC) 15 MG tablet Take 1 tablet (15 mg total) by mouth daily. 30 tablet 2  . metoprolol tartrate (LOPRESSOR) 25 MG tablet Take 1 tablet (25 mg total) by mouth 2 (two) times daily. 180 tablet 1  . mometasone (NASONEX) 50 MCG/ACT nasal spray Place 2 sprays into the nose daily.    . Multiple Vitamin (MULTIVITAMIN) tablet Take 1 tablet by mouth daily.    . pantoprazole (PROTONIX) 40 MG tablet Take 40 mg by mouth daily.    . simvastatin (ZOCOR) 40 MG tablet Take 1 tablet (40 mg total) by mouth every evening. 90 tablet 1  . zoster vaccine live, PF, (ZOSTAVAX) 40981 UNT/0.65ML injection Inject 19,400 Units into the skin once. 1 each 0   No current facility-administered medications on file prior to visit.       Objective:   Physical Exam  Constitutional: She is oriented to person, place, and time. She appears well-developed and well-nourished. No distress.  BP 116/70 mmHg  Pulse 69  Temp(Src) 98.1 F (36.7 C) (Oral)  Resp 12  Ht 4\' 11"  (1.499 m)  Wt 142 lb 12.8 oz (64.774 kg)  BMI 28.83 kg/m2  SpO2 96%   HENT:  Head: Normocephalic and atraumatic.  Right Ear: External ear normal.  Left Ear: External ear normal.  Eyes: Right eye exhibits no discharge. Left eye exhibits no discharge. No scleral icterus.    Cardiovascular: Normal rate, regular rhythm, normal heart sounds and intact distal pulses.  Exam reveals no gallop and no friction rub.   No murmur heard. Pulmonary/Chest: Effort normal and breath sounds normal. No respiratory distress. She has no wheezes. She has no rales. She exhibits no tenderness.  Abdominal: Soft. Bowel sounds are normal. She exhibits no distension and no mass. There is tenderness. There is no rebound and no guarding.  Epigastric tenderness and RUQ tenderness  Neurological: She is alert and oriented to person, place, and time. No cranial nerve deficit. She exhibits normal muscle tone. Coordination normal.  Skin: Skin is warm and dry. No rash noted. She is not diaphoretic.  Psychiatric: She has a normal mood and affect. Her behavior is normal. Judgment and thought content normal.      Assessment & Plan:

## 2014-12-30 NOTE — Patient Instructions (Signed)
We will contact you about your results.   Eat lightly with water and ginger ale for now.

## 2014-12-31 LAB — CBC WITH DIFFERENTIAL/PLATELET
Basophils Absolute: 0 10*3/uL (ref 0.0–0.1)
Basophils Relative: 0.6 % (ref 0.0–3.0)
EOS PCT: 1.7 % (ref 0.0–5.0)
Eosinophils Absolute: 0.1 10*3/uL (ref 0.0–0.7)
HCT: 36.7 % (ref 36.0–46.0)
Hemoglobin: 12.4 g/dL (ref 12.0–15.0)
LYMPHS PCT: 45.9 % (ref 12.0–46.0)
Lymphs Abs: 2.8 10*3/uL (ref 0.7–4.0)
MCHC: 33.9 g/dL (ref 30.0–36.0)
MCV: 92.5 fl (ref 78.0–100.0)
MONOS PCT: 5.8 % (ref 3.0–12.0)
Monocytes Absolute: 0.4 10*3/uL (ref 0.1–1.0)
NEUTROS PCT: 46 % (ref 43.0–77.0)
Neutro Abs: 2.8 10*3/uL (ref 1.4–7.7)
Platelets: 375 10*3/uL (ref 150.0–400.0)
RBC: 3.96 Mil/uL (ref 3.87–5.11)
RDW: 14.9 % (ref 11.5–15.5)
WBC: 6.2 10*3/uL (ref 4.0–10.5)

## 2014-12-31 LAB — BASIC METABOLIC PANEL
BUN: 15 mg/dL (ref 6–23)
CO2: 28 meq/L (ref 19–32)
CREATININE: 1.25 mg/dL — AB (ref 0.40–1.20)
Calcium: 9.4 mg/dL (ref 8.4–10.5)
Chloride: 102 mEq/L (ref 96–112)
GFR: 54.92 mL/min — ABNORMAL LOW (ref >60.00)
Glucose, Bld: 77 mg/dL (ref 70–99)
Potassium: 3.7 mEq/L (ref 3.5–5.1)
SODIUM: 139 meq/L (ref 135–145)

## 2014-12-31 LAB — HEPATIC FUNCTION PANEL
ALK PHOS: 57 U/L (ref 39–117)
ALT: 12 U/L (ref 0–35)
AST: 20 U/L (ref 0–37)
Albumin: 4.2 g/dL (ref 3.5–5.2)
BILIRUBIN TOTAL: 0.4 mg/dL (ref 0.2–1.2)
Bilirubin, Direct: 0.1 mg/dL (ref 0.0–0.3)
Total Protein: 7.5 g/dL (ref 6.0–8.3)

## 2014-12-31 LAB — AMYLASE: AMYLASE: 57 U/L (ref 27–131)

## 2014-12-31 LAB — LIPASE: Lipase: 39 U/L (ref 11.0–59.0)

## 2015-01-01 LAB — URINE CULTURE
COLONY COUNT: NO GROWTH
ORGANISM ID, BACTERIA: NO GROWTH

## 2015-01-03 ENCOUNTER — Other Ambulatory Visit: Payer: Self-pay | Admitting: Nurse Practitioner

## 2015-01-03 DIAGNOSIS — R110 Nausea: Secondary | ICD-10-CM

## 2015-01-04 DIAGNOSIS — R1011 Right upper quadrant pain: Principal | ICD-10-CM

## 2015-01-04 DIAGNOSIS — G8929 Other chronic pain: Secondary | ICD-10-CM | POA: Insufficient documentation

## 2015-01-04 NOTE — Assessment & Plan Note (Signed)
Will culture urine, POCT urine shows a lot of things pointing to possible gallbladder/gastro issues. Obtain Amylase/Lipase, HFP, CBC w/ diff and BMET.

## 2015-01-16 ENCOUNTER — Telehealth: Payer: Self-pay | Admitting: Nurse Practitioner

## 2015-01-16 ENCOUNTER — Other Ambulatory Visit: Payer: Self-pay | Admitting: Nurse Practitioner

## 2015-01-16 DIAGNOSIS — M1712 Unilateral primary osteoarthritis, left knee: Secondary | ICD-10-CM

## 2015-01-16 NOTE — Telephone Encounter (Signed)
Referral placed  Thanks!

## 2015-01-16 NOTE — Telephone Encounter (Signed)
Knees bother her everyday she does not have any cartilage in her knees . She is requesting a referral to [facility].

## 2015-02-10 ENCOUNTER — Other Ambulatory Visit: Payer: Self-pay | Admitting: Gastroenterology

## 2015-02-10 DIAGNOSIS — R1011 Right upper quadrant pain: Secondary | ICD-10-CM

## 2015-02-24 ENCOUNTER — Ambulatory Visit
Admission: RE | Admit: 2015-02-24 | Discharge: 2015-02-24 | Disposition: A | Discharge status: Home / Self Care | Payer: Medicare Other | Source: Ambulatory Visit | Attending: Internal Medicine | Admitting: Internal Medicine

## 2015-02-24 DIAGNOSIS — R1011 Right upper quadrant pain: Secondary | ICD-10-CM | POA: Insufficient documentation

## 2015-02-24 DIAGNOSIS — Z885 Allergy status to narcotic agent status: Secondary | ICD-10-CM | POA: Insufficient documentation

## 2015-02-24 DIAGNOSIS — R1013 Epigastric pain: Secondary | ICD-10-CM | POA: Diagnosis present

## 2015-02-24 DIAGNOSIS — R110 Nausea: Secondary | ICD-10-CM | POA: Diagnosis present

## 2015-05-22 ENCOUNTER — Other Ambulatory Visit: Payer: Self-pay | Admitting: Internal Medicine

## 2015-05-22 ENCOUNTER — Other Ambulatory Visit: Payer: Self-pay | Admitting: Nurse Practitioner

## 2015-05-23 ENCOUNTER — Other Ambulatory Visit: Payer: Self-pay | Admitting: Surgical

## 2015-05-23 NOTE — Telephone Encounter (Signed)
error 

## 2015-06-09 ENCOUNTER — Other Ambulatory Visit: Payer: Self-pay

## 2015-06-09 ENCOUNTER — Telehealth: Payer: Self-pay

## 2015-06-09 NOTE — Telephone Encounter (Signed)
Pt said that ventolin with the red/white does not work, but the one in the blue top is the one that works best for her? The insurance does not cover the ventolin inhaler and she will be calling to see why they have started not covering it.

## 2015-06-09 NOTE — Telephone Encounter (Signed)
Call patient and ask her why she needs a refill after only 19 days

## 2015-06-09 NOTE — Telephone Encounter (Signed)
Pharmacy sent refill request for ventolin HFA inhaler. This medication was ordered 05/22/15. Please advise? Since it is so early for a refill.

## 2015-06-09 NOTE — Telephone Encounter (Signed)
error 

## 2015-06-09 NOTE — Telephone Encounter (Signed)
I cannot tell which color top is which when I order it. i may end up ordering the same one./  Call her back and find out exactly what the blut top one says on it

## 2015-06-10 ENCOUNTER — Telehealth: Payer: Self-pay | Admitting: *Deleted

## 2015-06-10 ENCOUNTER — Other Ambulatory Visit: Payer: Self-pay | Admitting: *Deleted

## 2015-06-10 MED ORDER — ALBUTEROL SULFATE HFA 108 (90 BASE) MCG/ACT IN AERS: 2.0000 | INHALATION_SPRAY | Freq: Four times a day (QID) | RESPIRATORY_TRACT | Status: DC | PRN

## 2015-06-10 NOTE — Progress Notes (Signed)
Refill sent for Proventil as patient requested.

## 2015-06-10 NOTE — Telephone Encounter (Signed)
Patient has requested a Rx Refill for her inhaler Provental albuterol,patient stated this inhaler giver her relief-thanks  FYI patient requested a call back on tomorrow, due to phone issues.

## 2015-06-10 NOTE — Telephone Encounter (Signed)
Left message for patient to call office.  

## 2015-06-16 ENCOUNTER — Telehealth: Payer: Self-pay | Admitting: *Deleted

## 2015-06-16 ENCOUNTER — Other Ambulatory Visit: Payer: Self-pay

## 2015-06-16 ENCOUNTER — Other Ambulatory Visit (INDEPENDENT_AMBULATORY_CARE_PROVIDER_SITE_OTHER): Payer: Medicare Other

## 2015-06-16 ENCOUNTER — Telehealth: Payer: Self-pay

## 2015-06-16 DIAGNOSIS — E785 Hyperlipidemia, unspecified: Secondary | ICD-10-CM | POA: Diagnosis not present

## 2015-06-16 DIAGNOSIS — E038 Other specified hypothyroidism: Secondary | ICD-10-CM | POA: Diagnosis not present

## 2015-06-16 DIAGNOSIS — E034 Atrophy of thyroid (acquired): Secondary | ICD-10-CM

## 2015-06-16 DIAGNOSIS — R110 Nausea: Secondary | ICD-10-CM

## 2015-06-16 DIAGNOSIS — R944 Abnormal results of kidney function studies: Secondary | ICD-10-CM

## 2015-06-16 LAB — COMPREHENSIVE METABOLIC PANEL
ALBUMIN: 4.3 g/dL (ref 3.5–5.2)
ALT: 10 U/L (ref 0–35)
AST: 18 U/L (ref 0–37)
Alkaline Phosphatase: 54 U/L (ref 39–117)
BILIRUBIN TOTAL: 0.4 mg/dL (ref 0.2–1.2)
BUN: 24 mg/dL — AB (ref 6–23)
CALCIUM: 9.4 mg/dL (ref 8.4–10.5)
CO2: 29 meq/L (ref 19–32)
CREATININE: 1.21 mg/dL — AB (ref 0.40–1.20)
Chloride: 103 mEq/L (ref 96–112)
GFR: 56.95 mL/min — ABNORMAL LOW (ref >60.00)
Glucose, Bld: 96 mg/dL (ref 70–99)
Potassium: 4.3 mEq/L (ref 3.5–5.1)
SODIUM: 140 meq/L (ref 135–145)
Total Protein: 7.1 g/dL (ref 6.0–8.3)

## 2015-06-16 LAB — LIPID PANEL
Cholesterol: 227 mg/dL — ABNORMAL HIGH (ref 0–200)
HDL: 49.3 mg/dL (ref >39.00)
LDL CALC: 151 mg/dL — AB (ref 0–99)
NonHDL: 177.37
Total CHOL/HDL Ratio: 5
Triglycerides: 132 mg/dL (ref 0.0–149.0)
VLDL: 26.4 mg/dL (ref 0.0–40.0)

## 2015-06-16 LAB — TSH: TSH: 3.19 u[IU]/mL (ref 0.35–4.50)

## 2015-06-16 MED ORDER — ALBUTEROL SULFATE HFA 108 (90 BASE) MCG/ACT IN AERS: 2.0000 | INHALATION_SPRAY | Freq: Four times a day (QID) | RESPIRATORY_TRACT | Status: DC | PRN

## 2015-06-16 NOTE — Addendum Note (Signed)
Addended by: Crecencio Mc on: 06/16/2015 12:34 PM   Modules accepted: Orders

## 2015-06-16 NOTE — Telephone Encounter (Signed)
Labs and dx?  

## 2015-06-16 NOTE — Telephone Encounter (Signed)
Pharmacy faxed request for ProAir inhaler.  Please advise? And give specifics.  Thanks

## 2015-06-16 NOTE — Telephone Encounter (Signed)
Ok to refill,  Refill sent  

## 2015-06-17 ENCOUNTER — Other Ambulatory Visit: Payer: Self-pay

## 2015-06-17 NOTE — Telephone Encounter (Signed)
Medication was refilled.

## 2015-06-18 NOTE — Addendum Note (Signed)
Addended by: Crecencio Mc on: 06/18/2015 01:18 PM   Modules accepted: Orders

## 2015-06-19 NOTE — Progress Notes (Signed)
Notified patient of labs and scheduled repeat BMET.

## 2015-06-23 ENCOUNTER — Other Ambulatory Visit (INDEPENDENT_AMBULATORY_CARE_PROVIDER_SITE_OTHER): Payer: Medicare Other

## 2015-06-23 DIAGNOSIS — R944 Abnormal results of kidney function studies: Secondary | ICD-10-CM | POA: Diagnosis not present

## 2015-06-23 LAB — BASIC METABOLIC PANEL
BUN: 24 mg/dL — ABNORMAL HIGH (ref 6–23)
CALCIUM: 9.3 mg/dL (ref 8.4–10.5)
CO2: 27 meq/L (ref 19–32)
Chloride: 103 mEq/L (ref 96–112)
Creatinine, Ser: 1.24 mg/dL — ABNORMAL HIGH (ref 0.40–1.20)
GFR: 55.36 mL/min — ABNORMAL LOW (ref >60.00)
GLUCOSE: 153 mg/dL — AB (ref 70–99)
Potassium: 3.6 mEq/L (ref 3.5–5.1)
Sodium: 140 mEq/L (ref 135–145)

## 2015-06-24 ENCOUNTER — Other Ambulatory Visit: Payer: Self-pay | Admitting: Internal Medicine

## 2015-06-24 DIAGNOSIS — R944 Abnormal results of kidney function studies: Secondary | ICD-10-CM

## 2015-07-03 ENCOUNTER — Ambulatory Visit (INDEPENDENT_AMBULATORY_CARE_PROVIDER_SITE_OTHER): Payer: Medicare Other | Admitting: Family Medicine

## 2015-07-03 ENCOUNTER — Encounter: Payer: Self-pay | Admitting: Family Medicine

## 2015-07-03 VITALS — BP 114/68 | HR 61 | Temp 98.4°F | Ht 59.0 in | Wt 147.0 lb

## 2015-07-03 DIAGNOSIS — T7840XA Allergy, unspecified, initial encounter: Secondary | ICD-10-CM

## 2015-07-03 MED ORDER — LORATADINE 10 MG PO TABS: 10.0000 mg | ORAL_TABLET | Freq: Every day | ORAL | Status: AC

## 2015-07-03 MED ORDER — PREDNISONE 10 MG PO TABS: ORAL_TABLET | ORAL | Status: DC

## 2015-07-03 NOTE — Progress Notes (Signed)
Pre visit review using our clinic review tool, if applicable. No additional management support is needed unless otherwise documented below in the visit note. 

## 2015-07-03 NOTE — Assessment & Plan Note (Signed)
Patient with allergic reaction to recent mosquito bites. Subsequently developed moderate swelling of the upper eyelid. There is no erythema, induration, warmth, or fever, or pain with extraocular movements to indicate a preseptal or orbital cellulitis. She has no tongue or throat swelling with this. She is neurologically intact. Her vision is equal in each eye without correction. She has had no vision changes per her report. She appears well her vitals are stable. Will treat patient with antihistamine and prednisone taper for allergic component. 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 The patient declined this. Discussed that if she were to develop worsening swelling, vision changes, eye pain, headache, numbness, weakness, fever, erythema, warmth, or firmness of the area she should seek medical attention immediately. Also advised to seek medical attention if she has tongue mouth or throat symptoms with this. Given return precautions.

## 2015-07-03 NOTE — Patient Instructions (Signed)
Nice to meet you. We will treat this allergic reaction with antihistamines and prednisone.  If the swelling worsens, becomes red, your vision changes, you have swelling or your throat or tongue, develop fevers, numbness, weakness, or headache please seek medical attention.

## 2015-07-03 NOTE — Progress Notes (Addendum)
Patient ID: Christine Manning, female   DOB: April 04, 1946, 69 y.o.   MRN: [id number]  Tommi Rumps, MD Phone: [phone]  Christine Manning is a 69 y.o. female who presents today for same day visit.  Mosquito bite: Patient reports being bitten by mosquitoes yesterday. Locations above left eye. Notes the area began to swell mildly yesterday. Went to bed last night and then woke up this morning with her upper eyelid swollen. Had mild swelling in her forehead as well. She denies eye pain. She denies change in vision. She does note that the swollen upper eyelid may block some peripheral vision. Though if she lifts her eyelid up she can see normally. She denies fevers. She denies erythema with this. She notes mild dull frontal headache as well with this. She denies numbness or weakness with this. She has a history of tension headaches per her report. Her prior headaches are similar to her current headache. She has not taken any allergy medication for this reaction. She has had a similar reaction to latex in the past where her eyelids and face became swollen. With latex she had throat swelling as well. She states she does not have a EpiPen at this time due to cost concerns. She has had no tongue, mouth, or throat swelling with this. She has had no shortness of breath or chest pain with this. She feels well overall.  PMH: nonsmoker.   ROS see history of present illness   Objective  Physical Exam Filed Vitals:   07/03/15 1121  BP: 114/68  Pulse: 61  Temp: 98.4 F (36.9 C)    Physical Exam  Constitutional: She is well-developed, well-nourished, and in no distress.  HENT:  Left upper eyelid with moderate swelling, there is no erythema, warmth, or tenderness, there is no induration, there is minimal swelling of the left forehead, there is no erythema, warmth or tenderness, there is no induration, left lower lid is not swollen, left eye appears normal, Is able to fully open her eye with no difficulty,  there is no scleral redness, there is no drainage from the eye, right eye and surrounding skin is normal, there is no bony tenderness of the eye sockets, extraocular motion intact with no pain, PERRLA, normal tongue, normal oropharynx  Eyes: Conjunctivae are normal.  Neck: Normal range of motion. Neck supple.  Cardiovascular: Normal rate, regular rhythm and normal heart sounds.  Exam reveals no gallop and no friction rub.   No murmur heard. Pulmonary/Chest: Effort normal and breath sounds normal. No respiratory distress. She has no wheezes. She has no rales.  Lymphadenopathy:    She has no cervical adenopathy.  Neurological: She is alert. Gait normal.  Skin: Skin is warm and dry. She is not diaphoretic.   Left eye 20/70 Right eye 20/70 Bilateral eyes 20/70 Without correction - patient notes she does not have her glasses with her. She denies any vision changes with this issue. Notes her vision is normal with correction.   Assessment/Plan: Please see individual problem list.  Allergic reaction Patient with allergic reaction to recent mosquito bites. Subsequently developed moderate swelling of the upper eyelid. There is no erythema, induration, warmth, or fever, or pain with extraocular movements to indicate a preseptal or orbital cellulitis. She has no tongue or throat swelling with this. She is neurologically intact. Her vision is equal in each eye without correction. She has had no vision changes per her report. She appears well her vitals are stable. Will treat patient with antihistamine  and prednisone taper for allergic component. 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 The patient declined this. Discussed that if she were to develop worsening swelling, vision changes, eye pain, headache, numbness, weakness, fever, erythema, warmth, or firmness of the area she should seek medical attention immediately. Also advised to seek medical attention if she has tongue mouth or throat  symptoms with this. Given return precautions.    Meds ordered this encounter  Medications  . predniSONE (DELTASONE) 10 MG tablet    Sig: Take 60 mg (6 tablets) by mouth on day 1, then take 50 mg (5 tablets) by mouth on day 2, then take 40 mg (4 tablets) by mouth on day 3, then take 30 mg (3 tablets) by mouth on day 4, then take 20 mg (2 tablets) by mouth on 5, then take 10 mg (1 tablet) by mouth on day 6.    Dispense:  21 tablet    Refill:  0  . loratadine (CLARITIN) 10 MG tablet    Sig: Take 1 tablet (10 mg total) by mouth daily.    Dispense:  30 tablet    Refill:  0    Tommi Rumps

## 2015-07-08 ENCOUNTER — Other Ambulatory Visit (INDEPENDENT_AMBULATORY_CARE_PROVIDER_SITE_OTHER): Payer: Medicare Other

## 2015-07-08 ENCOUNTER — Ambulatory Visit (INDEPENDENT_AMBULATORY_CARE_PROVIDER_SITE_OTHER): Payer: Medicare Other

## 2015-07-08 VITALS — BP 100/68 | HR 71 | Resp 18

## 2015-07-08 DIAGNOSIS — R944 Abnormal results of kidney function studies: Secondary | ICD-10-CM

## 2015-07-08 DIAGNOSIS — I10 Essential (primary) hypertension: Secondary | ICD-10-CM | POA: Diagnosis not present

## 2015-07-08 LAB — BASIC METABOLIC PANEL
BUN: 15 mg/dL (ref 6–23)
CO2: 30 meq/L (ref 19–32)
Calcium: 9.6 mg/dL (ref 8.4–10.5)
Chloride: 107 mEq/L (ref 96–112)
Creatinine, Ser: 1.3 mg/dL — ABNORMAL HIGH (ref 0.40–1.20)
GFR: 52.41 mL/min — ABNORMAL LOW (ref >60.00)
GLUCOSE: 94 mg/dL (ref 70–99)
POTASSIUM: 4.3 meq/L (ref 3.5–5.1)
SODIUM: 144 meq/L (ref 135–145)

## 2015-07-08 NOTE — Progress Notes (Addendum)
Per patient she was to return for a BP check as Dr. Derrel Nip had her stop her HCTZ.  She has been feeling great since.  Checked bilateral arms BP and documented in vitals section.  Please advise?   Medications do not need to be changed.  I have reviewed the above information and agree with above.   Deborra Medina, MD

## 2015-07-14 ENCOUNTER — Ambulatory Visit (INDEPENDENT_AMBULATORY_CARE_PROVIDER_SITE_OTHER): Payer: Medicare Other | Admitting: Internal Medicine

## 2015-07-14 ENCOUNTER — Encounter: Payer: Self-pay | Admitting: Internal Medicine

## 2015-07-14 VITALS — BP 124/76 | HR 59 | Temp 98.4°F | Resp 12 | Ht 59.0 in | Wt 143.2 lb

## 2015-07-14 DIAGNOSIS — E038 Other specified hypothyroidism: Secondary | ICD-10-CM

## 2015-07-14 DIAGNOSIS — I10 Essential (primary) hypertension: Secondary | ICD-10-CM

## 2015-07-14 DIAGNOSIS — E034 Atrophy of thyroid (acquired): Secondary | ICD-10-CM | POA: Diagnosis not present

## 2015-07-14 DIAGNOSIS — T7840XS Allergy, unspecified, sequela: Secondary | ICD-10-CM

## 2015-07-14 DIAGNOSIS — S00269S Insect bite (nonvenomous) of unspecified eyelid and periocular area, sequela: Secondary | ICD-10-CM | POA: Diagnosis not present

## 2015-07-14 NOTE — Progress Notes (Signed)
Pre-visit discussion using our clinic review tool. No additional management support is needed unless otherwise documented below in the visit note.  

## 2015-07-14 NOTE — Progress Notes (Signed)
Subjective:  Patient ID: Christine Manning, female    DOB: 1946-05-17  Age: 69 y.o. MRN: [id number]  CC: The primary encounter diagnosis was Essential hypertension. Diagnoses of Allergic reaction, sequela and Hypothyroidism due to acquired atrophy of thyroid were also pertinent to this visit.  HPI Christine Manning presents for follow up on allergic reaction to mosquito bites.  Was treated with prednisone by Dr.  Caryl Bis. Swelling of eyelids has completely resolved.   Increased GFR  For the past year.  Drinks flavored water iwwith aritifical sweeteners  No appetite.  Doesn't get hungry,  Not exercising regularl,  Weight is stable.     Frequent headaches . Had eye exam needs glasses    thyroid disease,   Taking meds as directed.    Outpatient Prescriptions Prior to Visit  Medication Sig Dispense Refill  . albuterol (PROVENTIL HFA;VENTOLIN HFA) 108 (90 BASE) MCG/ACT inhaler Inhale 2 puffs into the lungs every 6 (six) hours as needed for wheezing or shortness of breath. 1 Inhaler 11  . aspirin 81 MG tablet Take 81 mg by mouth daily.    Marland Kitchen dicyclomine (BENTYL) 20 MG tablet Take 1 tablet (20 mg total) by mouth every morning. 30 tablet 1  . ibandronate (BONIVA) 150 MG tablet Take 1 tablet (150 mg total) by mouth every 30 (thirty) days. Take in the morning with a full glass of water, on an empty stomach, and do not take anything else by mouth or lie down for the next 30 min. 1 tablet 11  . Lactobacillus-Inulin ([place]) CAPS Take 1 capsule by mouth daily. 14 capsule 0  . levothyroxine (SYNTHROID, LEVOTHROID) 88 MCG tablet TAKE ONE TABLET BY MOUTH ONCE DAILY 90 tablet 0  . lisinopril (PRINIVIL,ZESTRIL) 20 MG tablet TAKE ONE TABLET BY MOUTH ONCE DAILY 90 tablet 0  . loratadine (CLARITIN) 10 MG tablet Take 1 tablet (10 mg total) by mouth daily. 30 tablet 0  . metoprolol tartrate (LOPRESSOR) 25 MG tablet Take 1 tablet (25 mg total) by mouth 2 (two) times daily. 180 tablet 1  .  Multiple Vitamin (MULTIVITAMIN) tablet Take 1 tablet by mouth daily.    . predniSONE (DELTASONE) 10 MG tablet Take 60 mg (6 tablets) by mouth on day 1, then take 50 mg (5 tablets) by mouth on day 2, then take 40 mg (4 tablets) by mouth on day 3, then take 30 mg (3 tablets) by mouth on day 4, then take 20 mg (2 tablets) by mouth on 5, then take 10 mg (1 tablet) by mouth on day 6. 21 tablet 0  . simvastatin (ZOCOR) 40 MG tablet Take 1 tablet (40 mg total) by mouth every evening. 90 tablet 1  . Vitamin D, Ergocalciferol, (DRISDOL) 50000 UNITS CAPS capsule TAKE ONE CAPSULE BY MOUTH ONCE A WEEK 12 capsule 0  . hydrOXYzine (ATARAX/VISTARIL) 25 MG tablet Take 25 mg by mouth 3 (three) times daily as needed.    . mometasone (NASONEX) 50 MCG/ACT nasal spray Place 2 sprays into the nose daily.    . pantoprazole (PROTONIX) 40 MG tablet Take 40 mg by mouth daily.    Marland Kitchen zoster vaccine live, PF, (ZOSTAVAX) 81856 UNT/0.65ML injection Inject 19,400 Units into the skin once. (Patient not taking: Reported on 07/14/2015) 1 each 0  . Fluticasone-Salmeterol (ADVAIR) 250-50 MCG/DOSE AEPB Inhale 1 puff into the lungs every 12 (twelve) hours. (Patient not taking: Reported on 07/14/2015) 60 each 6  . gabapentin (NEURONTIN) 100 MG capsule Take 1 capsule (  100 mg total) by mouth 3 (three) times daily. (Patient not taking: Reported on 07/14/2015) 30 capsule 3  . hydrochlorothiazide (HYDRODIURIL) 25 MG tablet TAKE ONE TABLET BY MOUTH ONCE DAILY (Patient not taking: Reported on 07/14/2015) 90 tablet 0  . hyoscyamine (LEVSIN SL) 0.125 MG SL tablet Place 1 tablet (0.125 mg total) under the tongue every 4 (four) hours as needed for cramping. (Patient not taking: Reported on 07/14/2015) 30 tablet 3  . meclizine (ANTIVERT) 25 MG tablet Take 1 tablet (25 mg total) by mouth 3 (three) times daily as needed for dizziness. (Patient not taking: Reported on 07/14/2015) 30 tablet 0  . meloxicam (MOBIC) 15 MG tablet Take 1 tablet (15 mg total) by  mouth daily. (Patient not taking: Reported on 07/14/2015) 30 tablet 2  . ondansetron (ZOFRAN) 4 MG tablet Take 1 tablet (4 mg total) by mouth every 8 (eight) hours as needed for nausea or vomiting. (Patient not taking: Reported on 07/14/2015) 30 tablet 1   No facility-administered medications prior to visit.    Review of Systems;  Patient denies headache, fevers, malaise, unintentional weight loss, skin rash, eye pain, sinus congestion and sinus pain, sore throat, dysphagia,  hemoptysis , cough, dyspnea, wheezing, chest pain, palpitations, orthopnea, edema, abdominal pain, nausea, melena, diarrhea, constipation, flank pain, dysuria, hematuria, urinary  Frequency, nocturia, numbness, tingling, seizures,  Focal weakness, Loss of consciousness,  Tremor, insomnia, depression, anxiety, and suicidal ideation.      Objective:  BP 124/76 mmHg  Pulse 59  Temp(Src) 98.4 F (36.9 C) (Oral)  Resp 12  Ht 4\' 11"  (1.499 m)  Wt 143 lb 4 oz (64.978 kg)  BMI 28.92 kg/m2  SpO2 98%  BP Readings from Last 3 Encounters:  07/14/15 124/76  07/08/15 100/68  07/03/15 114/68    Wt Readings from Last 3 Encounters:  07/14/15 143 lb 4 oz (64.978 kg)  07/03/15 147 lb (66.679 kg)  12/30/14 142 lb 12.8 oz (64.774 kg)    General appearance: alert, cooperative and appears stated age Ears: normal TM's and external ear canals both ears Throat: lips, mucosa, and tongue normal; teeth and gums normal Neck: no adenopathy, no carotid bruit, supple, symmetrical, trachea midline and thyroid not enlarged, symmetric, no tenderness/mass/nodules Back: symmetric, no curvature. ROM normal. No CVA tenderness. Lungs: clear to auscultation bilaterally Heart: regular rate and rhythm, S1, S2 normal, no murmur, click, rub or gallop Abdomen: soft, non-tender; bowel sounds normal; no masses,  no organomegaly Pulses: 2+ and symmetric Skin: Skin color, texture, turgor normal. No rashes or lesions Lymph nodes: Cervical,  supraclavicular, and axillary nodes normal.  No results found for: HGBA1C  Lab Results  Component Value Date   CREATININE 1.30* 07/08/2015   CREATININE 1.24* 06/23/2015   CREATININE 1.21* 06/16/2015    Lab Results  Component Value Date   WBC 6.2 12/30/2014   HGB 12.4 12/30/2014   HCT 36.7 12/30/2014   PLT 375.0 12/30/2014   GLUCOSE 94 07/08/2015   CHOL 227* 06/16/2015   TRIG 132.0 06/16/2015   HDL 49.30 06/16/2015   LDLDIRECT 160.1 08/27/2013   LDLCALC 151* 06/16/2015   ALT 10 06/16/2015   AST 18 06/16/2015   NA 144 07/08/2015   K 4.3 07/08/2015   CL 107 07/08/2015   CREATININE 1.30* 07/08/2015   BUN 15 07/08/2015   CO2 30 07/08/2015   TSH 3.19 06/16/2015   MICROALBUR 2.5* 07/05/2013    US Abdomen Limited Ruq  02/24/2015  CLINICAL DATA:  Right upper quadrant pain. EXAM:  US ABDOMEN LIMITED - RIGHT UPPER QUADRANT COMPARISON:  Ultrasound 07/25/2006 . FINDINGS: Gallbladder: No gallstones or wall thickening visualized. No sonographic Murphy sign noted. Common bile duct: Diameter: 1.8 mm Liver: No focal lesion identified. Within normal limits in parenchymal echogenicity. IMPRESSION: Normal exam. Electronically Signed   By: Marcello Moores  Register   On: 02/24/2015 09:09    Assessment & Plan:   Problem List Items Addressed This Visit    Hypertension - Primary    Well controlled on current regimen. Renal function has been elevated despite stopping hctz.  , no changes today.continue lisinopril for mild nephropathy  Nephrology eval  Lab Results  Component Value Date   CREATININE 1.30* 07/08/2015   Lab Results  Component Value Date   MICROALBUR 2.5* 07/05/2013            Hypothyroidism    Thyroid function is WNL on current dose.  No current changes needed.   Lab Results  Component Value Date   TSH 3.19 06/16/2015           Allergic reaction    Now resolved,  continue antihistamine          I have discontinued Ms. Tollie Pizza Fluticasone-Salmeterol, hyoscyamine,  meclizine, meloxicam, gabapentin, ondansetron, and hydrochlorothiazide. I am also having her maintain her hydrOXYzine, mometasone, pantoprazole, aspirin, [place], zoster vaccine live (PF), multivitamin, ibandronate, simvastatin, dicyclomine, metoprolol tartrate, levothyroxine, lisinopril, Vitamin D (Ergocalciferol), albuterol, predniSONE, and loratadine.  No orders of the defined types were placed in this encounter.    Medications Discontinued During This Encounter  Medication Reason  . meloxicam (MOBIC) 15 MG tablet   . Fluticasone-Salmeterol (ADVAIR) 250-50 MCG/DOSE AEPB   . gabapentin (NEURONTIN) 100 MG capsule   . hyoscyamine (LEVSIN SL) 0.125 MG SL tablet   . hydrochlorothiazide (HYDRODIURIL) 25 MG tablet   . ondansetron (ZOFRAN) 4 MG tablet   . meclizine (ANTIVERT) 25 MG tablet     Follow-up: No Follow-up on file.   Crecencio Mc, MD

## 2015-07-14 NOTE — Patient Instructions (Addendum)
You can try switching to generic zyrtec (cetirizine) for your anti histamine , once daily at bedtime   [employer] has years supply for < $20   I am referring you to [facility] to evaluate your kidney function .  Your thyroid function is normal.  We will repeat in 6 months   Please discontinue any artificial sweeteners,  Since they  May be contributing  to the kidney issue

## 2015-07-15 NOTE — Assessment & Plan Note (Signed)
Thyroid function is WNL on current dose.  No current changes needed.   Lab Results  Component Value Date   TSH 3.19 06/16/2015

## 2015-07-15 NOTE — Assessment & Plan Note (Signed)
Now resolved,  continue antihistamine

## 2015-07-15 NOTE — Assessment & Plan Note (Addendum)
Well controlled on current regimen. Renal function has been elevated despite stopping hctz.  , no changes today.continue lisinopril for mild nephropathy  Nephrology eval  Lab Results  Component Value Date   CREATININE 1.30* 07/08/2015   Lab Results  Component Value Date   MICROALBUR 2.5* 07/05/2013

## 2015-08-19 ENCOUNTER — Ambulatory Visit (INDEPENDENT_AMBULATORY_CARE_PROVIDER_SITE_OTHER): Payer: Medicare Other

## 2015-08-19 DIAGNOSIS — Z23 Encounter for immunization: Secondary | ICD-10-CM

## 2015-09-06 ENCOUNTER — Other Ambulatory Visit: Payer: Self-pay | Admitting: Internal Medicine

## 2015-09-10 ENCOUNTER — Encounter: Payer: Self-pay | Admitting: Internal Medicine

## 2015-09-10 ENCOUNTER — Ambulatory Visit (INDEPENDENT_AMBULATORY_CARE_PROVIDER_SITE_OTHER): Payer: Medicare Other | Admitting: Internal Medicine

## 2015-09-10 VITALS — BP 128/78 | HR 98 | Temp 97.4°F | Resp 12 | Ht 59.0 in | Wt 140.5 lb

## 2015-09-10 DIAGNOSIS — E034 Atrophy of thyroid (acquired): Secondary | ICD-10-CM

## 2015-09-10 DIAGNOSIS — Z1159 Encounter for screening for other viral diseases: Secondary | ICD-10-CM

## 2015-09-10 DIAGNOSIS — E785 Hyperlipidemia, unspecified: Secondary | ICD-10-CM | POA: Diagnosis not present

## 2015-09-10 DIAGNOSIS — Z23 Encounter for immunization: Secondary | ICD-10-CM

## 2015-09-10 DIAGNOSIS — D126 Benign neoplasm of colon, unspecified: Secondary | ICD-10-CM | POA: Diagnosis not present

## 2015-09-10 DIAGNOSIS — I10 Essential (primary) hypertension: Secondary | ICD-10-CM | POA: Diagnosis not present

## 2015-09-10 DIAGNOSIS — E038 Other specified hypothyroidism: Secondary | ICD-10-CM | POA: Diagnosis not present

## 2015-09-10 DIAGNOSIS — Z1239 Encounter for other screening for malignant neoplasm of breast: Secondary | ICD-10-CM

## 2015-09-10 DIAGNOSIS — Z0000 Encounter for general adult medical examination without abnormal findings: Secondary | ICD-10-CM

## 2015-09-10 DIAGNOSIS — R5383 Other fatigue: Secondary | ICD-10-CM

## 2015-09-10 DIAGNOSIS — D649 Anemia, unspecified: Secondary | ICD-10-CM

## 2015-09-10 DIAGNOSIS — E559 Vitamin D deficiency, unspecified: Secondary | ICD-10-CM | POA: Diagnosis not present

## 2015-09-10 LAB — CBC WITH DIFFERENTIAL/PLATELET
BASOS PCT: 1.9 % (ref 0.0–3.0)
Basophils Absolute: 0.1 10*3/uL (ref 0.0–0.1)
EOS PCT: 2.1 % (ref 0.0–5.0)
Eosinophils Absolute: 0.1 10*3/uL (ref 0.0–0.7)
HEMATOCRIT: 32.2 % — AB (ref 36.0–46.0)
HEMOGLOBIN: 10.6 g/dL — AB (ref 12.0–15.0)
LYMPHS PCT: 43.9 % (ref 12.0–46.0)
Lymphs Abs: 2.3 10*3/uL (ref 0.7–4.0)
MCHC: 32.8 g/dL (ref 30.0–36.0)
MCV: 93.4 fl (ref 78.0–100.0)
MONO ABS: 0.3 10*3/uL (ref 0.1–1.0)
Monocytes Relative: 5.4 % (ref 3.0–12.0)
Neutro Abs: 2.4 10*3/uL (ref 1.4–7.7)
Neutrophils Relative %: 46.7 % (ref 43.0–77.0)
Platelets: 340 10*3/uL (ref 150.0–400.0)
RBC: 3.45 Mil/uL — AB (ref 3.87–5.11)
RDW: 15.7 % — AB (ref 11.5–15.5)
WBC: 5.2 10*3/uL (ref 4.0–10.5)

## 2015-09-10 LAB — COMPREHENSIVE METABOLIC PANEL
ALT: 9 U/L (ref 0–35)
AST: 18 U/L (ref 0–37)
Albumin: 4.1 g/dL (ref 3.5–5.2)
Alkaline Phosphatase: 52 U/L (ref 39–117)
BUN: 12 mg/dL (ref 6–23)
CHLORIDE: 108 meq/L (ref 96–112)
CO2: 23 meq/L (ref 19–32)
Calcium: 9.1 mg/dL (ref 8.4–10.5)
Creatinine, Ser: 1.05 mg/dL (ref 0.40–1.20)
GFR: 67.02 mL/min (ref >60.00)
GLUCOSE: 88 mg/dL (ref 70–99)
POTASSIUM: 4.4 meq/L (ref 3.5–5.1)
SODIUM: 144 meq/L (ref 135–145)
Total Bilirubin: 0.3 mg/dL (ref 0.2–1.2)
Total Protein: 6.9 g/dL (ref 6.0–8.3)

## 2015-09-10 LAB — TSH: TSH: 40.97 u[IU]/mL — AB (ref 0.35–4.50)

## 2015-09-10 LAB — LIPID PANEL
CHOL/HDL RATIO: 5
CHOLESTEROL: 236 mg/dL — AB (ref 0–200)
HDL: 50.3 mg/dL (ref >39.00)
LDL CALC: 155 mg/dL — AB (ref 0–99)
NonHDL: 186.04
TRIGLYCERIDES: 153 mg/dL — AB (ref 0.0–149.0)
VLDL: 30.6 mg/dL (ref 0.0–40.0)

## 2015-09-10 LAB — LDL CHOLESTEROL, DIRECT: Direct LDL: 165 mg/dL

## 2015-09-10 LAB — VITAMIN D 25 HYDROXY (VIT D DEFICIENCY, FRACTURES): VITD: 8.05 ng/mL — ABNORMAL LOW (ref 30.00–100.00)

## 2015-09-10 MED ORDER — ZOSTER VACCINE LIVE 19400 UNT/0.65ML ~~LOC~~ SOLR: 0.6500 mL | Freq: Once | SUBCUTANEOUS | Status: DC

## 2015-09-10 NOTE — Progress Notes (Signed)
Pre-visit discussion using our clinic review tool. No additional management support is needed unless otherwise documented below in the visit note.  

## 2015-09-10 NOTE — Patient Instructions (Addendum)
For your insomnia:  You can dry taking benadryl or hydroxyzine before bedtime for insomnia.  Melatonin is another safe non habit forming. Turn off computers,  i phones and  i pads 2 hours before bedtime .  Old fashioned books are best to read in bed   Referral to Dr Christine Manning  To check your moles  Next colonosocpy due in 2021  Menopause is a normal process in which your reproductive ability comes to an end. This process happens gradually over a span of months to years, usually between the ages of 71 and 107. Menopause is complete when you have missed 12 consecutive menstrual periods. It is important to talk with your health care provider about some of the most common conditions that affect postmenopausal women, such as heart disease, cancer, and bone loss (osteoporosis). Adopting a healthy lifestyle and getting preventive care can help to promote your health and wellness. Those actions can also lower your chances of developing some of these common conditions. WHAT SHOULD I KNOW ABOUT MENOPAUSE? During menopause, you may experience a number of symptoms, such as:  Moderate-to-severe hot flashes.  Night sweats.  Decrease in sex drive.  Mood swings.  Headaches.  Tiredness.  Irritability.  Memory problems.  Insomnia. Choosing to treat or not to treat menopausal changes is an individual decision that you make with your health care provider. WHAT SHOULD I KNOW ABOUT HORMONE REPLACEMENT THERAPY AND SUPPLEMENTS? Hormone therapy products are effective for treating symptoms that are associated with menopause, such as hot flashes and night sweats. Hormone replacement carries certain risks, especially as you become older. If you are thinking about using estrogen or estrogen with progestin treatments, discuss the benefits and risks with your health care provider. WHAT SHOULD I KNOW ABOUT HEART DISEASE AND STROKE? Heart disease, heart attack, and stroke become more likely as you age. This may be  due, in part, to the hormonal changes that your body experiences during menopause. These can affect how your body processes dietary fats, triglycerides, and cholesterol. Heart attack and stroke are both medical emergencies. There are many things that you can do to help prevent heart disease and stroke:  Have your blood pressure checked at least every 1-2 years. High blood pressure causes heart disease and increases the risk of stroke.  If you are 93-44 years old, ask your health care provider if you should take aspirin to prevent a heart attack or a stroke.  Do not use any tobacco products, including cigarettes, chewing tobacco, or electronic cigarettes. If you need help quitting, ask your health care provider.  It is important to eat a healthy diet and maintain a healthy weight.  Be sure to include plenty of vegetables, fruits, low-fat dairy products, and lean protein.  Avoid eating foods that are high in solid fats, added sugars, or salt (sodium).  Get regular exercise. This is one of the most important things that you can do for your health.  Try to exercise for at least 150 minutes each week. The type of exercise that you do should increase your heart rate and make you sweat. This is known as moderate-intensity exercise.  Try to do strengthening exercises at least twice each week. Do these in addition to the moderate-intensity exercise.  Know your numbers.Ask your health care provider to check your cholesterol and your blood glucose. Continue to have your blood tested as directed by your health care provider. WHAT SHOULD I KNOW ABOUT CANCER SCREENING? There are several types of cancer. Take the  following steps to reduce your risk and to catch any cancer development as early as possible. Breast Cancer  Practice breast self-awareness.  This means understanding how your breasts normally appear and feel.  It also means doing regular breast self-exams. Let your health care provider know  about any changes, no matter how small.  If you are 8 or older, have a clinician do a breast exam (clinical breast exam or CBE) every year. Depending on your age, family history, and medical history, it may be recommended that you also have a yearly breast X-ray (mammogram).  If you have a family history of breast cancer, talk with your health care provider about genetic screening.  If you are at high risk for breast cancer, talk with your health care provider about having an MRI and a mammogram every year.  Breast cancer (BRCA) gene test is recommended for women who have family members with BRCA-related cancers. Results of the assessment will determine the need for genetic counseling and BRCA1 and for BRCA2 testing. BRCA-related cancers include these types:  Breast. This occurs in males or females.  Ovarian.  Tubal. This may also be called fallopian tube cancer.  Cancer of the abdominal or pelvic lining (peritoneal cancer).  Prostate.  Pancreatic. Cervical, Uterine, and Ovarian Cancer Your health care provider may recommend that you be screened regularly for cancer of the pelvic organs. These include your ovaries, uterus, and vagina. This screening involves a pelvic exam, which includes checking for microscopic changes to the surface of your cervix (Pap test).  For women ages 21-65, health care providers may recommend a pelvic exam and a Pap test every three years. For women ages 22-65, they may recommend the Pap test and pelvic exam, combined with testing for human papilloma virus (HPV), every five years. Some types of HPV increase your risk of cervical cancer. Testing for HPV may also be done on women of any age who have unclear Pap test results.  Other health care providers may not recommend any screening for nonpregnant women who are considered low risk for pelvic cancer and have no symptoms. Ask your health care provider if a screening pelvic exam is right for you.  If you have had  past treatment for cervical cancer or a condition that could lead to cancer, you need Pap tests and screening for cancer for at least 20 years after your treatment. If Pap tests have been discontinued for you, your risk factors (such as having a new sexual partner) need to be reassessed to determine if you should start having screenings again. Some women have medical problems that increase the chance of getting cervical cancer. In these cases, your health care provider may recommend that you have screening and Pap tests more often.  If you have a family history of uterine cancer or ovarian cancer, talk with your health care provider about genetic screening.  If you have vaginal bleeding after reaching menopause, tell your health care provider.  There are currently no reliable tests available to screen for ovarian cancer. Lung Cancer Lung cancer screening is recommended for adults 61-40 years old who are at high risk for lung cancer because of a history of smoking. A yearly low-dose CT scan of the lungs is recommended if you:  Currently smoke.  Have a history of at least 30 pack-years of smoking and you currently smoke or have quit within the past 15 years. A pack-year is smoking an average of one pack of cigarettes per day for one  year. Yearly screening should:  Continue until it has been 15 years since you quit.  Stop if you develop a health problem that would prevent you from having lung cancer treatment. Colorectal Cancer  This type of cancer can be detected and can often be prevented.  Routine colorectal cancer screening usually begins at age 15 and continues through age 62.  If you have risk factors for colon cancer, your health care provider may recommend that you be screened at an earlier age.  If you have a family history of colorectal cancer, talk with your health care provider about genetic screening.  Your health care provider may also recommend using home test kits to check  for hidden blood in your stool.  A small camera at the end of a tube can be used to examine your colon directly (sigmoidoscopy or colonoscopy). This is done to check for the earliest forms of colorectal cancer.  Direct examination of the colon should be repeated every 5-10 years until age 4. However, if early forms of precancerous polyps or small growths are found or if you have a family history or genetic risk for colorectal cancer, you may need to be screened more often. Skin Cancer  Check your skin from head to toe regularly.  Monitor any moles. Be sure to tell your health care provider:  About any new moles or changes in moles, especially if there is a change in a mole's shape or color.  If you have a mole that is larger than the size of a pencil eraser.  If any of your family members has a history of skin cancer, especially at a young age, talk with your health care provider about genetic screening.  Always use sunscreen. Apply sunscreen liberally and repeatedly throughout the day.  Whenever you are outside, protect yourself by wearing long sleeves, pants, a wide-brimmed hat, and sunglasses. WHAT SHOULD I KNOW ABOUT OSTEOPOROSIS? Osteoporosis is a condition in which bone destruction happens more quickly than new bone creation. After menopause, you may be at an increased risk for osteoporosis. To help prevent osteoporosis or the bone fractures that can happen because of osteoporosis, the following is recommended:  If you are 50-17 years old, get at least 1,000 mg of calcium and at least 600 mg of vitamin D per day.  If you are older than age 33 but younger than age 33, get at least 1,200 mg of calcium and at least 600 mg of vitamin D per day.  If you are older than age 20, get at least 1,200 mg of calcium and at least 800 mg of vitamin D per day. Smoking and excessive alcohol intake increase the risk of osteoporosis. Eat foods that are rich in calcium and vitamin D, and do  weight-bearing exercises several times each week as directed by your health care provider. WHAT SHOULD I KNOW ABOUT HOW MENOPAUSE AFFECTS [place]? Depression may occur at any age, but it is more common as you become older. Common symptoms of depression include:  Low or sad mood.  Changes in sleep patterns.  Changes in appetite or eating patterns.  Feeling an overall lack of motivation or enjoyment of activities that you previously enjoyed.  Frequent crying spells. Talk with your health care provider if you think that you are experiencing depression. WHAT SHOULD I KNOW ABOUT IMMUNIZATIONS? It is important that you get and maintain your immunizations. These include:  Tetanus, diphtheria, and pertussis (Tdap) booster vaccine.  Influenza every year before the  flu season begins.  Pneumonia vaccine.  Shingles vaccine. Your health care provider may also recommend other immunizations.   This information is not intended to replace advice given to you by your health care provider. Make sure you discuss any questions you have with your health care provider.   Document Released: 10/29/2005 Document Revised: 09/27/2014 Document Reviewed: 05/09/2014 Elsevier Interactive Patient Education [employer].

## 2015-09-10 NOTE — Progress Notes (Addendum)
Subjective:   Christine Manning is a 69 y.o. female who presents for Medicare Annual (Subsequent) preventive examination.  Review of Systems:  No ROS.  Medicare Wellness Visit. Cardiac Risk Factors include: advanced age (>79men, >31 women)     Objective:     Vitals: BP 128/78 mmHg  Pulse 98  Temp(Src) 97.4 F (36.3 C) (Oral)  Resp 12  Ht 4\' 11"  (1.499 m)  Wt 140 lb 8 oz (63.73 kg)  BMI 28.36 kg/m2  SpO2 98%  Tobacco History  Smoking status  . Never Smoker   Smokeless tobacco  . Never Used     Counseling given: Not Answered   Past Medical History  Diagnosis Date  . Hyperlipidemia   . Hypertension   . Asthma   . Personal history of DVT (deep vein thrombosis) 1980    x 2, pregnanacy and immobility induced   . DDD (degenerative disc disease)   . Hyperthyroidism   . Colon polyp    Past Surgical History  Procedure Laterality Date  . Bladder suspension  2003  . Abdominal hysterectomy  1991    secondary to abnormal PAPs  . Coronary angioplasty  2013    arteries were clean .  Dr. Rona Ravens at [facility]   . Knee arthroscopy    . Colonoscopy  2016   Family History  Problem Relation Age of Onset  . Mental illness Mother     multi infarct dementia  . Stroke Mother   . Asthma Mother   . Mental illness Father     alzheimers dementia  . Asthma Brother   . Asthma Son   . Colon cancer Maternal Aunt 68  . Uterine cancer Maternal Grandmother   . Colon polyps Sister   . Diabetes Neg Hx   . Kidney disease Neg Hx   . Esophageal cancer Neg Hx   . Asthma Daughter   . Colon cancer Cousin     40's   History  Sexual Activity  . Sexual Activity: Not Currently    Outpatient Encounter Prescriptions as of 09/10/2015  Medication Sig  . albuterol (PROVENTIL HFA;VENTOLIN HFA) 108 (90 BASE) MCG/ACT inhaler Inhale 2 puffs into the lungs every 6 (six) hours as needed for wheezing or shortness of breath.  Marland Kitchen aspirin 81 MG tablet Take 81 mg by mouth daily.  Marland Kitchen dicyclomine (BENTYL) 20 MG  tablet Take 1 tablet (20 mg total) by mouth every morning.  . hydrOXYzine (ATARAX/VISTARIL) 25 MG tablet Take 25 mg by mouth 3 (three) times daily as needed.  . ibandronate (BONIVA) 150 MG tablet Take 1 tablet (150 mg total) by mouth every 30 (thirty) days. Take in the morning with a full glass of water, on an empty stomach, and do not take anything else by mouth or lie down for the next 30 min.  . Lactobacillus-Inulin ([place]) CAPS Take 1 capsule by mouth daily.  Marland Kitchen levothyroxine (SYNTHROID, LEVOTHROID) 100 MCG tablet Take 1 tablet (100 mcg total) by mouth daily before breakfast.  . lisinopril (PRINIVIL,ZESTRIL) 20 MG tablet TAKE ONE TABLET BY MOUTH ONCE DAILY  . loratadine (CLARITIN) 10 MG tablet Take 1 tablet (10 mg total) by mouth daily.  . metoprolol tartrate (LOPRESSOR) 25 MG tablet Take 1 tablet (25 mg total) by mouth 2 (two) times daily.  . mometasone (NASONEX) 50 MCG/ACT nasal spray Place 2 sprays into the nose daily.  . Multiple Vitamin (MULTIVITAMIN) tablet Take 1 tablet by mouth daily.  . pantoprazole (PROTONIX) 40 MG  tablet Take 40 mg by mouth daily.  . predniSONE (DELTASONE) 10 MG tablet Take 60 mg (6 tablets) by mouth on day 1, then take 50 mg (5 tablets) by mouth on day 2, then take 40 mg (4 tablets) by mouth on day 3, then take 30 mg (3 tablets) by mouth on day 4, then take 20 mg (2 tablets) by mouth on 5, then take 10 mg (1 tablet) by mouth on day 6.  . simvastatin (ZOCOR) 40 MG tablet Take 1 tablet (40 mg total) by mouth every evening.  . Vitamin D, Ergocalciferol, (DRISDOL) 50000 UNITS CAPS capsule TAKE ONE CAPSULE BY MOUTH ONCE A WEEK  . zoster vaccine live, PF, (ZOSTAVAX) 91478 UNT/0.65ML injection Inject 19,400 Units into the skin once.  . [DISCONTINUED] levothyroxine (SYNTHROID, LEVOTHROID) 88 MCG tablet TAKE ONE TABLET BY MOUTH ONCE DAILY  . zoster vaccine live, PF, (ZOSTAVAX) 29562 UNT/0.65ML injection Inject 19,400 Units into the skin once.   No  facility-administered encounter medications on file as of 09/10/2015.    Activities of Daily Living In your present state of health, do you have any difficulty performing the following activities: 09/10/2015  Hearing? N  Vision? N  Difficulty concentrating or making decisions? N  Walking or climbing stairs? Y  Dressing or bathing? N  Doing errands, shopping? N  Preparing Food and eating ? N  Using the Toilet? N  In the past six months, have you accidently leaked urine? Y  Do you have problems with loss of bowel control? Y  Managing your Medications? N  Managing your Finances? N  Housekeeping or managing your Housekeeping? N    Patient Care Team: Crecencio Mc, MD as PCP - General (Internal Medicine)    Assessment:   This is a routine wellness examination for Christine Manning. The goal of the wellness visit is to assist the patient how to close the gaps in care and create a preventative care plan for the patient.   VIT D Calcium as appropriate/ Osteoporosis risk reviewed.  Medications reviewed; taking without issues or barriers.   Safety issues reviewed; smoke detectors in the home. No firearms in the home. Wears seatbelts when driving or riding with others. No violence in the home.  ZOSTAVAX postponed per patient request.  No identified risk were noted; The patient was oriented x 3; appropriate in dress and manner and no objective failures at ADL's or IADL's.   Patient Concerns:  Hx of IBS; Worsening uncontrollable gas and random diarrhea episodes.  Currently taking CULTURELLE daily.   Deferred to follow up with PCP.  Exercise Activities and Dietary recommendations Current Exercise Habits:: Home exercise routine, Type of exercise: treadmill, Time (Minutes): 30, Frequency (Times/Week): 3, Weekly Exercise (Minutes/Week): 90, Intensity: Mild  Goals    . Increase physical activity     Start water aerobics with the [employer] program for 3 days a week with a friend.       Fall Risk Fall Risk  09/10/2015 09/10/2015 09/06/2014 07/05/2013  Falls in the past year? No No No No   Depression Screen PHQ 2/9 Scores 09/10/2015 09/10/2015 09/06/2014  PHQ - 2 Score 1 1 0     Cognitive Testing MMSE - Mini Mental State Exam 09/10/2015  Orientation to time 5  Orientation to Place 5  Registration 3  Attention/ Calculation 5  Recall 3  Language- name 2 objects 2  Language- repeat 1  Language- follow 3 step command 3  Language- read & follow direction 1  Write  a sentence 1  Copy design 1  Total score 30    Immunization History  Administered Date(s) Administered  . Influenza, High Dose Seasonal PF 08/19/2015  . Influenza,inj,Quad PF,36+ Mos 07/05/2013, 06/17/2014  . Influenza-Unspecified 07/21/2012  . PPD Test 06/17/2014  . Pneumococcal Conjugate-13 07/05/2013  . Pneumococcal Polysaccharide-23 09/10/2015  . Tdap 06/18/2014   Screening Tests Health Maintenance  Topic Date Due  . ZOSTAVAX  09/06/2007  . MAMMOGRAM  07/18/2015  . INFLUENZA VACCINE  04/20/2016  . COLONOSCOPY  11/16/2019  . TETANUS/TDAP  06/18/2024  . DEXA SCAN  Completed  . Hepatitis C Screening  Completed  . PNA vac Low Risk Adult  Completed      Plan:    End of life planning; Advance aging; Advanced directives discussed. Copy requested of current HCPOA/Living Will.   Follow up with PCP as needed.  During the course of the visit the patient was educated and counseled about the following appropriate screening and preventive services:   Vaccines to include Pneumoccal, Influenza, Hepatitis B, Td, Zostavax, HCV  Electrocardiogram  Cardiovascular Disease  Colorectal cancer screening  Bone density screening  Diabetes screening  Glaucoma screening  Mammography/PAP  Nutrition counseling   Patient Instructions (the written plan) was given to the patient.   Crecencio Mc, MD  09/15/2015

## 2015-09-10 NOTE — Progress Notes (Signed)
Patient ID: Christine Manning, female    DOB: October 31, 1945  Age: 69 y.o. MRN: [id number]  The patient is here for annual PHYSICAL examination and management of other chronic and acute problems.    COLONOSCOPY 2016  ONE POLYP  TUBULAR ADENOMA   5 YR FOLLOW UP 2021 Breast exam done  Several moles,  One with an  Irregular border under left breast,  And one  on right calf     The risk factors are reflected in the social history.  The roster of all physicians providing medical care to patient - is listed in the Snapshot section of the chart.  Activities of daily living:  The patient is 100% independent in all ADLs: dressing, toileting, feeding as well as independent mobility  Home safety : The patient has smoke detectors in the home. They wear seatbelts.  There are no firearms at home. There is no violence in the home.   There is no risks for hepatitis, STDs or HIV. There is no   history of blood transfusion. They have no travel history to infectious disease endemic areas of the world.  The patient has seen their dentist in the last six month. They have seen their eye doctor in August They admit to slight hearing difficulty with regard to whispered voices and some television programs.  They have deferred audiologic testing in the last year.  They do not  have excessive sun exposure. Discussed the need for sun protection: hats, long sleeves and use of sunscreen if there is significant sun exposure.   Diet: the importance of a healthy diet is discussed. They do have a healthy diet.  The benefits of regular aerobic exercise were discussed. She walks 4 times per week ,  20 minutes.   Depression screen: there are no signs or vegative symptoms of depression- irritability, change in appetite, anhedonia, sadness/tearfullness.  Cognitive assessment: the patient manages all their financial and personal affairs and is actively engaged. They could relate day,date,year and events; recalled 2/3 objects at 3  minutes; performed clock-face test normally.  The following portions of the patient's history were reviewed and updated as appropriate: allergies, current medications, past family history, past medical history,  past surgical history, past social history  and problem list.  Visual acuity was not assessed per patient preference since she has regular follow up with her ophthalmologist. Hearing and body mass index were assessed and reviewed.   During the course of the visit the patient was educated and counseled about appropriate screening and preventive services including : fall prevention , diabetes screening, nutrition counseling, colorectal cancer screening, and recommended immunizations.    CC: The primary encounter diagnosis was Hyperlipidemia. Diagnoses of Essential hypertension, Tubular adenoma of colon, Hypothyroidism due to acquired atrophy of thyroid, Breast cancer screening, Need for hepatitis C screening test, Other fatigue, Vitamin D deficiency, Need for prophylactic vaccination against Streptococcus pneumoniae (pneumococcus), Routine history and physical examination of adult, and Encounter for preventive health examination were also pertinent to this visit. Insomnia,  3-4 hours total  Trouble both ways.  Used to work 3rd shift, used to to care for ailing father Wakes up at  [place] has a past medical history of Hyperlipidemia; Hypertension; Asthma; Personal history of DVT (deep vein thrombosis) (1980); DDD (degenerative disc disease); Hyperthyroidism; and Colon polyp.   She has past surgical history that includes Bladder suspension (2003); Abdominal hysterectomy (1991); Coronary angioplasty (2013); Knee arthroscopy; and Colonoscopy (2016).   Her family history includes Asthma in  her brother, daughter, mother, and son; Colon cancer in her cousin; Colon cancer (age of onset: 6) in her maternal aunt; Colon polyps in her sister; Mental illness in her father and mother; Stroke in her  mother; Uterine cancer in her maternal grandmother. There is no history of Diabetes, Kidney disease, or Esophageal cancer.She reports that she has never smoked. She has never used smokeless tobacco. She reports that she does not drink alcohol or use illicit drugs.  Outpatient Prescriptions Prior to Visit  Medication Sig Dispense Refill  . albuterol (PROVENTIL HFA;VENTOLIN HFA) 108 (90 BASE) MCG/ACT inhaler Inhale 2 puffs into the lungs every 6 (six) hours as needed for wheezing or shortness of breath. 1 Inhaler 11  . aspirin 81 MG tablet Take 81 mg by mouth daily.    Marland Kitchen dicyclomine (BENTYL) 20 MG tablet Take 1 tablet (20 mg total) by mouth every morning. 30 tablet 1  . hydrOXYzine (ATARAX/VISTARIL) 25 MG tablet Take 25 mg by mouth 3 (three) times daily as needed.    . ibandronate (BONIVA) 150 MG tablet Take 1 tablet (150 mg total) by mouth every 30 (thirty) days. Take in the morning with a full glass of water, on an empty stomach, and do not take anything else by mouth or lie down for the next 30 min. 1 tablet 11  . Lactobacillus-Inulin ([place]) CAPS Take 1 capsule by mouth daily. 14 capsule 0  . levothyroxine (SYNTHROID, LEVOTHROID) 88 MCG tablet TAKE ONE TABLET BY MOUTH ONCE DAILY 90 tablet 0  . lisinopril (PRINIVIL,ZESTRIL) 20 MG tablet TAKE ONE TABLET BY MOUTH ONCE DAILY 90 tablet 0  . loratadine (CLARITIN) 10 MG tablet Take 1 tablet (10 mg total) by mouth daily. 30 tablet 0  . metoprolol tartrate (LOPRESSOR) 25 MG tablet Take 1 tablet (25 mg total) by mouth 2 (two) times daily. 180 tablet 1  . mometasone (NASONEX) 50 MCG/ACT nasal spray Place 2 sprays into the nose daily.    . Multiple Vitamin (MULTIVITAMIN) tablet Take 1 tablet by mouth daily.    . pantoprazole (PROTONIX) 40 MG tablet Take 40 mg by mouth daily.    . predniSONE (DELTASONE) 10 MG tablet Take 60 mg (6 tablets) by mouth on day 1, then take 50 mg (5 tablets) by mouth on day 2, then take 40 mg (4 tablets) by  mouth on day 3, then take 30 mg (3 tablets) by mouth on day 4, then take 20 mg (2 tablets) by mouth on 5, then take 10 mg (1 tablet) by mouth on day 6. 21 tablet 0  . simvastatin (ZOCOR) 40 MG tablet Take 1 tablet (40 mg total) by mouth every evening. 90 tablet 1  . Vitamin D, Ergocalciferol, (DRISDOL) 50000 UNITS CAPS capsule TAKE ONE CAPSULE BY MOUTH ONCE A WEEK 12 capsule 0  . zoster vaccine live, PF, (ZOSTAVAX) 29562 UNT/0.65ML injection Inject 19,400 Units into the skin once. 1 each 0   No facility-administered medications prior to visit.    Review of Systems   Patient denies headache, fevers, malaise, unintentional weight loss, skin rash, eye pain, sinus congestion and sinus pain, sore throat, dysphagia,  hemoptysis , cough, dyspnea, wheezing, chest pain, palpitations, orthopnea, edema, abdominal pain, nausea, melena, diarrhea, constipation, flank pain, dysuria, hematuria, urinary  Frequency, nocturia, numbness, tingling, seizures,  Focal weakness, Loss of consciousness,  Tremor, insomnia, depression, anxiety, and suicidal ideation.      Objective:  BP 128/78 mmHg  Pulse 98  Temp(Src) 97.4 F (36.3 C) (  Oral)  Resp 12  Ht 4\' 11"  (1.499 m)  Wt 140 lb 8 oz (63.73 kg)  BMI 28.36 kg/m2  SpO2 98%  Physical Exam  General appearance: alert, cooperative and appears stated age Head: Normocephalic, without obvious abnormality, atraumatic Eyes: conjunctivae/corneas clear. PERRL, EOM's intact. Fundi benign. Ears: normal TM's and external ear canals both ears Nose: Nares normal. Septum midline. Mucosa normal. No drainage or sinus tenderness. Throat: lips, mucosa, and tongue normal; teeth and gums normal Neck: no adenopathy, no carotid bruit, no JVD, supple, symmetrical, trachea midline and thyroid not enlarged, symmetric, no tenderness/mass/nodules Lungs: clear to auscultation bilaterally Breasts: normal appearance, no masses or tenderness Heart: regular rate and rhythm, S1, S2 normal, no  murmur, click, rub or gallop Abdomen: soft, non-tender; bowel sounds normal; no masses,  no organomegaly Extremities: extremities normal, atraumatic, no cyanosis or edema Pulses: 2+ and symmetric Skin: Skin color, texture, turgor normal. No rashes or lesions Neurologic: Alert and oriented X 3, normal strength and tone. Normal symmetric reflexes. Normal coordination and gait.   Assessment & Plan:   Problem List Items Addressed This Visit    Encounter for preventive health examination    Annual comprehensive preventive exam was done as well as an evaluation and management of chronic conditions .  During the course of the visit the patient was educated and counseled about appropriate screening and preventive services including :  diabetes screening, lipid analysis with projected  10 year  risk for CAD  using the Framingham risk calculator for women, , nutrition counseling, colorectal cancer screening, and recommended immunizations.  Printed recommendations for health maintenance screenings was given.       Tubular adenoma of colon   Hypertension   Relevant Orders   Comprehensive metabolic panel (Completed)   Hypothyroidism   Relevant Orders   TSH (Completed)   Hyperlipidemia - Primary   Relevant Orders   LDL cholesterol, direct (Completed)   Lipid panel (Completed)    Other Visit Diagnoses    Breast cancer screening        Relevant Orders    MM DIGITAL SCREENING BILATERAL    Need for hepatitis C screening test        Relevant Orders    Hepatitis C antibody (Completed)    Other fatigue        Relevant Orders    CBC with Differential/Platelet (Completed)    Vitamin D deficiency        Relevant Orders    VITAMIN D 25 Hydroxy (Vit-D Deficiency, Fractures) (Completed)    Need for prophylactic vaccination against Streptococcus pneumoniae (pneumococcus)        Relevant Orders    Pneumococcal polysaccharide vaccine 23-valent greater than or equal to 2yo subcutaneous/IM (Completed)     Routine history and physical examination of adult           I am having Ms. Gadison start on zoster vaccine live (PF). I am also having her maintain her hydrOXYzine, mometasone, pantoprazole, aspirin, [place], zoster vaccine live (PF), multivitamin, ibandronate, simvastatin, dicyclomine, metoprolol tartrate, levothyroxine, Vitamin D (Ergocalciferol), albuterol, predniSONE, loratadine, and lisinopril.  Meds ordered this encounter  Medications  . zoster vaccine live, PF, (ZOSTAVAX) 60454 UNT/0.65ML injection    Sig: Inject 19,400 Units into the skin once.    Dispense:  1 each    Refill:  0    There are no discontinued medications.  Follow-up: Return in about 6 months (around 03/10/2016).   Crecencio Mc, MD

## 2015-09-11 DIAGNOSIS — Z0000 Encounter for general adult medical examination without abnormal findings: Secondary | ICD-10-CM | POA: Insufficient documentation

## 2015-09-11 LAB — HEPATITIS C ANTIBODY: HCV AB: NEGATIVE

## 2015-09-11 NOTE — Progress Notes (Signed)
  I have reviewed the above information and agree with above.   Nichalas Coin, MD 

## 2015-09-11 NOTE — Assessment & Plan Note (Signed)

## 2015-09-15 MED ORDER — LEVOTHYROXINE SODIUM 100 MCG PO TABS: 100.0000 ug | ORAL_TABLET | Freq: Every day | ORAL | Status: DC

## 2015-09-15 MED ORDER — ERGOCALCIFEROL 1.25 MG (50000 UT) PO CAPS: 50000.0000 [IU] | ORAL_CAPSULE | ORAL | Status: DC

## 2015-09-15 NOTE — Assessment & Plan Note (Signed)
Patient's thyroid function is very underactive on current levothyroxine dose of 88 mcg daily.  Principles of proper administration reviewed; patient has not missed more than one dose in the last 6 weeks and is taking the medication alone on an empty stomach.  Will increase dose to 100 mcg daily and repeat TSH in 6 weeks.

## 2015-09-15 NOTE — Addendum Note (Signed)
Addended by: Crecencio Mc on: 09/15/2015 01:03 PM   Modules accepted: Orders

## 2015-09-15 NOTE — Assessment & Plan Note (Signed)
Very low.  Megadose x 3 months,  Then 2000 IUs daily

## 2015-09-15 NOTE — Addendum Note (Signed)
Addended by: Crecencio Mc on: 09/15/2015 12:55 PM   Modules accepted: Orders

## 2015-09-15 NOTE — Assessment & Plan Note (Signed)
Elevated today due to underactive thyroid  Will repeat once thyroid function has normalized.

## 2015-09-18 ENCOUNTER — Ambulatory Visit
Admission: RE | Admit: 2015-09-18 | Discharge: 2015-09-18 | Disposition: A | Discharge status: Home / Self Care | Payer: Medicare Other | Source: Ambulatory Visit | Attending: Internal Medicine | Admitting: Internal Medicine

## 2015-09-18 ENCOUNTER — Other Ambulatory Visit: Payer: Self-pay | Admitting: Internal Medicine

## 2015-09-18 DIAGNOSIS — Z1231 Encounter for screening mammogram for malignant neoplasm of breast: Secondary | ICD-10-CM | POA: Diagnosis not present

## 2015-09-18 DIAGNOSIS — Z1239 Encounter for other screening for malignant neoplasm of breast: Secondary | ICD-10-CM

## 2015-10-17 ENCOUNTER — Encounter: Payer: Self-pay | Admitting: Internal Medicine

## 2015-10-17 ENCOUNTER — Ambulatory Visit (INDEPENDENT_AMBULATORY_CARE_PROVIDER_SITE_OTHER): Payer: Medicare Other | Admitting: Internal Medicine

## 2015-10-17 VITALS — BP 148/82 | HR 56 | Temp 97.7°F | Resp 12 | Ht 59.0 in | Wt 143.4 lb

## 2015-10-17 DIAGNOSIS — F4321 Adjustment disorder with depressed mood: Secondary | ICD-10-CM | POA: Diagnosis not present

## 2015-10-17 DIAGNOSIS — I10 Essential (primary) hypertension: Secondary | ICD-10-CM | POA: Diagnosis not present

## 2015-10-17 DIAGNOSIS — E038 Other specified hypothyroidism: Secondary | ICD-10-CM | POA: Diagnosis not present

## 2015-10-17 DIAGNOSIS — E034 Atrophy of thyroid (acquired): Secondary | ICD-10-CM

## 2015-10-17 DIAGNOSIS — R143 Flatulence: Secondary | ICD-10-CM

## 2015-10-17 DIAGNOSIS — E785 Hyperlipidemia, unspecified: Secondary | ICD-10-CM

## 2015-10-17 MED ORDER — DICYCLOMINE HCL 20 MG PO TABS: 20.0000 mg | ORAL_TABLET | Freq: Three times a day (TID) | ORAL | Status: DC

## 2015-10-17 NOTE — Patient Instructions (Addendum)
Return in mid February  for fasting labs  Try using Beano with every meal containing a vegetable.  Gas X can be used after a  meal if needed  Probiotics are available in liquid form as a beverage in the fresh vegetable  Section of [employer] . 1-2 ounces daily may help your digestive system  (Kombucha ,  Kevita,  Are 2 brand names )   I am so sorry about losing Wendee Beavers,  Be comforted by the fact that you gave him a good life,  And you will see him again

## 2015-10-17 NOTE — Progress Notes (Signed)
Subjective:  Patient ID: Christine Manning, female    DOB: 10-04-1945  Age: 70 y.o. MRN: [id number]  CC: The primary encounter diagnosis was Hypothyroidism due to acquired atrophy of thyroid. Diagnoses of Essential hypertension, Grief reaction, Hyperlipidemia, and Flatulence were also pertinent to this visit.     Christine Manning presents for follow up on hypothyroidism, hypertension and hyperlipidemia  Bp elevation discussed today. Her beloved dog of 6 years, , Vega   died last night.  She just came home and found him dead, he had not been sick  Advertising copywriter.   Underactive thyroid despite regular dosing of levothyroxine [facility] has changed suppliers .which may have been the cause .  Not due for two weeks    On a waiting list for senior housing.    Had a trip to [place] last week and had a bout of IBS with several bouts of loose stools. was curious about any new drugs   Patient  has been experiencing excessive gas that has been troubling and hard to control. Diet reviewed and is balanced in favor of mostly vegetarian with legumes and cruciferous vegetables on a daily basis, and a moderate amount of dairy.  Denies constipation   Outpatient Prescriptions Prior to Visit  Medication Sig Dispense Refill  . albuterol (PROVENTIL HFA;VENTOLIN HFA) 108 (90 BASE) MCG/ACT inhaler Inhale 2 puffs into the lungs every 6 (six) hours as needed for wheezing or shortness of breath. 1 Inhaler 11  . aspirin 81 MG tablet Take 81 mg by mouth daily.    . ergocalciferol (DRISDOL) 50000 UNITS capsule Take 1 capsule (50,000 Units total) by mouth once a week. 12 capsule 0  . hydrOXYzine (ATARAX/VISTARIL) 25 MG tablet Take 25 mg by mouth 3 (three) times daily as needed.    . ibandronate (BONIVA) 150 MG tablet Take 1 tablet (150 mg total) by mouth every 30 (thirty) days. Take in the morning with a full glass of water, on an empty stomach, and do not take anything else by mouth or lie down for the next 30 min. 1 tablet 11  .  Lactobacillus-Inulin ([place]) CAPS Take 1 capsule by mouth daily. 14 capsule 0  . levothyroxine (SYNTHROID, LEVOTHROID) 100 MCG tablet Take 1 tablet (100 mcg total) by mouth daily before breakfast. 90 tablet 0  . lisinopril (PRINIVIL,ZESTRIL) 20 MG tablet TAKE ONE TABLET BY MOUTH ONCE DAILY 90 tablet 0  . loratadine (CLARITIN) 10 MG tablet Take 1 tablet (10 mg total) by mouth daily. 30 tablet 0  . metoprolol tartrate (LOPRESSOR) 25 MG tablet Take 1 tablet (25 mg total) by mouth 2 (two) times daily. 180 tablet 1  . mometasone (NASONEX) 50 MCG/ACT nasal spray Place 2 sprays into the nose daily.    . Multiple Vitamin (MULTIVITAMIN) tablet Take 1 tablet by mouth daily.    . pantoprazole (PROTONIX) 40 MG tablet Take 40 mg by mouth daily.    . predniSONE (DELTASONE) 10 MG tablet Take 60 mg (6 tablets) by mouth on day 1, then take 50 mg (5 tablets) by mouth on day 2, then take 40 mg (4 tablets) by mouth on day 3, then take 30 mg (3 tablets) by mouth on day 4, then take 20 mg (2 tablets) by mouth on 5, then take 10 mg (1 tablet) by mouth on day 6. 21 tablet 0  . simvastatin (ZOCOR) 40 MG tablet Take 1 tablet (40 mg total) by mouth every evening. 90 tablet 1  . Vitamin  D, Ergocalciferol, (DRISDOL) 50000 UNITS CAPS capsule TAKE ONE CAPSULE BY MOUTH ONCE A WEEK 12 capsule 0  . zoster vaccine live, PF, (ZOSTAVAX) 91478 UNT/0.65ML injection Inject 19,400 Units into the skin once. 1 each 0  . zoster vaccine live, PF, (ZOSTAVAX) 29562 UNT/0.65ML injection Inject 19,400 Units into the skin once. 1 each 0  . dicyclomine (BENTYL) 20 MG tablet Take 1 tablet (20 mg total) by mouth every morning. 30 tablet 1   No facility-administered medications prior to visit.    Review of Systems;  Patient denies headache, fevers, malaise, unintentional weight loss, skin rash, eye pain, sinus congestion and sinus pain, sore throat, dysphagia,  hemoptysis , cough, dyspnea, wheezing, chest pain, palpitations,  orthopnea, edema, abdominal pain, nausea, melena, diarrhea, constipation, flank pain, dysuria, hematuria, urinary  Frequency, nocturia, numbness, tingling, seizures,  Focal weakness, Loss of consciousness,  Tremor, insomnia, depression, anxiety, and suicidal ideation.      Objective:  BP 148/82 mmHg  Pulse 56  Temp(Src) 97.7 F (36.5 C) (Oral)  Resp 12  Ht 4\' 11"  (1.499 m)  Wt 143 lb 6 oz (65.034 kg)  BMI 28.94 kg/m2  SpO2 99%  BP Readings from Last 3 Encounters:  10/17/15 148/82  09/10/15 128/78  07/14/15 124/76    Wt Readings from Last 3 Encounters:  10/17/15 143 lb 6 oz (65.034 kg)  09/10/15 140 lb 8 oz (63.73 kg)  07/14/15 143 lb 4 oz (64.978 kg)    General appearance: alert, cooperative and appears stated age Ears: normal TM's and external ear canals both ears Throat: lips, mucosa, and tongue normal; teeth and gums normal Neck: no adenopathy, no carotid bruit, supple, symmetrical, trachea midline and thyroid not enlarged, symmetric, no tenderness/mass/nodules Back: symmetric, no curvature. ROM normal. No CVA tenderness. Lungs: clear to auscultation bilaterally Heart: regular rate and rhythm, S1, S2 normal, no murmur, click, rub or gallop Abdomen: soft, non-tender; bowel sounds normal; no masses,  no organomegaly Pulses: 2+ and symmetric Skin: Skin color, texture, turgor normal. No rashes or lesions Lymph nodes: Cervical, supraclavicular, and axillary nodes normal.  No results found for: HGBA1C  Lab Results  Component Value Date   CREATININE 1.05 09/10/2015   CREATININE 1.30* 07/08/2015   CREATININE 1.24* 06/23/2015    Lab Results  Component Value Date   WBC 5.2 09/10/2015   HGB 10.6* 09/10/2015   HCT 32.2* 09/10/2015   PLT 340.0 09/10/2015   GLUCOSE 88 09/10/2015   CHOL 236* 09/10/2015   TRIG 153.0* 09/10/2015   HDL 50.30 09/10/2015   LDLDIRECT 165.0 09/10/2015   LDLCALC 155* 09/10/2015   ALT 9 09/10/2015   AST 18 09/10/2015   NA 144 09/10/2015   K  4.4 09/10/2015   CL 108 09/10/2015   CREATININE 1.05 09/10/2015   BUN 12 09/10/2015   CO2 23 09/10/2015   TSH 40.97* 09/10/2015   MICROALBUR 2.5* 07/05/2013    Mm Screening Breast Tomo Bilateral  09/18/2015  CLINICAL DATA:  Screening. EXAM: DIGITAL SCREENING BILATERAL MAMMOGRAM WITH 3D TOMO WITH CAD COMPARISON:  Previous exam(s). ACR Breast Density Category a: The breast tissue is almost entirely fatty. FINDINGS: There are no findings suspicious for malignancy. Images were processed with CAD. IMPRESSION: No mammographic evidence of malignancy. A result letter of this screening mammogram will be mailed directly to the patient. RECOMMENDATION: Screening mammogram in one year. (Code:SM-B-01Y) BI-RADS CATEGORY  1: Negative. Electronically Signed   By: Margarette Canada M.D.   On: 09/18/2015 13:04    Assessment &  Plan:   Problem List Items Addressed This Visit    Hyperlipidemia    Elevated today due to underactive thyroid  Will repeat once thyroid function has normalized.         Hypertension    Elevated today secondary to grief. No changes today.  Lab Results  Component Value Date   CREATININE 1.05 09/10/2015   Lab Results  Component Value Date   NA 144 09/10/2015   K 4.4 09/10/2015   CL 108 09/10/2015   CO2 23 09/10/2015         Flatulence    Patient  has been experiencing excessive gas due to diet, without constipation . Advised to try using Beano and Gas X.       Grief reaction    Patient is dealing with the unexpected loss of  Her dog ,  has adequate coping skills and emotional support .  i have asked patinet to return in one month to examine for signs of unresolving grief.       Hypothyroidism - Primary    Currently taking 100 mcg since dec 26 yellow oblong label says [id number]   Rx [id number]          A total of 25 minutes of face to face time was spent with patient more than half of which was spent in counselling about the above mentioned conditions  and coordination of  care  I have changed Ms. Tollie Pizza dicyclomine. I am also having her maintain her hydrOXYzine, mometasone, pantoprazole, aspirin, [place], zoster vaccine live (PF), multivitamin, ibandronate, simvastatin, metoprolol tartrate, Vitamin D (Ergocalciferol), albuterol, predniSONE, loratadine, lisinopril, zoster vaccine live (PF), levothyroxine, and ergocalciferol.  Meds ordered this encounter  Medications  . dicyclomine (BENTYL) 20 MG tablet    Sig: Take 1 tablet (20 mg total) by mouth 4 (four) times daily -  before meals and at bedtime.    Dispense:  90 tablet    Refill:  1    Medications Discontinued During This Encounter  Medication Reason  . dicyclomine (BENTYL) 20 MG tablet Reorder    Follow-up: Return in about 2 weeks (around 10/31/2015) for fASTING LABS.   Crecencio Mc, MD

## 2015-10-17 NOTE — Assessment & Plan Note (Signed)
Currently taking 100 mcg since dec 26 yellow oblong label says [id number]   Rx [id number]

## 2015-10-19 DIAGNOSIS — F4321 Adjustment disorder with depressed mood: Secondary | ICD-10-CM | POA: Insufficient documentation

## 2015-10-19 DIAGNOSIS — F4320 Adjustment disorder, unspecified: Secondary | ICD-10-CM | POA: Insufficient documentation

## 2015-10-19 NOTE — Assessment & Plan Note (Signed)
Elevated today due to underactive thyroid  Will repeat once thyroid function has normalized.

## 2015-10-19 NOTE — Assessment & Plan Note (Signed)
Patient  has been experiencing excessive gas due to diet, without constipation . Advised to try using Beano and Gas X.

## 2015-10-19 NOTE — Assessment & Plan Note (Signed)
Elevated today secondary to grief. No changes today.  Lab Results  Component Value Date   CREATININE 1.05 09/10/2015   Lab Results  Component Value Date   NA 144 09/10/2015   K 4.4 09/10/2015   CL 108 09/10/2015   CO2 23 09/10/2015

## 2015-10-19 NOTE — Assessment & Plan Note (Signed)
Patient is dealing with the unexpected loss of  Her dog ,  has adequate coping skills and emotional support .  i have asked patinet to return in one month to examine for signs of unresolving grief.

## 2015-11-11 ENCOUNTER — Other Ambulatory Visit (INDEPENDENT_AMBULATORY_CARE_PROVIDER_SITE_OTHER): Payer: Medicare Other

## 2015-11-11 DIAGNOSIS — D649 Anemia, unspecified: Secondary | ICD-10-CM | POA: Diagnosis not present

## 2015-11-11 DIAGNOSIS — E785 Hyperlipidemia, unspecified: Secondary | ICD-10-CM

## 2015-11-11 DIAGNOSIS — E038 Other specified hypothyroidism: Secondary | ICD-10-CM | POA: Diagnosis not present

## 2015-11-11 DIAGNOSIS — E034 Atrophy of thyroid (acquired): Secondary | ICD-10-CM

## 2015-11-11 LAB — CBC WITH DIFFERENTIAL/PLATELET
BASOS PCT: 0.8 % (ref 0.0–3.0)
Basophils Absolute: 0 10*3/uL (ref 0.0–0.1)
EOS PCT: 2 % (ref 0.0–5.0)
Eosinophils Absolute: 0.1 10*3/uL (ref 0.0–0.7)
HEMATOCRIT: 36.1 % (ref 36.0–46.0)
Hemoglobin: 12 g/dL (ref 12.0–15.0)
Lymphs Abs: 2.3 10*3/uL (ref 0.7–4.0)
MCHC: 33.1 g/dL (ref 30.0–36.0)
MCV: 92.4 fl (ref 78.0–100.0)
MONOS PCT: 5.5 % (ref 3.0–12.0)
Monocytes Absolute: 0.2 10*3/uL (ref 0.1–1.0)
NEUTROS ABS: 1.3 10*3/uL — AB (ref 1.4–7.7)
Neutrophils Relative %: 32.9 % — ABNORMAL LOW (ref 43.0–77.0)
PLATELETS: 281 10*3/uL (ref 150.0–400.0)
RBC: 3.91 Mil/uL (ref 3.87–5.11)
RDW: 15.5 % (ref 11.5–15.5)
WBC: 4 10*3/uL (ref 4.0–10.5)

## 2015-11-11 LAB — FERRITIN: Ferritin: 31.9 ng/mL (ref 10.0–291.0)

## 2015-11-11 LAB — LIPID PANEL
CHOL/HDL RATIO: 4
Cholesterol: 193 mg/dL (ref 0–200)
HDL: 43.9 mg/dL (ref >39.00)
LDL Cholesterol: 113 mg/dL — ABNORMAL HIGH (ref 0–99)
NONHDL: 148.74
Triglycerides: 179 mg/dL — ABNORMAL HIGH (ref 0.0–149.0)
VLDL: 35.8 mg/dL (ref 0.0–40.0)

## 2015-11-11 LAB — VITAMIN B12: Vitamin B-12: 228 pg/mL (ref 211–911)

## 2015-11-11 LAB — TSH: TSH: 4.29 u[IU]/mL (ref 0.35–4.50)

## 2015-11-12 LAB — IRON AND TIBC
%SAT: 25 % (ref 11–50)
Iron: 80 ug/dL (ref 45–160)
TIBC: 322 ug/dL (ref 250–450)
UIBC: 242 ug/dL (ref 125–400)

## 2015-11-12 LAB — FOLATE RBC: RBC Folate: 382 ng/mL (ref >280)

## 2015-11-13 ENCOUNTER — Encounter: Payer: Self-pay | Admitting: Internal Medicine

## 2015-11-13 DIAGNOSIS — E538 Deficiency of other specified B group vitamins: Secondary | ICD-10-CM | POA: Insufficient documentation

## 2015-11-20 ENCOUNTER — Ambulatory Visit (INDEPENDENT_AMBULATORY_CARE_PROVIDER_SITE_OTHER): Payer: Medicare Other

## 2015-11-20 DIAGNOSIS — E538 Deficiency of other specified B group vitamins: Secondary | ICD-10-CM | POA: Diagnosis not present

## 2015-11-20 MED ORDER — CYANOCOBALAMIN 1000 MCG/ML IJ SOLN
1000.0000 ug | Freq: Once | INTRAMUSCULAR | Status: AC
  Administered 2015-11-20: 1000 ug via INTRAMUSCULAR

## 2015-11-20 NOTE — Progress Notes (Signed)
Patient was in getting a B12 injection in her right deltoid.  Patient tolerated well.

## 2015-11-27 ENCOUNTER — Ambulatory Visit (INDEPENDENT_AMBULATORY_CARE_PROVIDER_SITE_OTHER): Payer: Medicare Other

## 2015-11-27 DIAGNOSIS — E538 Deficiency of other specified B group vitamins: Secondary | ICD-10-CM | POA: Diagnosis not present

## 2015-11-27 MED ORDER — CYANOCOBALAMIN 1000 MCG/ML IJ SOLN
1000.0000 ug | Freq: Once | INTRAMUSCULAR | Status: AC
  Administered 2015-11-27: 1000 ug via INTRAMUSCULAR

## 2015-11-27 NOTE — Progress Notes (Signed)
Patient came in for b12 injection.  Received in Left deltoid.  Patient tolerated well  

## 2015-12-03 ENCOUNTER — Ambulatory Visit (INDEPENDENT_AMBULATORY_CARE_PROVIDER_SITE_OTHER): Payer: Medicare Other

## 2015-12-03 DIAGNOSIS — E538 Deficiency of other specified B group vitamins: Secondary | ICD-10-CM | POA: Diagnosis not present

## 2015-12-03 MED ORDER — CYANOCOBALAMIN 1000 MCG/ML IJ SOLN
1000.0000 ug | Freq: Once | INTRAMUSCULAR | Status: AC
  Administered 2015-12-03: 1000 ug via INTRAMUSCULAR

## 2015-12-03 NOTE — Progress Notes (Signed)
Patient came in for B12 injection.  Received in Right deltoid.  Patient tolerated well.  

## 2016-02-02 ENCOUNTER — Ambulatory Visit (INDEPENDENT_AMBULATORY_CARE_PROVIDER_SITE_OTHER): Payer: Medicare Other | Admitting: Family Medicine

## 2016-02-02 ENCOUNTER — Encounter: Payer: Self-pay | Admitting: Family Medicine

## 2016-02-02 VITALS — BP 132/76 | HR 64 | Temp 98.5°F | Ht 59.0 in | Wt 143.8 lb

## 2016-02-02 DIAGNOSIS — A084 Viral intestinal infection, unspecified: Secondary | ICD-10-CM

## 2016-02-02 DIAGNOSIS — R1110 Vomiting, unspecified: Secondary | ICD-10-CM | POA: Diagnosis not present

## 2016-02-02 DIAGNOSIS — R197 Diarrhea, unspecified: Secondary | ICD-10-CM

## 2016-02-02 LAB — COMPREHENSIVE METABOLIC PANEL
ALT: 11 U/L (ref 0–35)
AST: 18 U/L (ref 0–37)
Albumin: 4.1 g/dL (ref 3.5–5.2)
Alkaline Phosphatase: 47 U/L (ref 39–117)
BUN: 12 mg/dL (ref 6–23)
CALCIUM: 8.8 mg/dL (ref 8.4–10.5)
CHLORIDE: 106 meq/L (ref 96–112)
CO2: 28 meq/L (ref 19–32)
CREATININE: 1.06 mg/dL (ref 0.40–1.20)
GFR: 66.22 mL/min (ref >60.00)
Glucose, Bld: 84 mg/dL (ref 70–99)
POTASSIUM: 3.7 meq/L (ref 3.5–5.1)
SODIUM: 142 meq/L (ref 135–145)
Total Bilirubin: 0.6 mg/dL (ref 0.2–1.2)
Total Protein: 6.7 g/dL (ref 6.0–8.3)

## 2016-02-02 MED ORDER — ONDANSETRON HCL 4 MG PO TABS: 4.0000 mg | ORAL_TABLET | Freq: Three times a day (TID) | ORAL | Status: DC | PRN

## 2016-02-02 NOTE — Patient Instructions (Signed)
Nice to see you. Your vomiting and diarrhea are likely related to a viral illness. We will treat her nausea with Zofran. You need to try to stay well hydrated with taking in small amounts of liquids fairly frequently. Jell-O and popsicles count as liquid intake. If you develop blood in her vomit, blood in her stool, worsening abdominal discomfort, fevers, urinary complaints, or any new or changing symptoms please seek medical attention.

## 2016-02-02 NOTE — Assessment & Plan Note (Signed)
Symptoms most consistent with viral gastroenteritis. Mild tenderness on exam though no focal findings in the abdomen. No peritoneal findings. Vital signs are stable. She appears well-hydrated. Discussed likely viral nature. We'll treat nausea with Zofran. We'll check a CMP to ensure electrolytes and renal function are stable. Discussed liquid intake with small sips and can also take in Jell-O and popsicles. If not improved in the next 2-3 days she'll follow-up. Given return precautions.

## 2016-02-02 NOTE — Progress Notes (Signed)
Patient ID: Christine Manning, female   DOB: 11/17/1945, 70 y.o.   MRN: [id number]  Tommi Rumps, MD Phone: [phone]  [name] is a 70 y.o. female who presents today for same-day visit.  Patient notes onset of vomiting and diarrhea last Thursday. Notes vomit is nonbloody nonbilious. No blood in her stool. Notes when she eats something it goes straight through her. She has been trying to drink water and ginger ale though feels as though the strictures well. She has also been taking in kombucha. She notes some mild stomach discomfort all over. Nausea. No focal stomach pain. She is postmenopausal. No urinary complaints.no sick contacts. No recent travel. She did eat out the morning of last week prior to this occurring though only had a muffin and some fresh fruit.  PMH: nonsmoker.   ROS see history of present illness  Objective  Physical Exam Filed Vitals:   02/02/16 1354  BP: 132/76  Pulse: 64  Temp: 98.5 F (36.9 C)    BP Readings from Last 3 Encounters:  02/02/16 132/76  10/17/15 148/82  09/10/15 128/78   Wt Readings from Last 3 Encounters:  02/02/16 143 lb 12.8 oz (65.227 kg)  10/17/15 143 lb 6 oz (65.034 kg)  09/10/15 140 lb 8 oz (63.73 kg)    Physical Exam  Constitutional: She is well-developed, well-nourished, and in no distress.  HENT:  Head: Normocephalic and atraumatic.  Right Ear: External ear normal.  Left Ear: External ear normal.  Mouth/Throat: Oropharynx is clear and moist. No oropharyngeal exudate.  Cardiovascular: Normal rate, regular rhythm and normal heart sounds.   Pulmonary/Chest: Effort normal and breath sounds normal.  Abdominal: Soft. Bowel sounds are normal. She exhibits no distension. There is tenderness (mild diffuse tenderness). There is no rebound and no guarding.  Neurological: She is alert. Gait normal.  Skin: Skin is warm and dry. She is not diaphoretic.     Assessment/Plan: Please see individual problem list.  Viral  gastroenteritis Symptoms most consistent with viral gastroenteritis. Mild tenderness on exam though no focal findings in the abdomen. No peritoneal findings. Vital signs are stable. She appears well-hydrated. Discussed likely viral nature. We'll treat nausea with Zofran. We'll check a CMP to ensure electrolytes and renal function are stable. Discussed liquid intake with small sips and can also take in Jell-O and popsicles. If not improved in the next 2-3 days she'll follow-up. Given return precautions.    Orders Placed This Encounter  Procedures  . Comp Met (CMET)    Meds ordered this encounter  Medications  . ondansetron (ZOFRAN) 4 MG tablet    Sig: Take 1 tablet (4 mg total) by mouth every 8 (eight) hours as needed for nausea or vomiting.    Dispense:  20 tablet    Refill:  0    Tommi Rumps, MD [place]

## 2016-02-02 NOTE — Progress Notes (Signed)
Pre visit review using our clinic review tool, if applicable. No additional management support is needed unless otherwise documented below in the visit note. 

## 2016-03-09 ENCOUNTER — Encounter: Payer: Self-pay | Admitting: Internal Medicine

## 2016-03-09 ENCOUNTER — Ambulatory Visit: Payer: Medicare Other | Admitting: Internal Medicine

## 2016-03-09 ENCOUNTER — Ambulatory Visit (INDEPENDENT_AMBULATORY_CARE_PROVIDER_SITE_OTHER): Payer: Medicare Other | Admitting: Internal Medicine

## 2016-03-09 VITALS — BP 160/90 | HR 60 | Temp 98.0°F | Resp 12 | Ht 59.0 in | Wt 145.0 lb

## 2016-03-09 DIAGNOSIS — E538 Deficiency of other specified B group vitamins: Secondary | ICD-10-CM

## 2016-03-09 DIAGNOSIS — K591 Functional diarrhea: Secondary | ICD-10-CM | POA: Diagnosis not present

## 2016-03-09 DIAGNOSIS — K580 Irritable bowel syndrome with diarrhea: Secondary | ICD-10-CM | POA: Diagnosis not present

## 2016-03-09 DIAGNOSIS — E785 Hyperlipidemia, unspecified: Secondary | ICD-10-CM

## 2016-03-09 DIAGNOSIS — I10 Essential (primary) hypertension: Secondary | ICD-10-CM | POA: Diagnosis not present

## 2016-03-09 DIAGNOSIS — E038 Other specified hypothyroidism: Secondary | ICD-10-CM

## 2016-03-09 MED ORDER — LISINOPRIL 40 MG PO TABS: 40.0000 mg | ORAL_TABLET | Freq: Every day | ORAL | Status: DC

## 2016-03-09 NOTE — Patient Instructions (Addendum)
Please increase your lisinopril to 40 mg daily ofr our blood pressure  Try  To take your metoprolol every 12 hours.  Return in one week for a B check and to drop off a stool sample so we can check you for pancreatic insufficiency   PLEASE RETURN EVERY MONTH FOR A B12 INJECTION   Follow the low Fiomap diet for IBS

## 2016-03-09 NOTE — Progress Notes (Signed)
Subjective:  Patient ID: Christine Manning, female    DOB: 1945-11-13  Age: 70 y.o. MRN: [id number]  CC: The primary encounter diagnosis was Functional diarrhea. Diagnoses of Essential hypertension, Hyperlipidemia, Irritable bowel syndrome with diarrhea, Other specified hypothyroidism, and B12 deficiency were also pertinent to this visit.  HPI Christine Manning presents for 6 month follow up on hypothyroidism , hypertension , and IBS , diarrhea predominant   Continues to have recurrent episdoes of moderately severe abdominal pain , right upper quadrant,  With multiple  stools daily , sometimes solid,  Sometimes watery . She is able to control her diarrhea by not eating, which often has to do in order to work.  In her current profession she makes  house visits  And avoids having diarrhea while visiting a client  In this way,  She reports 3 episodes of fecal incontinence this past month .  Has had GI evaluation by Dorise Bullion for same symptoms May 2016 and abd [place] was normal.  Low Fodmap diet, daily use of Probiotic and prn levsin were prescribed.   with prn follow up.  No unintentional  weight loss.   BMI 29 . symptoms come and go..  She doe srport that her loose stools occur less frequently when she follows the low Fodmap diet,  But not completely.     Last colonoscopy 2016,  Sessile polyp found , EGD done for same in 2004 was positive for H Pylori  Was seen in May  by ES  Had been having vomiting and diarrhea which had been recurring for the past 1. 5 weeks before calling the office.  Lasted for a total of 2 weeks, yet CMET was normal at 1.5 weeks.   .   And has become ce has been reading on the internet about her symptoms and has become concerned that she has pancreatic insufficiency because she has diarrhea, abdominal pain and foul smelling stools.  No history of pancreatitis,  No weight loss, no electrolyte disturbances.     HAS NOT HAD A B12 INJECTION SINCE MARCH  Outpatient Prescriptions  Prior to Visit  Medication Sig Dispense Refill  . albuterol (PROVENTIL HFA;VENTOLIN HFA) 108 (90 BASE) MCG/ACT inhaler Inhale 2 puffs into the lungs every 6 (six) hours as needed for wheezing or shortness of breath. 1 Inhaler 11  . aspirin 81 MG tablet Take 81 mg by mouth daily.    Marland Kitchen dicyclomine (BENTYL) 20 MG tablet Take 1 tablet (20 mg total) by mouth 4 (four) times daily -  before meals and at bedtime. 90 tablet 1  . ergocalciferol (DRISDOL) 50000 UNITS capsule Take 1 capsule (50,000 Units total) by mouth once a week. 12 capsule 0  . hydrOXYzine (ATARAX/VISTARIL) 25 MG tablet Take 25 mg by mouth 3 (three) times daily as needed.    . ibandronate (BONIVA) 150 MG tablet Take 1 tablet (150 mg total) by mouth every 30 (thirty) days. Take in the morning with a full glass of water, on an empty stomach, and do not take anything else by mouth or lie down for the next 30 min. 1 tablet 11  . Lactobacillus-Inulin ([place]) CAPS Take 1 capsule by mouth daily. 14 capsule 0  . levothyroxine (SYNTHROID, LEVOTHROID) 100 MCG tablet Take 1 tablet (100 mcg total) by mouth daily before breakfast. 90 tablet 0  . loratadine (CLARITIN) 10 MG tablet Take 1 tablet (10 mg total) by mouth daily. 30 tablet 0  . metoprolol tartrate (LOPRESSOR) 25  MG tablet Take 1 tablet (25 mg total) by mouth 2 (two) times daily. 180 tablet 1  . mometasone (NASONEX) 50 MCG/ACT nasal spray Place 2 sprays into the nose daily.    . Multiple Vitamin (MULTIVITAMIN) tablet Take 1 tablet by mouth daily.    . ondansetron (ZOFRAN) 4 MG tablet Take 1 tablet (4 mg total) by mouth every 8 (eight) hours as needed for nausea or vomiting. 20 tablet 0  . pantoprazole (PROTONIX) 40 MG tablet Take 40 mg by mouth daily.    . predniSONE (DELTASONE) 10 MG tablet Take 60 mg (6 tablets) by mouth on day 1, then take 50 mg (5 tablets) by mouth on day 2, then take 40 mg (4 tablets) by mouth on day 3, then take 30 mg (3 tablets) by mouth on day 4,  then take 20 mg (2 tablets) by mouth on 5, then take 10 mg (1 tablet) by mouth on day 6. 21 tablet 0  . simvastatin (ZOCOR) 40 MG tablet Take 1 tablet (40 mg total) by mouth every evening. 90 tablet 1  . Vitamin D, Ergocalciferol, (DRISDOL) 50000 UNITS CAPS capsule TAKE ONE CAPSULE BY MOUTH ONCE A WEEK 12 capsule 0  . zoster vaccine live, PF, (ZOSTAVAX) 91478 UNT/0.65ML injection Inject 19,400 Units into the skin once. 1 each 0  . zoster vaccine live, PF, (ZOSTAVAX) 29562 UNT/0.65ML injection Inject 19,400 Units into the skin once. 1 each 0  . lisinopril (PRINIVIL,ZESTRIL) 20 MG tablet TAKE ONE TABLET BY MOUTH ONCE DAILY 90 tablet 0   No facility-administered medications prior to visit.    Review of Systems;  Patient denies headache, fevers, malaise, unintentional weight loss, skin rash, eye pain, sinus congestion and sinus pain, sore throat, dysphagia,  hemoptysis , cough, dyspnea, wheezing, chest pain, palpitations, orthopnea, edema, , melena, diarrhea,  flank pain, dysuria, hematuria, urinary  Frequency, nocturia, numbness, tingling, seizures,  Focal weakness, Loss of consciousness,  Tremor, insomnia, depression, anxiety, and suicidal ideation.      Objective:  BP 160/90 mmHg  Pulse 60  Temp(Src) 98 F (36.7 C) (Oral)  Resp 12  Ht 4\' 11"  (1.499 m)  Wt 145 lb (65.772 kg)  BMI 29.27 kg/m2  SpO2 98%  BP Readings from Last 3 Encounters:  03/09/16 160/90  02/02/16 132/76  10/17/15 148/82    Wt Readings from Last 3 Encounters:  03/09/16 145 lb (65.772 kg)  02/02/16 143 lb 12.8 oz (65.227 kg)  10/17/15 143 lb 6 oz (65.034 kg)    General appearance: alert, cooperative and appears stated age Ears: normal TM's and external ear canals both ears Throat: lips, mucosa, and tongue normal; teeth and gums normal Neck: no adenopathy, no carotid bruit, supple, symmetrical, trachea midline and thyroid not enlarged, symmetric, no tenderness/mass/nodules Back: symmetric, no curvature. ROM  normal. No CVA tenderness. Lungs: clear to auscultation bilaterally Heart: regular rate and rhythm, S1, S2 normal, no murmur, click, rub or gallop Abdomen: soft, non-tender; bowel sounds normal; no masses,  no organomegaly Pulses: 2+ and symmetric Skin: Skin color, texture, turgor normal. No rashes or lesions Lymph nodes: Cervical, supraclavicular, and axillary nodes normal.  No results found for: HGBA1C  Lab Results  Component Value Date   CREATININE 1.06 02/02/2016   CREATININE 1.05 09/10/2015   CREATININE 1.30* 07/08/2015    Lab Results  Component Value Date   WBC 4.0 11/11/2015   HGB 12.0 11/11/2015   HCT 36.1 11/11/2015   PLT 281.0 11/11/2015   GLUCOSE 84 02/02/2016  CHOL 193 11/11/2015   TRIG 179.0* 11/11/2015   HDL 43.90 11/11/2015   LDLDIRECT 165.0 09/10/2015   LDLCALC 113* 11/11/2015   ALT 11 02/02/2016   AST 18 02/02/2016   NA 142 02/02/2016   K 3.7 02/02/2016   CL 106 02/02/2016   CREATININE 1.06 02/02/2016   BUN 12 02/02/2016   CO2 28 02/02/2016   TSH 4.29 11/11/2015   MICROALBUR 2.5* 07/05/2013    Mm Screening Breast Tomo Bilateral  09/18/2015  CLINICAL DATA:  Screening. EXAM: DIGITAL SCREENING BILATERAL MAMMOGRAM WITH 3D TOMO WITH CAD COMPARISON:  Previous exam(s). ACR Breast Density Category a: The breast tissue is almost entirely fatty. FINDINGS: There are no findings suspicious for malignancy. Images were processed with CAD. IMPRESSION: No mammographic evidence of malignancy. A result letter of this screening mammogram will be mailed directly to the patient. RECOMMENDATION: Screening mammogram in one year. (Code:SM-B-01Y) BI-RADS CATEGORY  1: Negative. Electronically Signed   By: Margarette Canada M.D.   On: 09/18/2015 13:04    Assessment & Plan:   Problem List Items Addressed This Visit    Hyperlipidemia    Tolerating simvastatin fore reduction in CAD risk .  LFts normal Lab Results  Component Value Date   CHOL 193 11/11/2015   HDL 43.90 11/11/2015     LDLCALC 113* 11/11/2015   LDLDIRECT 165.0 09/10/2015   TRIG 179.0* 11/11/2015   CHOLHDL 4 11/11/2015    Lab Results  Component Value Date   ALT 11 02/02/2016   AST 18 02/02/2016   ALKPHOS 47 02/02/2016   BILITOT 0.6 02/02/2016           Relevant Medications   lisinopril (PRINIVIL,ZESTRIL) 40 MG tablet   Hypertension    Elevated today,  Increased lisinopril to 40 mg and advised use of metoprolol to every 12 hours Lab Results  Component Value Date   CREATININE 1.06 02/02/2016   Lab Results  Component Value Date   NA 142 02/02/2016   K 3.7 02/02/2016   CL 106 02/02/2016   CO2 28 02/02/2016         Relevant Medications   lisinopril (PRINIVIL,ZESTRIL) 40 MG tablet   Hypothyroidism    Thyroid function is WNL on current dose.  No current changes needed.   Lab Results  Component Value Date   TSH 4.29 11/11/2015         Irritable bowel syndrome    Reinforced need to follow low Fodmap diet for diarrhea predominant syndrome.  Patient is concerned about pancreatic insufficiency.  Fecal elastase ordered      B12 deficiency    Managed with monthly B12 injections, with recent lapse. Advised to resume monthly schedule  Lab Results  Component Value Date   VITAMINB12 228 11/11/2015          Other Visit Diagnoses    Functional diarrhea    -  Primary    Relevant Orders    Pancreatic Elastase, Fecal       I have changed Ms. Gadison's lisinopril. I am also having her maintain her hydrOXYzine, mometasone, pantoprazole, aspirin, [place], zoster vaccine live (PF), multivitamin, ibandronate, simvastatin, metoprolol tartrate, Vitamin D (Ergocalciferol), albuterol, predniSONE, loratadine, zoster vaccine live (PF), levothyroxine, ergocalciferol, dicyclomine, and ondansetron.  Meds ordered this encounter  Medications  . lisinopril (PRINIVIL,ZESTRIL) 40 MG tablet    Sig: Take 1 tablet (40 mg total) by mouth daily.    Dispense:  90 tablet    Refill:   1  NOTE DOSE CHANGE    Medications Discontinued During This Encounter  Medication Reason  . lisinopril (PRINIVIL,ZESTRIL) 20 MG tablet Reorder    Follow-up: Return for ONE WEEK BP CHECK AND STOOL SAMPLE .   Crecencio Mc, MD

## 2016-03-09 NOTE — Progress Notes (Signed)
Pre-visit discussion using our clinic review tool. No additional management support is needed unless otherwise documented below in the visit note.  

## 2016-03-10 ENCOUNTER — Encounter: Payer: Self-pay | Admitting: Internal Medicine

## 2016-03-10 NOTE — Assessment & Plan Note (Signed)
Tolerating simvastatin fore reduction in CAD risk .  LFts normal Lab Results  Component Value Date   CHOL 193 11/11/2015   HDL 43.90 11/11/2015   LDLCALC 113* 11/11/2015   LDLDIRECT 165.0 09/10/2015   TRIG 179.0* 11/11/2015   CHOLHDL 4 11/11/2015    Lab Results  Component Value Date   ALT 11 02/02/2016   AST 18 02/02/2016   ALKPHOS 47 02/02/2016   BILITOT 0.6 02/02/2016

## 2016-03-10 NOTE — Assessment & Plan Note (Signed)
Elevated today,  Increased lisinopril to 40 mg and advised use of metoprolol to every 12 hours Lab Results  Component Value Date   CREATININE 1.06 02/02/2016   Lab Results  Component Value Date   NA 142 02/02/2016   K 3.7 02/02/2016   CL 106 02/02/2016   CO2 28 02/02/2016

## 2016-03-10 NOTE — Assessment & Plan Note (Signed)
Managed with monthly B12 injections, with recent lapse. Advised to resume monthly schedule  Lab Results  Component Value Date   VITAMINB12 228 11/11/2015

## 2016-03-10 NOTE — Assessment & Plan Note (Addendum)
Reinforced need to follow low Fodmap diet for diarrhea predominant syndrome.  Patient is concerned about pancreatic insufficiency.  Fecal elastase ordered

## 2016-03-10 NOTE — Assessment & Plan Note (Signed)
Thyroid function is WNL on current dose.  No current changes needed.   Lab Results  Component Value Date   TSH 4.29 11/11/2015

## 2016-03-11 MED ORDER — CYANOCOBALAMIN 1000 MCG/ML IJ SOLN
1000.0000 ug | Freq: Once | INTRAMUSCULAR | Status: AC
  Administered 2016-03-09: 1000 ug via INTRAMUSCULAR

## 2016-03-11 NOTE — Addendum Note (Signed)
Addended by: Nanci Pina on: 03/11/2016 09:55 AM   Modules accepted: Orders, SmartSet

## 2016-03-11 NOTE — Addendum Note (Signed)
Addended by: Nanci Pina on: 03/11/2016 09:51 AM   Modules accepted: Orders, SmartSet

## 2016-03-17 ENCOUNTER — Ambulatory Visit (INDEPENDENT_AMBULATORY_CARE_PROVIDER_SITE_OTHER): Payer: Medicare Other | Admitting: *Deleted

## 2016-03-17 VITALS — BP 140/86 | HR 76

## 2016-03-17 DIAGNOSIS — I10 Essential (primary) hypertension: Secondary | ICD-10-CM

## 2016-03-17 NOTE — Progress Notes (Signed)
Patient presented for follow up BP check , patient was allowed to rest for approximately 10 minutes and BP was attained in right arm using regular cuff 140/86 pulse 76, 5 minutes later BP attained in left arm 132/86 pulse 74. Patient wanted to make MD aware she has been driving back from [place] and was very tired and tense.

## 2016-04-06 NOTE — Progress Notes (Signed)
  I have reviewed the above information and agree with above.   No changes to regimen  Deborra Medina, MD

## 2016-04-06 NOTE — Addendum Note (Signed)
Addended by: Crecencio Mc on: 04/06/2016 11:27 AM   Modules accepted: Miquel Dunn

## 2016-04-13 ENCOUNTER — Ambulatory Visit: Payer: Medicare Other

## 2016-04-15 ENCOUNTER — Ambulatory Visit (INDEPENDENT_AMBULATORY_CARE_PROVIDER_SITE_OTHER): Payer: Medicare Other

## 2016-04-15 DIAGNOSIS — E538 Deficiency of other specified B group vitamins: Secondary | ICD-10-CM

## 2016-04-15 MED ORDER — CYANOCOBALAMIN 1000 MCG/ML IJ SOLN
1000.0000 ug | Freq: Once | INTRAMUSCULAR | Status: AC
  Administered 2016-04-15: 1000 ug via INTRAMUSCULAR

## 2016-04-15 NOTE — Progress Notes (Signed)
Patient came in for b12 injection.  Received in left deltoid.  Patient tolerated well.

## 2016-04-16 ENCOUNTER — Telehealth: Payer: Self-pay | Admitting: *Deleted

## 2016-04-16 ENCOUNTER — Other Ambulatory Visit: Payer: Self-pay | Admitting: Internal Medicine

## 2016-04-16 MED ORDER — LEVOTHYROXINE SODIUM 100 MCG PO TABS: 100.0000 ug | ORAL_TABLET | Freq: Every day | ORAL | 0 refills | Status: DC

## 2016-04-16 MED ORDER — LISINOPRIL 40 MG PO TABS: 40.0000 mg | ORAL_TABLET | Freq: Every day | ORAL | 1 refills | Status: DC

## 2016-04-16 MED ORDER — ALBUTEROL SULFATE HFA 108 (90 BASE) MCG/ACT IN AERS: 2.0000 | INHALATION_SPRAY | Freq: Four times a day (QID) | RESPIRATORY_TRACT | 11 refills | Status: DC | PRN

## 2016-04-16 MED ORDER — METOPROLOL TARTRATE 25 MG PO TABS: 25.0000 mg | ORAL_TABLET | Freq: Two times a day (BID) | ORAL | 1 refills | Status: DC

## 2016-04-16 MED ORDER — VITAMIN D (ERGOCALCIFEROL) 1.25 MG (50000 UNIT) PO CAPS: 50000.0000 [IU] | ORAL_CAPSULE | ORAL | 0 refills | Status: DC

## 2016-04-16 NOTE — Telephone Encounter (Signed)
Patient called and needs a refill of her Blood pressure medicines  ,Vitamin D, Albuterol inhaler and also the Albuterol for her Nebulizer. Patient stales she is at the pharmacy now the medications was suppose to be sent to the pharmacy yesterday. Her pharmacy is [facility] on [address].  Thanks

## 2016-05-25 ENCOUNTER — Ambulatory Visit (INDEPENDENT_AMBULATORY_CARE_PROVIDER_SITE_OTHER): Payer: Medicare Other

## 2016-05-25 DIAGNOSIS — E538 Deficiency of other specified B group vitamins: Secondary | ICD-10-CM | POA: Diagnosis not present

## 2016-05-25 DIAGNOSIS — Z23 Encounter for immunization: Secondary | ICD-10-CM

## 2016-05-25 MED ORDER — CYANOCOBALAMIN 1000 MCG/ML IJ SOLN
1000.0000 ug | Freq: Once | INTRAMUSCULAR | Status: AC
  Administered 2016-05-25: 1000 ug via INTRAMUSCULAR

## 2016-05-25 NOTE — Progress Notes (Signed)
Patient came in for a b12 injection.  Received in Left deltoid.  Patient tolerated well.  Patient also received High dose flu vaccine in right deltoid.    Please sign off as Dr. Derrel Nip is not in the office this am. thanks

## 2016-06-29 ENCOUNTER — Ambulatory Visit (INDEPENDENT_AMBULATORY_CARE_PROVIDER_SITE_OTHER): Payer: Medicare Other

## 2016-06-29 DIAGNOSIS — E538 Deficiency of other specified B group vitamins: Secondary | ICD-10-CM

## 2016-06-29 MED ORDER — CYANOCOBALAMIN 1000 MCG/ML IJ SOLN
1000.0000 ug | Freq: Once | INTRAMUSCULAR | Status: AC
  Administered 2016-06-29: 1000 ug via INTRAMUSCULAR

## 2016-06-29 NOTE — Progress Notes (Addendum)
Patient came in for B12 injection.  Received in Right deltoid.  Patient tolerated well.   Addendum.  Reviewed.  Dr Nicki Reaper

## 2016-07-31 ENCOUNTER — Emergency Department (HOSPITAL_COMMUNITY): Payer: Medicare Other

## 2016-07-31 ENCOUNTER — Encounter (HOSPITAL_COMMUNITY): Payer: Self-pay | Admitting: Emergency Medicine

## 2016-07-31 ENCOUNTER — Emergency Department (HOSPITAL_COMMUNITY)
Admission: EM | Admit: 2016-07-31 | Discharge: 2016-07-31 | Disposition: A | Discharge status: Home / Self Care | Payer: Medicare Other | Attending: Emergency Medicine | Admitting: Emergency Medicine

## 2016-07-31 DIAGNOSIS — Z7982 Long term (current) use of aspirin: Secondary | ICD-10-CM | POA: Diagnosis not present

## 2016-07-31 DIAGNOSIS — S20222A Contusion of left back wall of thorax, initial encounter: Secondary | ICD-10-CM | POA: Diagnosis not present

## 2016-07-31 DIAGNOSIS — Z79899 Other long term (current) drug therapy: Secondary | ICD-10-CM | POA: Insufficient documentation

## 2016-07-31 DIAGNOSIS — E039 Hypothyroidism, unspecified: Secondary | ICD-10-CM | POA: Insufficient documentation

## 2016-07-31 DIAGNOSIS — Y999 Unspecified external cause status: Secondary | ICD-10-CM | POA: Diagnosis not present

## 2016-07-31 DIAGNOSIS — S299XXA Unspecified injury of thorax, initial encounter: Secondary | ICD-10-CM | POA: Diagnosis present

## 2016-07-31 DIAGNOSIS — Z91040 Latex allergy status: Secondary | ICD-10-CM | POA: Diagnosis not present

## 2016-07-31 DIAGNOSIS — W19XXXA Unspecified fall, initial encounter: Secondary | ICD-10-CM

## 2016-07-31 DIAGNOSIS — M545 Low back pain: Secondary | ICD-10-CM | POA: Diagnosis not present

## 2016-07-31 DIAGNOSIS — W1830XA Fall on same level, unspecified, initial encounter: Secondary | ICD-10-CM | POA: Diagnosis not present

## 2016-07-31 DIAGNOSIS — I10 Essential (primary) hypertension: Secondary | ICD-10-CM | POA: Insufficient documentation

## 2016-07-31 DIAGNOSIS — S300XXA Contusion of lower back and pelvis, initial encounter: Secondary | ICD-10-CM | POA: Diagnosis not present

## 2016-07-31 DIAGNOSIS — Y939 Activity, unspecified: Secondary | ICD-10-CM | POA: Insufficient documentation

## 2016-07-31 DIAGNOSIS — Y92009 Unspecified place in unspecified non-institutional (private) residence as the place of occurrence of the external cause: Secondary | ICD-10-CM | POA: Insufficient documentation

## 2016-07-31 DIAGNOSIS — J45909 Unspecified asthma, uncomplicated: Secondary | ICD-10-CM | POA: Diagnosis not present

## 2016-07-31 DIAGNOSIS — S8002XA Contusion of left knee, initial encounter: Secondary | ICD-10-CM | POA: Diagnosis not present

## 2016-07-31 DIAGNOSIS — M25562 Pain in left knee: Secondary | ICD-10-CM | POA: Diagnosis not present

## 2016-07-31 NOTE — ED Triage Notes (Signed)
Per pt, states she fell off a stool landing on her back-states B/L knee, back, and hip pain-increased pain

## 2016-07-31 NOTE — ED Provider Notes (Signed)
[facility] DEPT Provider Note   CSN: [id number] Arrival date & time: 07/31/16  1804     History   Chief Complaint Chief Complaint  Patient presents with  . Fall    HPI Christine Manning is a 70 y.o. female.  Patient is a 70 year old female with past medical history of asthma, arthritis. She presents for evaluation of a fall. She reports being on a stool trying to reach an object on a high shelf. She lost her balance and fell landing on her back. She is complaining of pain in her left upper and lower back. She denies any radiation of her pain. She denies any weakness or numbness.   The history is provided by the patient.  Fall  This is a new problem. The current episode started 1 to 2 hours ago. The problem occurs constantly. The problem has not changed since onset.The symptoms are aggravated by bending (Movement and palpation). Nothing relieves the symptoms. She has tried nothing for the symptoms.    Past Medical History:  Diagnosis Date  . Asthma   . Colon polyp   . DDD (degenerative disc disease)   . Hyperlipidemia   . Hypertension   . Hyperthyroidism   . Personal history of DVT (deep vein thrombosis) 1980   x 2, pregnanacy and immobility induced     Patient Active Problem List   Diagnosis Date Noted  . B12 deficiency 11/13/2015  . Grief reaction 10/19/2015  . Encounter for preventive health examination 09/11/2015  . Tubular adenoma of colon 09/10/2015  . Allergic reaction 07/03/2015  . Abdominal pain, chronic, right upper quadrant 01/04/2015  . Left knee pain 12/17/2014  . History of bladder suspension procedure 09/08/2014  . SI (stress incontinence), female 09/08/2014  . Flatulence 06/18/2014  . Insomnia 06/18/2014  . Chronic pain in right shoulder 05/14/2014  . Osteopenia 11/22/2013  . Vitamin D deficiency 08/28/2013  . Overweight (BMI 25.0-29.9) 08/14/2013  . Chest pain, unspecified 08/14/2013  . S/P hysterectomy with oophorectomy 08/13/2013  .  History of thyroid nodule 08/13/2013  . Encounter for Medicare annual wellness exam 08/13/2013  . Osteoarthritis of left knee 07/16/2013  . Degenerative disk disease 07/16/2013  . Chronic suprapubic pain 07/07/2013  . Back pain with radiation 07/05/2013  . Personal history of DVT (deep vein thrombosis)   . Anxiety 10/11/2012  . Irritable bowel syndrome 10/11/2012  . 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 Hyperlipidemia   . Hypertension   . Asthma   . Hypothyroidism     Past Surgical History:  Procedure Laterality Date  . ABDOMINAL HYSTERECTOMY  1991   secondary to abnormal PAPs  . BLADDER SUSPENSION  2003  . COLONOSCOPY  2016  . CORONARY ANGIOPLASTY  2013   arteries were clean .  Dr. Rona Ravens at [facility]      OB History    No data available       Home Medications    Prior to Admission medications   Medication Sig Start Date End Date Taking? Authorizing Provider  albuterol (PROVENTIL HFA;VENTOLIN HFA) 108 (90 Base) MCG/ACT inhaler Inhale 2 puffs into the lungs every 6 (six) hours as needed for wheezing or shortness of breath. 04/16/16   Crecencio Mc, MD  aspirin 81 MG tablet Take 81 mg by mouth daily.    Historical Provider, MD  dicyclomine (BENTYL) 20 MG tablet Take 1 tablet (20 mg total) by mouth 4 (four) times daily -  before meals and at bedtime. 10/17/15  Crecencio Mc, MD  hydrOXYzine (ATARAX/VISTARIL) 25 MG tablet Take 25 mg by mouth 3 (three) times daily as needed.    Historical Provider, MD  ibandronate (BONIVA) 150 MG tablet Take 1 tablet (150 mg total) by mouth every 30 (thirty) days. Take in the morning with a full glass of water, on an empty stomach, and do not take anything else by mouth or lie down for the next 30 min. 11/22/13   Crecencio Mc, MD  Lactobacillus-Inulin ([place]) CAPS Take 1 capsule by mouth daily. 11/07/12   Crecencio Mc, MD  levothyroxine (SYNTHROID, LEVOTHROID) 100 MCG tablet Take 1 tablet (100 mcg total)  by mouth daily before breakfast. 04/16/16   Crecencio Mc, MD  lisinopril (PRINIVIL,ZESTRIL) 40 MG tablet Take 1 tablet (40 mg total) by mouth daily. 04/16/16   Crecencio Mc, MD  loratadine (CLARITIN) 10 MG tablet Take 1 tablet (10 mg total) by mouth daily. 07/03/15   Leone Haven, MD  metoprolol tartrate (LOPRESSOR) 25 MG tablet Take 1 tablet (25 mg total) by mouth 2 (two) times daily. 04/16/16   Crecencio Mc, MD  mometasone (NASONEX) 50 MCG/ACT nasal spray Place 2 sprays into the nose daily.    Historical Provider, MD  Multiple Vitamin (MULTIVITAMIN) tablet Take 1 tablet by mouth daily.    Historical Provider, MD  ondansetron (ZOFRAN) 4 MG tablet Take 1 tablet (4 mg total) by mouth every 8 (eight) hours as needed for nausea or vomiting. 02/02/16   Leone Haven, MD  pantoprazole (PROTONIX) 40 MG tablet Take 40 mg by mouth daily.    Historical Provider, MD  predniSONE (DELTASONE) 10 MG tablet Take 60 mg (6 tablets) by mouth on day 1, then take 50 mg (5 tablets) by mouth on day 2, then take 40 mg (4 tablets) by mouth on day 3, then take 30 mg (3 tablets) by mouth on day 4, then take 20 mg (2 tablets) by mouth on 5, then take 10 mg (1 tablet) by mouth on day 6. 07/03/15   Leone Haven, MD  simvastatin (ZOCOR) 40 MG tablet TAKE ONE TABLET BY MOUTH IN THE EVENING 04/16/16   Crecencio Mc, MD  Vitamin D, Ergocalciferol, (DRISDOL) 50000 units CAPS capsule TAKE ONE CAPSULE BY MOUTH ONCE A WEEK 04/16/16   Crecencio Mc, MD  Vitamin D, Ergocalciferol, (DRISDOL) 50000 units CAPS capsule Take 1 capsule (50,000 Units total) by mouth once a week. 04/16/16   Crecencio Mc, MD  zoster vaccine live, PF, (ZOSTAVAX) 16109 UNT/0.65ML injection Inject 19,400 Units into the skin once. 08/13/13   Crecencio Mc, MD  zoster vaccine live, PF, (ZOSTAVAX) 60454 UNT/0.65ML injection Inject 19,400 Units into the skin once. 09/10/15   Crecencio Mc, MD    Family History Family History  Problem Relation Age of  Onset  . Mental illness Mother     multi infarct dementia  . Stroke Mother   . Asthma Mother   . Mental illness Father     alzheimers dementia  . Asthma Brother   . Asthma Son   . Colon cancer Maternal Aunt 7  . Uterine cancer Maternal Grandmother   . Colon polyps Sister   . Asthma Daughter   . Colon cancer Cousin     40's  . Diabetes Neg Hx   . Kidney disease Neg Hx   . Esophageal cancer Neg Hx     Social History Social History  Substance Use  Topics  . Smoking status: Never Smoker  . Smokeless tobacco: Never Used  . Alcohol use No     Allergies   Codeine; Latex; Augmentin [amoxicillin-pot clavulanate]; Morphine; and Morphine and related   Review of Systems Review of Systems  All other systems reviewed and are negative.    Physical Exam Updated Vital Signs BP 181/91 (BP Location: Right Arm)   Pulse 83   Resp 18   SpO2 100%   Physical Exam  Constitutional: She is oriented to person, place, and time. She appears well-developed and well-nourished. No distress.  HENT:  Head: Normocephalic and atraumatic.  Neck: Normal range of motion. Neck supple.  Cardiovascular: Normal rate and regular rhythm.  Exam reveals no gallop and no friction rub.   No murmur heard. Pulmonary/Chest: Effort normal and breath sounds normal. No respiratory distress. She has no wheezes.  Abdominal: Soft. Bowel sounds are normal. She exhibits no distension. There is no tenderness.  Musculoskeletal: Normal range of motion.  There is tenderness to palpation in the soft tissues of the left thoracic and lumbar paraspinal musculature. There is no bony tenderness or step-off.  The left knee appears to have a moderate-sized effusion. Patient tells me her knee is normally swollen. There is no crepitus. She has good range of motion with no instability.  Neurological: She is alert and oriented to person, place, and time.  Skin: Skin is warm and dry. She is not diaphoretic.  Nursing note and vitals  reviewed.    ED Treatments / Results  Labs (all labs ordered are listed, but only abnormal results are displayed) Labs Reviewed - No data to display  EKG  EKG Interpretation None       Radiology No results found.  Procedures Procedures (including critical care time)  Medications Ordered in ED Medications - No data to display   Initial Impression / Assessment and Plan / ED Course  I have reviewed the triage vital signs and the nursing notes.  Pertinent labs & imaging results that were available during my care of the patient were reviewed by me and considered in my medical decision making (see chart for details).  Clinical Course     X-rays are negative for fracture. There is an abnormal lucency on the chest x-ray that can be followed up as an outpatient with a chest CT. She is to return as needed for any problems.  Final Clinical Impressions(s) / ED Diagnoses   Final diagnoses:  None    New Prescriptions New Prescriptions   No medications on file     Veryl Speak, MD 07/31/16 2133

## 2016-07-31 NOTE — Discharge Instructions (Signed)
Ibuprofen 600 mg every 6 hours as needed for pain.  Follow up with your primary Dr. if not improving in the next week.

## 2016-08-03 ENCOUNTER — Ambulatory Visit (INDEPENDENT_AMBULATORY_CARE_PROVIDER_SITE_OTHER): Payer: Medicare Other

## 2016-08-03 DIAGNOSIS — E538 Deficiency of other specified B group vitamins: Secondary | ICD-10-CM | POA: Diagnosis not present

## 2016-08-03 MED ORDER — CYANOCOBALAMIN 1000 MCG/ML IJ SOLN
1000.0000 ug | Freq: Once | INTRAMUSCULAR | Status: AC
  Administered 2016-08-03: 1000 ug via INTRAMUSCULAR

## 2016-08-03 NOTE — Progress Notes (Addendum)
Patient comes in for B 12 injection .  Injected left deltoid patient tolerated injection well.    Reviewed.  Dr Scott  

## 2016-08-23 ENCOUNTER — Telehealth: Payer: Self-pay | Admitting: Internal Medicine
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
## 2016-08-23 NOTE — Telephone Encounter (Signed)
Left message to call.

## 2016-08-23 NOTE — Telephone Encounter (Signed)
Patient is states she is having neck and back pain appointment scheduled .

## 2016-08-24 ENCOUNTER — Ambulatory Visit
Admission: RE | Admit: 2016-08-24 | Discharge: 2016-08-24 | Disposition: A | Discharge status: Home / Self Care | Payer: Medicare Other | Source: Ambulatory Visit | Attending: Family | Admitting: Family

## 2016-08-24 ENCOUNTER — Ambulatory Visit (INDEPENDENT_AMBULATORY_CARE_PROVIDER_SITE_OTHER): Payer: Medicare Other | Admitting: Family

## 2016-08-24 ENCOUNTER — Encounter: Payer: Self-pay | Admitting: Family

## 2016-08-24 VITALS — BP 126/82 | HR 83 | Temp 98.4°F | Ht 59.0 in | Wt 149.6 lb

## 2016-08-24 DIAGNOSIS — M542 Cervicalgia: Secondary | ICD-10-CM | POA: Insufficient documentation

## 2016-08-24 DIAGNOSIS — G319 Degenerative disease of nervous system, unspecified: Secondary | ICD-10-CM | POA: Insufficient documentation

## 2016-08-24 DIAGNOSIS — R51 Headache: Secondary | ICD-10-CM | POA: Diagnosis not present

## 2016-08-24 NOTE — Progress Notes (Signed)
Pre visit review using our clinic review tool, if applicable. No additional management support is needed unless otherwise documented below in the visit note. 
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
## 2016-08-24 NOTE — Patient Instructions (Addendum)
CT head  Trial OTC capsaicin for neck and arm pain.   Let's treat conservatively as we discussed.   If conservative treatment doesn't yield results, please let me know.   If there is no improvement in your symptoms, or if there is any worsening of symptoms, or if you have any additional concerns, please return for re-evaluation; or, if we are closed, consider going to the Emergency Room for evaluation if symptoms urgent.

## 2016-09-10 ENCOUNTER — Encounter: Payer: Medicare Other | Admitting: Internal Medicine

## 2016-09-15 ENCOUNTER — Ambulatory Visit (INDEPENDENT_AMBULATORY_CARE_PROVIDER_SITE_OTHER): Payer: Medicare Other | Admitting: Internal Medicine

## 2016-09-15 ENCOUNTER — Encounter: Payer: Self-pay | Admitting: Internal Medicine

## 2016-09-15 VITALS — BP 120/84 | HR 64 | Temp 98.1°F | Resp 16 | Ht 58.5 in | Wt 148.0 lb

## 2016-09-15 DIAGNOSIS — G8929 Other chronic pain: Secondary | ICD-10-CM

## 2016-09-15 DIAGNOSIS — E7800 Pure hypercholesterolemia, unspecified: Secondary | ICD-10-CM | POA: Diagnosis not present

## 2016-09-15 DIAGNOSIS — D649 Anemia, unspecified: Secondary | ICD-10-CM | POA: Insufficient documentation

## 2016-09-15 DIAGNOSIS — E538 Deficiency of other specified B group vitamins: Secondary | ICD-10-CM | POA: Diagnosis not present

## 2016-09-15 DIAGNOSIS — H00014 Hordeolum externum left upper eyelid: Secondary | ICD-10-CM

## 2016-09-15 DIAGNOSIS — H00019 Hordeolum externum unspecified eye, unspecified eyelid: Secondary | ICD-10-CM | POA: Insufficient documentation

## 2016-09-15 DIAGNOSIS — J4520 Mild intermittent asthma, uncomplicated: Secondary | ICD-10-CM

## 2016-09-15 DIAGNOSIS — Z0000 Encounter for general adult medical examination without abnormal findings: Secondary | ICD-10-CM

## 2016-09-15 DIAGNOSIS — Z1231 Encounter for screening mammogram for malignant neoplasm of breast: Secondary | ICD-10-CM

## 2016-09-15 DIAGNOSIS — Z780 Asymptomatic menopausal state: Secondary | ICD-10-CM

## 2016-09-15 DIAGNOSIS — I10 Essential (primary) hypertension: Secondary | ICD-10-CM | POA: Diagnosis not present

## 2016-09-15 DIAGNOSIS — E038 Other specified hypothyroidism: Secondary | ICD-10-CM

## 2016-09-15 DIAGNOSIS — M25562 Pain in left knee: Secondary | ICD-10-CM

## 2016-09-15 DIAGNOSIS — E559 Vitamin D deficiency, unspecified: Secondary | ICD-10-CM | POA: Diagnosis not present

## 2016-09-15 DIAGNOSIS — M810 Age-related osteoporosis without current pathological fracture: Secondary | ICD-10-CM

## 2016-09-15 DIAGNOSIS — Z1239 Encounter for other screening for malignant neoplasm of breast: Secondary | ICD-10-CM

## 2016-09-15 DIAGNOSIS — Z8639 Personal history of other endocrine, nutritional and metabolic disease: Secondary | ICD-10-CM

## 2016-09-15 DIAGNOSIS — D126 Benign neoplasm of colon, unspecified: Secondary | ICD-10-CM

## 2016-09-15 LAB — CBC WITH DIFFERENTIAL/PLATELET
BASOS ABS: 0.1 10*3/uL (ref 0.0–0.1)
BASOS PCT: 1.8 % (ref 0.0–3.0)
EOS ABS: 0.1 10*3/uL (ref 0.0–0.7)
Eosinophils Relative: 1.3 % (ref 0.0–5.0)
HEMATOCRIT: 27.4 % — AB (ref 36.0–46.0)
Hemoglobin: 9.2 g/dL — ABNORMAL LOW (ref 12.0–15.0)
LYMPHS ABS: 1.9 10*3/uL (ref 0.7–4.0)
LYMPHS PCT: 38.7 % (ref 12.0–46.0)
MCHC: 33.6 g/dL (ref 30.0–36.0)
MCV: 90 fl (ref 78.0–100.0)
MONO ABS: 0.3 10*3/uL (ref 0.1–1.0)
Monocytes Relative: 5.6 % (ref 3.0–12.0)
NEUTROS ABS: 2.5 10*3/uL (ref 1.4–7.7)
NEUTROS PCT: 52.6 % (ref 43.0–77.0)
PLATELETS: 356 10*3/uL (ref 150.0–400.0)
RBC: 3.04 Mil/uL — ABNORMAL LOW (ref 3.87–5.11)
RDW: 15.2 % (ref 11.5–15.5)
WBC: 4.8 10*3/uL (ref 4.0–10.5)

## 2016-09-15 LAB — COMPREHENSIVE METABOLIC PANEL
ALBUMIN: 4.4 g/dL (ref 3.5–5.2)
ALK PHOS: 47 U/L (ref 39–117)
ALT: 9 U/L (ref 0–35)
AST: 19 U/L (ref 0–37)
BILIRUBIN TOTAL: 0.3 mg/dL (ref 0.2–1.2)
BUN: 12 mg/dL (ref 6–23)
CO2: 28 mEq/L (ref 19–32)
CREATININE: 1.16 mg/dL (ref 0.40–1.20)
Calcium: 9.2 mg/dL (ref 8.4–10.5)
Chloride: 105 mEq/L (ref 96–112)
GFR: 59.57 mL/min — ABNORMAL LOW (ref >60.00)
GLUCOSE: 102 mg/dL — AB (ref 70–99)
POTASSIUM: 4.1 meq/L (ref 3.5–5.1)
SODIUM: 141 meq/L (ref 135–145)
TOTAL PROTEIN: 7.2 g/dL (ref 6.0–8.3)

## 2016-09-15 LAB — LIPID PANEL
CHOL/HDL RATIO: 5
Cholesterol: 311 mg/dL — ABNORMAL HIGH (ref 0–200)
HDL: 58.5 mg/dL (ref >39.00)
LDL Cholesterol: 216 mg/dL — ABNORMAL HIGH (ref 0–99)
NonHDL: 252.81
Triglycerides: 182 mg/dL — ABNORMAL HIGH (ref 0.0–149.0)
VLDL: 36.4 mg/dL (ref 0.0–40.0)

## 2016-09-15 LAB — TSH: TSH: 67.33 u[IU]/mL — ABNORMAL HIGH (ref 0.35–4.50)

## 2016-09-15 LAB — VITAMIN B12: Vitamin B-12: 346 pg/mL (ref 211–911)

## 2016-09-15 LAB — VITAMIN D 25 HYDROXY (VIT D DEFICIENCY, FRACTURES): VITD: 9.78 ng/mL — AB (ref 30.00–100.00)

## 2016-09-15 MED ORDER — PREDNISONE 10 MG PO TABS: ORAL_TABLET | ORAL | 0 refills | Status: DC

## 2016-09-15 MED ORDER — VITAMIN D (ERGOCALCIFEROL) 1.25 MG (50000 UNIT) PO CAPS: 50000.0000 [IU] | ORAL_CAPSULE | ORAL | 3 refills | Status: DC

## 2016-09-15 MED ORDER — ALBUTEROL SULFATE HFA 108 (90 BASE) MCG/ACT IN AERS: 2.0000 | INHALATION_SPRAY | Freq: Four times a day (QID) | RESPIRATORY_TRACT | 3 refills | Status: DC | PRN

## 2016-09-15 MED ORDER — CYANOCOBALAMIN 1000 MCG/ML IJ SOLN
1000.0000 ug | Freq: Once | INTRAMUSCULAR | Status: AC
  Administered 2016-09-15: 1000 ug via INTRAMUSCULAR

## 2016-09-15 MED ORDER — ALBUTEROL SULFATE (2.5 MG/3ML) 0.083% IN NEBU: 2.5000 mg | INHALATION_SOLUTION | Freq: Four times a day (QID) | RESPIRATORY_TRACT | 1 refills | Status: DC | PRN

## 2016-09-15 NOTE — Assessment & Plan Note (Signed)
Mild, intermittent.  Refilling albuterol MDI and nebulizer.

## 2016-09-15 NOTE — Assessment & Plan Note (Signed)
By feb 2016 colonoscopy..  followup in 5 yrs

## 2016-09-15 NOTE — Progress Notes (Addendum)
Patient ID: Christine Manning, female    DOB: [birth year]  Age: 70 y.o. MRN: [id number]  The patient is here for her annual  wellness examination and management of other chronic and acute problems.  USING [facility] NEEDS SH[facility]gles rx Due for mammogram Feb 2016 sessile polyp, tubular adenoma  5 yr follow up   Had a fall off a stool  Hurt her back and knee  . Treated in ER  Lumbar and thoracic spine films done in er   No fractures,  But progression of DDD  Has back pain with prolonged standing,  Sometimes with walking .  occasionally radiates down left leg to calf. Tries  not to take anything  Left knee DJD pain as well   HAS NOTICED A CHANGE IN THE SIZE AND SHAPE OF A MOLE ON RIGHT POSTERIOR CALF   HAS BEEN HAVING INCREASED SYMPTOMS OF ALLERGIC CONJUNCTIVITIS  NOTES A FOCAL SWELLING OF HER UPPER EYELID   Rare asthma exacerbations.  Uses nebulizer prn, one episode per month    Fasting today .  No appetite.  Eats once daily .  Discussed diet.  Eats lunch or dinner  Drinking kombucha  almost daily for IBS     The risk factors are reflected in the social history.  The roster of all physicians providing medical care to patient - is listed in the Snapshot section of the chart.  Activities of daily living:  The patient is 100% independent in all ADLs: dressing, toileting, feeding as well as independent mobility  Home safety : The patient has smoke detectors in the home. They wear seatbelts.  There are no firearms at home. There is no violence in the home.   There is no risks for hepatitis, STDs or HIV. There is no   history of blood transfusion. They have no travel history to infectious disease endemic areas of the world.  The patient has seen their dentist in the last six month. They have seen their eye doctor in the last year. They admit to slight hearing difficulty with regard to whispered voices and some television programs.  They have deferred audiologic testing in the last year.  They  do not  have excessive sun exposure. Discussed the need for sun protection: hats, long sleeves and use of sunscreen if there is significant sun exposure.   Diet: the importance of a healthy diet is discussed. They do have a healthy diet.  The benefits of regular aerobic exercise were discussed. She walks 4 times per week ,  20 minutes.   Depression screen: there are no signs or vegative symptoms of depression- irritability, change in appetite, anhedonia, sadness/tearfullness.  Cognitive assessment: the patient manages all their financial and personal affairs and is actively engaged. They could relate day,date,year and events; recalled 2/3 objects at 3 minutes; performed clock-face test normally.  The following portions of the patient's history were reviewed and updated as appropriate: allergies, current medications, past family history, past medical history,  past surgical history, past social history  and problem list.  Visual acuity was not assessed per patient preference since she has regular follow up with her ophthalmologist. Hearing and body mass index were assessed and reviewed.   During the course of the visit the patient was educated and counseled about appropriate screening and preventive services including : fall prevention , diabetes screening, nutrition counseling, colorectal cancer screening, and recommended immunizations.    CC: The primary encounter diagnosis was Pure hypercholesterolemia. Diagnoses of Essential hypertension, Other  specified hypothyroidism, Vitamin D deficiency, B12 deficiency, Breast cancer screening, Postmenopausal estrogen deficiency, Tubular adenoma of colon, Postmenopausal osteoporosis, History of thyroid nodule, Hordeolum externum of left upper eyelid, Chronic pain of left knee, Encounter for preventive health examination, Mild intermittent asthma, unspecified whether complicated, and Normocytic anemia were also pertinent to this visit.  History Christine Manning  has a past medical history of Asthma; Colon polyp; DDD (degenerative disc disease); Hyperlipidemia; Hypertension; Hyperthyroidism; and Personal history of DVT (deep vein thrombosis) (1980).   She has a past surgical history that includes Bladder suspension (2003); Abdominal hysterectomy (1991); Coronary angioplasty (2013); Knee arthroscopy; and Colonoscopy (2016).   Her family history includes Asthma in her brother, daughter, mother, and son; Colon cancer in her cousin; Colon cancer (age of onset: 57) in her maternal aunt; Colon polyps in her sister; Mental illness in her father and mother; Stroke in her mother; Uterine cancer in her maternal grandmother.She reports that she has never smoked. She has never used smokeless tobacco. She reports that she does not drink alcohol or use drugs.  Outpatient Medications Prior to Visit  Medication Sig Dispense Refill  . aspirin 81 MG tablet Take 81 mg by mouth daily.    . Cyanocobalamin (VITAMIN B-12 IJ) Inject 1 each as directed every 28 (twenty-eight) days.    Marland Kitchen dicyclomine (BENTYL) 20 MG tablet Take 1 tablet (20 mg total) by mouth 4 (four) times daily -  before meals and at bedtime. 90 tablet 1  . hydrOXYzine (ATARAX/VISTARIL) 25 MG tablet Take 25 mg by mouth 3 (three) times daily as needed for anxiety.     . ibandronate (BONIVA) 150 MG tablet Take 1 tablet (150 mg total) by mouth every 30 (thirty) days. Take in the morning with a full glass of water, on an empty stomach, and do not take anything else by mouth or lie down for the next 30 min. 1 tablet 11  . INFLUENZA VAC SPLIT HIGH-DOSE IM Inject 1 each into the muscle once.    . Lactobacillus-Inulin ([place]) CAPS Take 1 capsule by mouth daily. 14 capsule 0  . levothyroxine (SYNTHROID, LEVOTHROID) 100 MCG tablet Take 1 tablet (100 mcg total) by mouth daily before breakfast. 90 tablet 0  . lisinopril (PRINIVIL,ZESTRIL) 40 MG tablet Take 1 tablet (40 mg total) by mouth daily. 90 tablet 1   . loratadine (CLARITIN) 10 MG tablet Take 1 tablet (10 mg total) by mouth daily. 30 tablet 0  . metoprolol tartrate (LOPRESSOR) 25 MG tablet Take 1 tablet (25 mg total) by mouth 2 (two) times daily. 180 tablet 1  . mometasone (NASONEX) 50 MCG/ACT nasal spray Place 2 sprays into the nose daily.    . Multiple Vitamin (MULTIVITAMIN) tablet Take 1 tablet by mouth daily.    . ondansetron (ZOFRAN) 4 MG tablet Take 1 tablet (4 mg total) by mouth every 8 (eight) hours as needed for nausea or vomiting. 20 tablet 0  . pantoprazole (PROTONIX) 40 MG tablet Take 40 mg by mouth daily as needed.     . simvastatin (ZOCOR) 40 MG tablet TAKE ONE TABLET BY MOUTH IN THE EVENING 90 tablet 0  . Vitamin D, Ergocalciferol, (DRISDOL) 50000 units CAPS capsule TAKE ONE CAPSULE BY MOUTH ONCE A WEEK 12 capsule 0  . Vitamin D, Ergocalciferol, (DRISDOL) 50000 units CAPS capsule Take 1 capsule (50,000 Units total) by mouth once a week. 12 capsule 0  . zoster vaccine live, PF, (ZOSTAVAX) 91478 UNT/0.65ML injection Inject 19,400 Units into the skin  once. 1 each 0  . zoster vaccine live, PF, (ZOSTAVAX) 40981 UNT/0.65ML injection Inject 19,400 Units into the skin once. 1 each 0  . albuterol (PROVENTIL HFA;VENTOLIN HFA) 108 (90 Base) MCG/ACT inhaler Inhale 2 puffs into the lungs every 6 (six) hours as needed for wheezing or shortness of breath. 1 Inhaler 11  . predniSONE (DELTASONE) 10 MG tablet Take 60 mg (6 tablets) by mouth on day 1, then take 50 mg (5 tablets) by mouth on day 2, then take 40 mg (4 tablets) by mouth on day 3, then take 30 mg (3 tablets) by mouth on day 4, then take 20 mg (2 tablets) by mouth on 5, then take 10 mg (1 tablet) by mouth on day 6. 21 tablet 0   No facility-administered medications prior to visit.     Review of Systems   Patient denies headache, fevers, malaise, unintentional weight loss, skin rash, eye pain, sinus congestion and sinus pain, sore throat, dysphagia,  hemoptysis , cough, dyspnea,  wheezing, chest pain, palpitations, orthopnea, edema, abdominal pain, nausea, melena, diarrhea, constipation, flank pain, dysuria, hematuria, urinary  Frequency, nocturia, numbness, tingling, seizures,  Focal weakness, Loss of consciousness,  Tremor, insomnia, depression, anxiety, and suicidal ideation.      Objective:  BP 120/84   Pulse 64   Temp 98.1 F (36.7 C) (Oral)   Resp 16   Ht 4' 10.5" (1.486 m)   Wt 148 lb (67.1 kg)   BMI 30.41 kg/m   Physical Exam   General appearance: alert, cooperative and appears stated age Head: Normocephalic, without obvious abnormality, atraumatic Eyes: conjunctivae/corneas clear. PERRL, EOM's intact. Fundi benign. Left upper eyelid with cyst, non erythematous  Ears: normal TM's and external ear canals both ears Nose: Nares normal. Septum midline. Mucosa normal. No drainage or sinus tenderness. Throat: lips, mucosa, and tongue normal; teeth and gums normal Neck: no adenopathy, no carotid bruit, no JVD, supple, symmetrical, trachea midline and thyroid not enlarged, symmetric, no tenderness/mass/nodules Lungs: clear to auscultation bilaterally Breasts: normal appearance, no masses or tenderness Heart: regular rate and rhythm, S1, S2 normal, no murmur, click, rub or gallop Abdomen: soft, non-tender; bowel sounds normal; no masses,  no organomegaly Extremities: extremities normal, atraumatic, no cyanosis or edema Pulses: 2+ and symmetric Skin: Skin color, texture, turgor normal. No rashes or lesions Neurologic: Alert and oriented X 3, normal strength and tone. Abnormal  Reflexes due to delayed uptake . Normal coordination and gait.     Assessment & Plan:   Problem List Items Addressed This Visit    Asthma    Mild, intermittent.  Refilling albuterol MDI and nebulizer.       Relevant Medications   predniSONE (DELTASONE) 10 MG tablet   albuterol (PROVENTIL) (2.5 MG/3ML) 0.083% nebulizer solution   albuterol (PROVENTIL HFA;VENTOLIN HFA) 108 (90  Base) MCG/ACT inhaler   B12 deficiency   Relevant Medications   cyanocobalamin ((VITAMIN B-12)) injection 1,000 mcg (Completed)   Other Relevant Orders   CBC with Differential/Platelet (Completed)   Vitamin B12 (Completed)   CBC with Differential/Platelet   Encounter for preventive health examination    Annual comprehensive preventive exam was done as well as an evaluation and management of chronic conditions .  During the course of the visit the patient was educated and counseled about appropriate screening and preventive services including :  diabetes screening, lipid analysis with projected  10 year  risk for CAD , nutrition counseling, breast, cervical and colorectal cancer screening, and recommended immunizations.  Printed recommendations for health maintenance screenings was given      History of thyroid nodule   Hordeolum externum    Secondary to chronic eye rubbing.  recommended use of warm compresses to encourage drainage       Hyperlipidemia - Primary    Tolerating simvastatin for reduction in CAD risk. Hepatic enzymes are normal.  Cholesterol is elevated currently due to severe hypothyroidism. No changes today . Lab Results  Component Value Date   CHOL 311 (H) 09/15/2016   HDL 58.50 09/15/2016   LDLCALC 216 (H) 09/15/2016   LDLDIRECT 165.0 09/10/2015   TRIG 182.0 (H) 09/15/2016   CHOLHDL 5 09/15/2016    Lab Results  Component Value Date   ALT 9 09/15/2016   AST 19 09/15/2016   ALKPHOS 47 09/15/2016   BILITOT 0.3 09/15/2016           Relevant Orders   Lipid panel (Completed)   Lipid panel   Hypertension   Relevant Orders   Comprehensive metabolic panel (Completed)   Hypothyroidism    Thyroid function is very underactive  On currrent dose of 100 mcg.  Will review proper dosing , and increase dose to 125  Lab Results  Component Value Date   TSH 67.33 (H) 09/15/2016         Relevant Orders   TSH (Completed)   TSH   Left knee pain    Secondary to  blunt trauma from recent fall.  Some crepitus noted on exam today without effusion.       Normocytic anemia    New onset likely due to underactive thyroid.  Will repeat cbc in 6 weeks.       Relevant Medications   cyanocobalamin ((VITAMIN B-12)) injection 1,000 mcg (Completed)   Other Relevant Orders   CBC with Differential/Platelet   Iron and TIBC   Ferritin   Postmenopausal osteoporosis    Diagnosed with DEXA scan , treated by [facility] with improvement in T score by most recent DEXA in 2015.       Tubular adenoma of colon    By feb 2016 colonoscopy..  followup in 5 yrs       Vitamin D deficiency   Relevant Orders   VITAMIN D 25 Hydroxy (Vit-D Deficiency, Fractures) (Completed)    Other Visit Diagnoses    Breast cancer screening       Relevant Orders   MM DIGITAL SCREENING BILATERAL   Postmenopausal estrogen deficiency       Relevant Orders   DG Bone Density      I am having [name] maintain her hydrOXYzine, mometasone, pantoprazole, aspirin, [facility] [employer], zoster vaccine live (PF), multivitamin, ibandronate, loratadine, zoster vaccine live (PF), dicyclomine, ondansetron, Vitamin D (Ergocalciferol), lisinopril, levothyroxine, Vitamin D (Ergocalciferol), metoprolol tartrate, simvastatin, INFLUENZA VAC SPLIT HIGH-DOSE IM, Cyanocobalamin (VITAMIN B-12 IJ), predniSONE, albuterol, and albuterol. We administered cyanocobalamin.  Meds ordered this encounter  Medications  . DISCONTD: predniSONE (DELTASONE) 10 MG tablet    Sig: 6 tablets all at once on Day 1,  Then taper by 1 tablet daily until gone    Dispense:  21 tablet    Refill:  0  . predniSONE (DELTASONE) 10 MG tablet    Sig: 6 tablets all at once on Day 1,  Then taper by 1 tablet daily until gone    Dispense:  21 tablet    Refill:  0  . albuterol (PROVENTIL) (2.5 MG/3ML) 0.083% nebulizer solution    Sig: Take 3 mLs (  2.5 mg total) by nebulization every 6 (six) hours as needed for wheezing or shortness of  breath.    Dispense:  150 mL    Refill:  1  . albuterol (PROVENTIL HFA;VENTOLIN HFA) 108 (90 Base) MCG/ACT inhaler    Sig: Inhale 2 puffs into the lungs every 6 (six) hours as needed for wheezing or shortness of breath.    Dispense:  1 Inhaler    Refill:  3    PROAIR PLEASE  . cyanocobalamin ((VITAMIN B-12)) injection 1,000 mcg    Medications Discontinued During This Encounter  Medication Reason  . predniSONE (DELTASONE) 10 MG tablet Reorder  . predniSONE (DELTASONE) 10 MG tablet Reorder  . albuterol (PROVENTIL HFA;VENTOLIN HFA) 108 (90 Base) MCG/ACT inhaler Reorder    Follow-up: Return in about 6 months (around 03/16/2017).   Crecencio Mc, MD

## 2016-09-15 NOTE — Addendum Note (Signed)
Addended by: Crecencio Mc on: 09/15/2016 08:51 PM   Modules accepted: Orders

## 2016-09-15 NOTE — Assessment & Plan Note (Signed)
Diagnosed with DEXA scan , treated by [facility] with improvement in T score by most recent DEXA in 2015.

## 2016-09-15 NOTE — Assessment & Plan Note (Signed)
Secondary to blunt trauma from recent fall.  Some crepitus noted on exam today without effusion.

## 2016-09-15 NOTE — Assessment & Plan Note (Signed)
Thyroid function is very underactive  On currrent dose of 100 mcg.  Will review proper dosing , and increase dose to 125  Lab Results  Component Value Date   TSH 67.33 (H) 09/15/2016

## 2016-09-15 NOTE — Assessment & Plan Note (Signed)
New onset likely due to underactive thyroid.  Will repeat cbc in 6 weeks.

## 2016-09-15 NOTE — Patient Instructions (Addendum)
When you have an attack of sciatica, you can take a prednisone taper (called in to [facility]) plus 500 mg tylenol every 6 hours, up to 2000  Mg daily  I want you to lose 10 lbs over the next year to lower your BMI.  DON'T SKIP MEALS !!!   I Recommend DRINKING ONE of these  Low Carb high Protein premixed Shakes instead of skipping breakfast or lunch :   Premier Protein  Atkins Advantage Muscle Milk EAS AdvantEdge   All of these are available at [employer], [place]  And all taste good.     I DO recommend the new shingles vaccine called ShingRx .  We will be getting in the summer   I have ordered your mammogram and will make the dermatology referral  You can try alternating between warm and cool compresses for the cyst on your upper eyelid  For your allergies  Try generic zyrtec, which is cetirizine  10 mg daily .  Allegra is availabel generically as fexofenadine and it comes in 60 mg and 180 mg once daily strengths.  If one of these doesn't  control your itchy eyes,  Let me know and I will prescribe an antihistamine eye drop.    Health Maintenance, Female Introduction Adopting a healthy lifestyle and getting preventive care can go a long way to promote health and wellness. Talk with your health care provider about what schedule of regular examinations is right for you. This is a good chance for you to check in with your provider about disease prevention and staying healthy. In between checkups, there are plenty of things you can do on your own. Experts have done a lot of research about which lifestyle changes and preventive measures are most likely to keep you healthy. Ask your health care provider for more information. Weight and diet Eat a healthy diet  Be sure to include plenty of vegetables, fruits, low-fat dairy products, and lean protein.  Do not eat a lot of foods high in solid fats, added sugars, or salt.  Get regular exercise. This is one of the most  important things you can do for your health.  Most adults should exercise for at least 150 minutes each week. The exercise should increase your heart rate and make you sweat (moderate-intensity exercise).  Most adults should also do strengthening exercises at least twice a week. This is in addition to the moderate-intensity exercise. Maintain a healthy weight  Body mass index (BMI) is a measurement that can be used to identify possible weight problems. It estimates body fat based on height and weight. Your health care provider can help determine your BMI and help you achieve or maintain a healthy weight.  For females 79 years of age and older:  A BMI below 18.5 is considered underweight.  A BMI of 18.5 to 24.9 is normal.  A BMI of 25 to 29.9 is considered overweight.  A BMI of 30 and above is considered obese. Watch levels of cholesterol and blood lipids  You should start having your blood tested for lipids and cholesterol at 69 years of age, then have this test every 5 years.  You may need to have your cholesterol levels checked more often if:  Your lipid or cholesterol levels are high.  You are older than 69 years of age.  You are at high risk for heart disease. Cancer screening Lung Cancer  Lung cancer screening is recommended for adults 55-80  years old who are at high risk for lung cancer because of a history of smoking.  A yearly low-dose CT scan of the lungs is recommended for people who:  Currently smoke.  Have quit within the past 15 years.  Have at least a 30-pack-year history of smoking. A pack year is smoking an average of one pack of cigarettes a day for 1 year.  Yearly screening should continue until it has been 15 years since you quit.  Yearly screening should stop if you develop a health problem that would prevent you from having lung cancer treatment. Breast Cancer  Practice breast self-awareness. This means understanding how your breasts normally appear  and feel.  It also means doing regular breast self-exams. Let your health care provider know about any changes, no matter how small.  If you are in your 20s or 30s, you should have a clinical breast exam (CBE) by a health care provider every 1-3 years as part of a regular health exam.  If you are 29 or older, have a CBE every year. Also consider having a breast X-ray (mammogram) every year.  If you have a family history of breast cancer, talk to your health care provider about genetic screening.  If you are at high risk for breast cancer, talk to your health care provider about having an MRI and a mammogram every year.  Breast cancer gene (BRCA) assessment is recommended for women who have family members with BRCA-related cancers. BRCA-related cancers include:  Breast.  Ovarian.  Tubal.  Peritoneal cancers.  Results of the assessment will determine the need for genetic counseling and BRCA1 and BRCA2 testing. Cervical Cancer  Your health care provider may recommend that you be screened regularly for cancer of the pelvic organs (ovaries, uterus, and vagina). This screening involves a pelvic examination, including checking for microscopic changes to the surface of your cervix (Pap test). You may be encouraged to have this screening done every 3 years, beginning at age 42.  For women ages 65-65, health care providers may recommend pelvic exams and Pap testing every 3 years, or they may recommend the Pap and pelvic exam, combined with testing for human papilloma virus (HPV), every 5 years. Some types of HPV increase your risk of cervical cancer. Testing for HPV may also be done on women of any age with unclear Pap test results.  Other health care providers may not recommend any screening for nonpregnant women who are considered low risk for pelvic cancer and who do not have symptoms. Ask your health care provider if a screening pelvic exam is right for you.  If you have had past treatment  for cervical cancer or a condition that could lead to cancer, you need Pap tests and screening for cancer for at least 20 years after your treatment. If Pap tests have been discontinued, your risk factors (such as having a new sexual partner) need to be reassessed to determine if screening should resume. Some women have medical problems that increase the chance of getting cervical cancer. In these cases, your health care provider may recommend more frequent screening and Pap tests. Colorectal Cancer  This type of cancer can be detected and often prevented.  Routine colorectal cancer screening usually begins at 68 years of age and continues through 69 years of age.  Your health care provider may recommend screening at an earlier age if you have risk factors for colon cancer.  Your health care provider may also recommend using home test kits  to check for hidden blood in the stool.  A small camera at the end of a tube can be used to examine your colon directly (sigmoidoscopy or colonoscopy). This is done to check for the earliest forms of colorectal cancer.  Routine screening usually begins at age 45.  Direct examination of the colon should be repeated every 5-10 years through 69 years of age. However, you may need to be screened more often if early forms of precancerous polyps or small growths are found. Skin Cancer  Check your skin from head to toe regularly.  Tell your health care provider about any new moles or changes in moles, especially if there is a change in a mole's shape or color.  Also tell your health care provider if you have a mole that is larger than the size of a pencil eraser.  Always use sunscreen. Apply sunscreen liberally and repeatedly throughout the day.  Protect yourself by wearing long sleeves, pants, a wide-brimmed hat, and sunglasses whenever you are outside. Heart disease, diabetes, and high blood pressure  High blood pressure causes heart disease and increases  the risk of stroke. High blood pressure is more likely to develop in:  People who have blood pressure in the high end of the normal range (130-139/85-89 mm Hg).  People who are overweight or obese.  People who are African American.  If you are 38-55 years of age, have your blood pressure checked every 3-5 years. If you are 32 years of age or older, have your blood pressure checked every year. You should have your blood pressure measured twice-once when you are at a hospital or clinic, and once when you are not at a hospital or clinic. Record the average of the two measurements. To check your blood pressure when you are not at a hospital or clinic, you can use:  An automated blood pressure machine at a pharmacy.  A home blood pressure monitor.  If you are between 41 years and 29 years old, ask your health care provider if you should take aspirin to prevent strokes.  Have regular diabetes screenings. This involves taking a blood sample to check your fasting blood sugar level.  If you are at a normal weight and have a low risk for diabetes, have this test once every three years after 69 years of age.  If you are overweight and have a high risk for diabetes, consider being tested at a younger age or more often. Preventing infection Hepatitis B  If you have a higher risk for hepatitis B, you should be screened for this virus. You are considered at high risk for hepatitis B if:  You were born in a country where hepatitis B is common. Ask your health care provider which countries are considered high risk.  Your parents were born in a high-risk country, and you have not been immunized against hepatitis B (hepatitis B vaccine).  You have HIV or AIDS.  You use needles to inject street drugs.  You live with someone who has hepatitis B.  You have had sex with someone who has hepatitis B.  You get hemodialysis treatment.  You take certain medicines for conditions, including cancer, organ  transplantation, and autoimmune conditions. Hepatitis C  Blood testing is recommended for:  Everyone born from [birth year] through 1964.  Anyone with known risk factors for hepatitis C. Sexually transmitted infections (STIs)  You should be screened for sexually transmitted infections (STIs) including gonorrhea and chlamydia if:  You are sexually active  and are younger than 69 years of age.  You are older than 69 years of age and your health care provider tells you that you are at risk for this type of infection.  Your sexual activity has changed since you were last screened and you are at an increased risk for chlamydia or gonorrhea. Ask your health care provider if you are at risk.  If you do not have HIV, but are at risk, it may be recommended that you take a prescription medicine daily to prevent HIV infection. This is called pre-exposure prophylaxis (PrEP). You are considered at risk if:  You are sexually active and do not regularly use condoms or know the HIV status of your partner(s).  You take drugs by injection.  You are sexually active with a partner who has HIV. Talk with your health care provider about whether you are at high risk of being infected with HIV. If you choose to begin PrEP, you should first be tested for HIV. You should then be tested every 3 months for as long as you are taking PrEP. Pregnancy  If you are premenopausal and you may become pregnant, ask your health care provider about preconception counseling.  If you may become pregnant, take 400 to 800 micrograms (mcg) of folic acid every day.  If you want to prevent pregnancy, talk to your health care provider about birth control (contraception). Osteoporosis and menopause  Osteoporosis is a disease in which the bones lose minerals and strength with aging. This can result in serious bone fractures. Your risk for osteoporosis can be identified using a bone density scan.  If you are 63 years of age or older, or  if you are at risk for osteoporosis and fractures, ask your health care provider if you should be screened.  Ask your health care provider whether you should take a calcium or vitamin D supplement to lower your risk for osteoporosis.  Menopause may have certain physical symptoms and risks.  Hormone replacement therapy may reduce some of these symptoms and risks. Talk to your health care provider about whether hormone replacement therapy is right for you. Follow these instructions at home:  Schedule regular health, dental, and eye exams.  Stay current with your immunizations.  Do not use any tobacco products including cigarettes, chewing tobacco, or electronic cigarettes.  If you are pregnant, do not drink alcohol.  If you are breastfeeding, limit how much and how often you drink alcohol.  Limit alcohol intake to no more than 1 drink per day for nonpregnant women. One drink equals 12 ounces of beer, 5 ounces of wine, or 1 ounces of hard liquor.  Do not use street drugs.  Do not share needles.  Ask your health care provider for help if you need support or information about quitting drugs.  Tell your health care provider if you often feel depressed.  Tell your health care provider if you have ever been abused or do not feel safe at home. This information is not intended to replace advice given to you by your health care provider. Make sure you discuss any questions you have with your health care provider. Document Released: 03/22/2011 Document Revised: 02/12/2016 Document Reviewed: 06/10/2015  2017 Elsevier

## 2016-09-15 NOTE — Assessment & Plan Note (Signed)
Secondary to chronic eye rubbing.  recommended use of warm compresses to encourage drainage

## 2016-09-15 NOTE — Addendum Note (Signed)
Addended by: Crecencio Mc on: 09/15/2016 08:54 PM   Modules accepted: Orders

## 2016-09-15 NOTE — Assessment & Plan Note (Signed)
Tolerating simvastatin for reduction in CAD risk. Hepatic enzymes are normal.  Cholesterol is elevated currently due to severe hypothyroidism. No changes today . Lab Results  Component Value Date   CHOL 311 (H) 09/15/2016   HDL 58.50 09/15/2016   LDLCALC 216 (H) 09/15/2016   LDLDIRECT 165.0 09/10/2015   TRIG 182.0 (H) 09/15/2016   CHOLHDL 5 09/15/2016    Lab Results  Component Value Date   ALT 9 09/15/2016   AST 19 09/15/2016   ALKPHOS 47 09/15/2016   BILITOT 0.3 09/15/2016

## 2016-09-15 NOTE — Assessment & Plan Note (Signed)
Annual comprehensive preventive exam was done as well as an evaluation and management of chronic conditions .  During the course of the visit the patient was educated and counseled about appropriate screening and preventive services including :  diabetes screening, lipid analysis with projected  10 year  risk for CAD , nutrition counseling, breast, cervical and colorectal cancer screening, and recommended immunizations.  Printed recommendations for health maintenance screenings was given 

## 2016-09-16 NOTE — Progress Notes (Signed)
Pre-visit discussion using our clinic review tool. No additional management support is needed unless otherwise documented below in the visit note.  

## 2016-09-22 NOTE — Progress Notes (Signed)
Unable to communicate labs to patient. Letter sent to patient

## 2016-09-23 ENCOUNTER — Telehealth: Payer: Self-pay | Admitting: Internal Medicine

## 2016-09-23 ENCOUNTER — Other Ambulatory Visit: Payer: Self-pay

## 2016-09-23 MED ORDER — SIMVASTATIN 40 MG PO TABS: 40.0000 mg | ORAL_TABLET | Freq: Every evening | ORAL | 3 refills | Status: DC

## 2016-09-23 MED ORDER — LEVOTHYROXINE SODIUM 100 MCG PO TABS: 100.0000 ug | ORAL_TABLET | Freq: Every day | ORAL | 3 refills | Status: DC

## 2016-09-23 MED ORDER — METOPROLOL TARTRATE 25 MG PO TABS
25.0000 mg | ORAL_TABLET | Freq: Two times a day (BID) | ORAL | 3 refills | Status: DC
Start: 2016-09-23 — End: 2016-09-27

## 2016-09-23 MED ORDER — LISINOPRIL 40 MG PO TABS
40.0000 mg | ORAL_TABLET | Freq: Every day | ORAL | 3 refills | Status: DC
Start: 2016-09-23 — End: 2016-09-27

## 2016-09-23 NOTE — Telephone Encounter (Signed)
Sent Simvastatin to [facility]

## 2016-09-23 NOTE — Telephone Encounter (Signed)
Rx Lisinopril, Levothyroxine, metoprolol sent to [facility]

## 2016-09-23 NOTE — Telephone Encounter (Signed)
Pt called and is requesting refills on levothyroxine (SYNTHROID, LEVOTHROID) 100 MCG tablet, lisinopril (PRINIVIL,ZESTRIL) 40 MG tablet, metoprolol tartrate (LOPRESSOR) 25 MG tablet, simvastatin (ZOCOR) 40 MG tablet, and albuterol (PROVENTIL) (2.5 MG/3ML) 0.083% nebulizer solution. She needs it to be written for 90 days because she pays more for a fewer amount.  [place], [place]  Pt also needs to refill albuterol (PROVENTIL HFA;VENTOLIN HFA) 108 (90 Base) MCG/ACT inhaler to her local pharmacy. She will need levothyroxine (SYNTHROID, LEVOTHROID) 100 MCG tablet, lisinopril (PRINIVIL,ZESTRIL) 40 MG tablet, metoprolol tartrate (LOPRESSOR) 25 MG tablet, simvastatin (ZOCOR) 40 MG tablet, and albuterol (PROVENTIL) (2.5 MG/3ML) 0.083% nebulizer solution filled for 2 weeks until she gets the other ones through the mail. She states that she has no more medication.   [place], [place]  Please advise, thank you!   Call pt @ [phone]

## 2016-09-27 ENCOUNTER — Other Ambulatory Visit: Payer: Self-pay

## 2016-09-27 MED ORDER — METOPROLOL TARTRATE 25 MG PO TABS: 25.0000 mg | ORAL_TABLET | Freq: Two times a day (BID) | ORAL | 0 refills | Status: DC

## 2016-09-27 MED ORDER — LISINOPRIL 40 MG PO TABS: 40.0000 mg | ORAL_TABLET | Freq: Every day | ORAL | 0 refills | Status: DC

## 2016-09-27 MED ORDER — LEVOTHYROXINE SODIUM 125 MCG PO TABS: 125.0000 ug | ORAL_TABLET | Freq: Every day | ORAL | 0 refills | Status: DC

## 2016-09-27 NOTE — Telephone Encounter (Signed)
Patient requested refill on HCTZ. Medication was discontinued. Please advise

## 2016-09-27 NOTE — Telephone Encounter (Signed)
Please call pt at [phone]

## 2016-09-27 NOTE — Telephone Encounter (Signed)
Metoprolol, lisinopril, Levothyroxine sent to pharmacy [id number]

## 2016-09-27 NOTE — Telephone Encounter (Signed)
LMOM for patient to call back to discuss medication

## 2016-09-28 NOTE — Telephone Encounter (Signed)
Refill denied 

## 2016-09-28 NOTE — Telephone Encounter (Signed)
LMOM for patient to call back.

## 2016-10-05 ENCOUNTER — Encounter: Payer: Self-pay | Admitting: Internal Medicine

## 2016-10-11 ENCOUNTER — Other Ambulatory Visit: Payer: Self-pay

## 2016-10-11 MED ORDER — LEVOTHYROXINE SODIUM 125 MCG PO TABS: 125.0000 ug | ORAL_TABLET | Freq: Every day | ORAL | 1 refills | Status: DC

## 2016-10-19 ENCOUNTER — Ambulatory Visit: Payer: Medicare Other

## 2016-11-09 ENCOUNTER — Ambulatory Visit
Admission: RE | Admit: 2016-11-09 | Discharge: 2016-11-09 | Disposition: A | Discharge status: Home / Self Care | Payer: Medicare Other | Source: Ambulatory Visit | Attending: Internal Medicine | Admitting: Internal Medicine

## 2016-11-09 ENCOUNTER — Other Ambulatory Visit: Payer: Self-pay | Admitting: Internal Medicine

## 2016-11-09 DIAGNOSIS — Z1231 Encounter for screening mammogram for malignant neoplasm of breast: Secondary | ICD-10-CM

## 2016-11-09 DIAGNOSIS — Z780 Asymptomatic menopausal state: Secondary | ICD-10-CM

## 2016-11-09 DIAGNOSIS — M85852 Other specified disorders of bone density and structure, left thigh: Secondary | ICD-10-CM | POA: Diagnosis not present

## 2016-11-09 DIAGNOSIS — M8588 Other specified disorders of bone density and structure, other site: Secondary | ICD-10-CM | POA: Insufficient documentation

## 2016-11-09 DIAGNOSIS — Z1239 Encounter for other screening for malignant neoplasm of breast: Secondary | ICD-10-CM

## 2016-11-18 ENCOUNTER — Telehealth: Payer: Self-pay

## 2016-11-18 NOTE — Telephone Encounter (Signed)
LMTCB

## 2016-11-18 NOTE — Telephone Encounter (Signed)
-----   Message from Crecencio Mc, MD sent at 11/14/2016  6:23 PM EST ----- Bone Density scores received, she has osteopenia,  Moderate., unchanged over the past 4 years.  If she has been taking Boniva since 2009  we should consider a one year holiday from it and  Repeat DEXA in 1 year  And  consider alternative therapy then if there is a significant change. Continue calcium, vitamin d and weight bearing exercise on a regular basis.

## 2016-11-23 ENCOUNTER — Ambulatory Visit (INDEPENDENT_AMBULATORY_CARE_PROVIDER_SITE_OTHER): Payer: Medicare Other | Admitting: *Deleted

## 2016-11-23 DIAGNOSIS — E538 Deficiency of other specified B group vitamins: Secondary | ICD-10-CM

## 2016-11-23 MED ORDER — CYANOCOBALAMIN 1000 MCG/ML IJ SOLN
1000.0000 ug | Freq: Once | INTRAMUSCULAR | Status: AC
  Administered 2016-11-23: 1000 ug via INTRAMUSCULAR

## 2016-11-23 NOTE — Progress Notes (Addendum)
Patient presented for B 12 injection to left deltoid, patient tolerated injection well.  Reviewed.  Dr Nicki Reaper

## 2016-11-24 ENCOUNTER — Ambulatory Visit: Payer: Medicare Other

## 2016-11-30 ENCOUNTER — Other Ambulatory Visit (INDEPENDENT_AMBULATORY_CARE_PROVIDER_SITE_OTHER): Payer: Medicare Other

## 2016-11-30 DIAGNOSIS — E7800 Pure hypercholesterolemia, unspecified: Secondary | ICD-10-CM | POA: Diagnosis not present

## 2016-11-30 DIAGNOSIS — D649 Anemia, unspecified: Secondary | ICD-10-CM

## 2016-11-30 DIAGNOSIS — E538 Deficiency of other specified B group vitamins: Secondary | ICD-10-CM | POA: Diagnosis not present

## 2016-11-30 DIAGNOSIS — E038 Other specified hypothyroidism: Secondary | ICD-10-CM

## 2016-11-30 LAB — LIPID PANEL
CHOL/HDL RATIO: 4
Cholesterol: 198 mg/dL (ref 0–200)
HDL: 50.3 mg/dL (ref >39.00)
LDL Cholesterol: 122 mg/dL — ABNORMAL HIGH (ref 0–99)
NONHDL: 147.21
TRIGLYCERIDES: 124 mg/dL (ref 0.0–149.0)
VLDL: 24.8 mg/dL (ref 0.0–40.0)

## 2016-11-30 LAB — FERRITIN: FERRITIN: 7.8 ng/mL — AB (ref 10.0–291.0)

## 2016-11-30 LAB — CBC WITH DIFFERENTIAL/PLATELET
BASOS ABS: 0.1 10*3/uL (ref 0.0–0.1)
Basophils Relative: 1.4 % (ref 0.0–3.0)
EOS PCT: 1.3 % (ref 0.0–5.0)
Eosinophils Absolute: 0.1 10*3/uL (ref 0.0–0.7)
HEMATOCRIT: 31.3 % — AB (ref 36.0–46.0)
Hemoglobin: 10.3 g/dL — ABNORMAL LOW (ref 12.0–15.0)
LYMPHS PCT: 35.5 % (ref 12.0–46.0)
Lymphs Abs: 1.8 10*3/uL (ref 0.7–4.0)
MCHC: 32.9 g/dL (ref 30.0–36.0)
MCV: 89 fl (ref 78.0–100.0)
Monocytes Absolute: 0.4 10*3/uL (ref 0.1–1.0)
Monocytes Relative: 7 % (ref 3.0–12.0)
NEUTROS ABS: 2.8 10*3/uL (ref 1.4–7.7)
Neutrophils Relative %: 54.8 % (ref 43.0–77.0)
Platelets: 319 10*3/uL (ref 150.0–400.0)
RBC: 3.52 Mil/uL — AB (ref 3.87–5.11)
RDW: 16.5 % — ABNORMAL HIGH (ref 11.5–15.5)
WBC: 5.1 10*3/uL (ref 4.0–10.5)

## 2016-11-30 LAB — IRON AND TIBC
%SAT: 20 % (ref 11–50)
Iron: 83 ug/dL (ref 45–160)
TIBC: 408 ug/dL (ref 250–450)
UIBC: 325 ug/dL (ref 125–400)

## 2016-11-30 LAB — TSH: TSH: 0.5 u[IU]/mL (ref 0.35–4.50)

## 2016-12-03 ENCOUNTER — Telehealth: Payer: Self-pay

## 2016-12-03 NOTE — Telephone Encounter (Signed)
LMTCB regarding lab results.  

## 2016-12-03 NOTE — Telephone Encounter (Signed)
-----   Message from Crecencio Mc, MD sent at 12/03/2016  1:16 PM EDT ----- Your thyroid.cholesterol, liver and kidney function are normal.  You\ anemia is improving . You do not need any medication changes. Please plan to repeat the labs in 6 months.    Regards,   Dr. Derrel Nip

## 2016-12-03 NOTE — Telephone Encounter (Signed)
Gave patient her lab results. thanks

## 2016-12-09 DIAGNOSIS — H5213 Myopia, bilateral: Secondary | ICD-10-CM | POA: Diagnosis not present

## 2016-12-15 ENCOUNTER — Telehealth: Payer: Self-pay | Admitting: Internal Medicine

## 2016-12-15 NOTE — Telephone Encounter (Signed)
Left pt message asking to call Allison back directly at [phone] to schedule AWV. Thanks! °

## 2016-12-28 ENCOUNTER — Ambulatory Visit: Payer: Medicare Other

## 2016-12-28 ENCOUNTER — Ambulatory Visit (INDEPENDENT_AMBULATORY_CARE_PROVIDER_SITE_OTHER): Payer: Medicare Other

## 2016-12-28 ENCOUNTER — Other Ambulatory Visit: Payer: Self-pay

## 2016-12-28 VITALS — BP 128/80 | HR 60 | Temp 97.8°F | Resp 12 | Ht 58.5 in | Wt 143.4 lb

## 2016-12-28 DIAGNOSIS — E538 Deficiency of other specified B group vitamins: Secondary | ICD-10-CM

## 2016-12-28 DIAGNOSIS — Z0000 Encounter for general adult medical examination without abnormal findings: Secondary | ICD-10-CM | POA: Diagnosis not present

## 2016-12-28 MED ORDER — CYANOCOBALAMIN 1000 MCG/ML IJ SOLN
1000.0000 ug | Freq: Once | INTRAMUSCULAR | Status: AC
  Administered 2016-12-28: 1000 ug via INTRAMUSCULAR

## 2016-12-28 NOTE — Progress Notes (Signed)
Care was provided under my supervision. I agree with the management as indicated in the note.  Zahari Xiang DO  

## 2016-12-28 NOTE — Patient Instructions (Addendum)
[name] , Thank you for taking time to come for your Medicare Wellness Visit. I appreciate your ongoing commitment to your health goals. Please review the following plan we discussed and let me know if I can assist you in the future.   Follow up with Dr. Derrel Nip as needed.    Bring a copy of your Brethren and/or Living Will to be scanned into chart.  Have a great day!  These are the goals we discussed: Goals    . Increase physical activity          Swim Walk for exercise       This is a list of the screening recommended for you and due dates:  Health Maintenance  Topic Date Due  . Flu Shot  04/20/2017  . Mammogram  11/09/2018  . Colon Cancer Screening  11/16/2019  . Tetanus Vaccine  06/18/2024  . DEXA scan (bone density measurement)  Completed  .  Hepatitis C: One time screening is recommended by Center for Disease Control  (CDC) for  adults born from [birth year] through 1964.   Completed  . Pneumonia vaccines  Completed      Fall Prevention in the Home Falls can cause injuries. They can happen to people of all ages. There are many things you can do to make your home safe and to help prevent falls. What can I do on the outside of my home?  Regularly fix the edges of walkways and driveways and fix any cracks.  Remove anything that might make you trip as you walk through a door, such as a raised step or threshold.  Trim any bushes or trees on the path to your home.  Use bright outdoor lighting.  Clear any walking paths of anything that might make someone trip, such as rocks or tools.  Regularly check to see if handrails are loose or broken. Make sure that both sides of any steps have handrails.  Any raised decks and porches should have guardrails on the edges.  Have any leaves, snow, or ice cleared regularly.  Use sand or salt on walking paths during winter.  Clean up any spills in your garage right away. This includes oil or grease spills. What  can I do in the bathroom?  Use night lights.  Install grab bars by the toilet and in the tub and shower. Do not use towel bars as grab bars.  Use non-skid mats or decals in the tub or shower.  If you need to sit down in the shower, use a plastic, non-slip stool.  Keep the floor dry. Clean up any water that spills on the floor as soon as it happens.  Remove soap buildup in the tub or shower regularly.  Attach bath mats securely with double-sided non-slip rug tape.  Do not have throw rugs and other things on the floor that can make you trip. What can I do in the bedroom?  Use night lights.  Make sure that you have a light by your bed that is easy to reach.  Do not use any sheets or blankets that are too big for your bed. They should not hang down onto the floor.  Have a firm chair that has side arms. You can use this for support while you get dressed.  Do not have throw rugs and other things on the floor that can make you trip. What can I do in the kitchen?  Clean up any spills right away.  Avoid walking on wet floors.  Keep items that you use a lot in easy-to-reach places.  If you need to reach something above you, use a strong step stool that has a grab bar.  Keep electrical cords out of the way.  Do not use floor polish or wax that makes floors slippery. If you must use wax, use non-skid floor wax.  Do not have throw rugs and other things on the floor that can make you trip. What can I do with my stairs?  Do not leave any items on the stairs.  Make sure that there are handrails on both sides of the stairs and use them. Fix handrails that are broken or loose. Make sure that handrails are as long as the stairways.  Check any carpeting to make sure that it is firmly attached to the stairs. Fix any carpet that is loose or worn.  Avoid having throw rugs at the top or bottom of the stairs. If you do have throw rugs, attach them to the floor with carpet tape.  Make sure  that you have a light switch at the top of the stairs and the bottom of the stairs. If you do not have them, ask someone to add them for you. What else can I do to help prevent falls?  Wear shoes that:  Do not have high heels.  Have rubber bottoms.  Are comfortable and fit you well.  Are closed at the toe. Do not wear sandals.  If you use a stepladder:  Make sure that it is fully opened. Do not climb a closed stepladder.  Make sure that both sides of the stepladder are locked into place.  Ask someone to hold it for you, if possible.  Clearly mark and make sure that you can see:  Any grab bars or handrails.  First and last steps.  Where the edge of each step is.  Use tools that help you move around (mobility aids) if they are needed. These include:  Canes.  Walkers.  Scooters.  Crutches.  Turn on the lights when you go into a dark area. Replace any light bulbs as soon as they burn out.  Set up your furniture so you have a clear path. Avoid moving your furniture around.  If any of your floors are uneven, fix them.  If there are any pets around you, be aware of where they are.  Review your medicines with your doctor. Some medicines can make you feel dizzy. This can increase your chance of falling. Ask your doctor what other things that you can do to help prevent falls. This information is not intended to replace advice given to you by your health care provider. Make sure you discuss any questions you have with your health care provider. Document Released: 07/03/2009 Document Revised: 02/12/2016 Document Reviewed: 10/11/2014 Elsevier Interactive Patient Education  2017 [employer].

## 2016-12-28 NOTE — Progress Notes (Signed)
Subjective:   Christine Manning is a 71 y.o. female who presents for Medicare Annual (Subsequent) preventive examination.  Review of Systems:  No ROS.  Medicare Wellness Visit.  Cardiac Risk Factors include: advanced age (>71men, >57 women);hypertension     Objective:     Vitals: BP 128/80 (BP Location: Right Arm, Patient Position: Sitting, Cuff Size: Normal)   Pulse 60   Temp 97.8 F (36.6 C) (Oral)   Resp 12   Ht 4' 10.5" (1.486 m)   Wt 143 lb 6.4 oz (65 kg)   SpO2 99%   BMI 29.46 kg/m   Body mass index is 29.46 kg/m.   Tobacco History  Smoking Status  . Never Smoker  Smokeless Tobacco  . Never Used     Counseling given: Not Answered   Past Medical History:  Diagnosis Date  . Asthma   . Colon polyp   . DDD (degenerative disc disease)   . Hyperlipidemia   . Hypertension   . Hyperthyroidism   . Personal history of DVT (deep vein thrombosis) 1980   x 2, pregnanacy and immobility induced    Past Surgical History:  Procedure Laterality Date  . ABDOMINAL HYSTERECTOMY  1991   secondary to abnormal PAPs  . BLADDER SUSPENSION  2003  . COLONOSCOPY  2016  . CORONARY ANGIOPLASTY  2013   arteries were clean .  Dr[facility] Rona [facility] at [facility]   . KNEE ARTHROSCOPY     Family History  Problem Relation Age of Onset  . Mental illness Mother     multi infarct dementia  . Stroke Mother   . Asthma Mother   . Mental illness Father     alzheimers dementia  . Asthma Brother   . Hernia Brother   . Asthma Son   . Colon cancer Maternal Aunt 21  . Breast cancer Maternal Aunt 65  . Uterine cancer Maternal Grandmother   . Colon polyps Sister   . Graves' disease Sister   . Asthma Daughter   . Colon cancer Cousin     40's  . Diabetes Neg Hx   . Kidney disease Neg Hx   . Esophageal cancer Neg Hx    History  Sexual Activity  . Sexual activity: Not Currently    Outpatient Encounter Prescriptions as of 12/28/2016  Medication Sig  . albuterol (PROVENTIL HFA;VENTOLIN HFA) 108  (90 Base) MCG/ACT inhaler Inhale 2 puffs into the lungs every 6 (six) hours as needed for wheezing or shortness of breath.  Marland Kitchen albuterol (PROVENTIL) (2.5 MG/3ML) 0.083% nebulizer solution Take 3 mLs (2.5 mg total) by nebulization every 6 (six) hours as needed for wheezing or shortness of breath.  Marland Kitchen aspirin 81 MG tablet Take 81 mg by mouth daily.  . Cyanocobalamin (VITAMIN B-12 IJ) Inject 1 each as directed every 28 (twenty-eight) days.  . INFLUENZA VAC SPLIT HIGH-DOSE IM Inject 1 each into the muscle once.  . Lactobacillus-Inulin ([place]) CAPS Take 1 capsule by mouth daily.  Marland Kitchen levothyroxine (SYNTHROID, LEVOTHROID) 125 MCG tablet Take 1 tablet (125 mcg total) by mouth daily before breakfast.  . lisinopril (PRINIVIL,ZESTRIL) 40 MG tablet Take 1 tablet (40 mg total) by mouth daily.  Marland Kitchen loratadine (CLARITIN) 10 MG tablet Take 1 tablet (10 mg total) by mouth daily.  . metoprolol tartrate (LOPRESSOR) 25 MG tablet Take 1 tablet (25 mg total) by mouth 2 (two) times daily.  . mometasone (NASONEX) 50 MCG/ACT nasal spray Place 2 sprays into the nose daily.  Marland Kitchen  Multiple Vitamin (MULTIVITAMIN) tablet Take 1 tablet by mouth daily.  . ondansetron (ZOFRAN) 4 MG tablet Take 1 tablet (4 mg total) by mouth every 8 (eight) hours as needed for nausea or vomiting.  . pantoprazole (PROTONIX) 40 MG tablet Take 40 mg by mouth daily as needed.   . simvastatin (ZOCOR) 40 MG tablet Take 1 tablet (40 mg total) by mouth every evening.  . Vitamin D, Ergocalciferol, (DRISDOL) 50000 units CAPS capsule Take 1 capsule (50,000 Units total) by mouth every 7 (seven) days.  Marland Kitchen zoster vaccine live, PF, (ZOSTAVAX) 23300 UNT/0.65ML injection Inject 19,400 Units into the skin once.  . zoster vaccine live, PF, (ZOSTAVAX) 76226 UNT/0.65ML injection Inject 19,400 Units into the skin once.  . [DISCONTINUED] dicyclomine (BENTYL) 20 MG tablet Take 1 tablet (20 mg total) by mouth 4 (four) times daily -  before meals and at  bedtime.  . [DISCONTINUED] hydrOXYzine (ATARAX/VISTARIL) 25 MG tablet Take 25 mg by mouth 3 (three) times daily as needed for anxiety.   . [DISCONTINUED] ibandronate (BONIVA) 150 MG tablet Take 1 tablet (150 mg total) by mouth every 30 (thirty) days. Take in the morning with a full glass of water, on an empty stomach, and do not take anything else by mouth or lie down for the next 30 min.  . [DISCONTINUED] predniSONE (DELTASONE) 10 MG tablet 6 tablets all at once on Day 1,  Then taper by 1 tablet daily until gone  . [EXPIRED] cyanocobalamin ((VITAMIN B-12)) injection 1,000 mcg    No facility-administered encounter medications on file as of 12/28/2016.     Activities of Daily Living In your present state of health, do you have any difficulty performing the following activities: 12/28/2016  Hearing? N  Vision? N  Difficulty concentrating or making decisions? N  Walking or climbing stairs? Y  Dressing or bathing? N  Doing errands, shopping? N  Preparing Food and eating ? N  Using the Toilet? N  In the past six months, have you accidently leaked urine? Y  Do you have problems with loss of bowel control? Y  Managing your Medications? N  Managing your Finances? N  Housekeeping or managing your Housekeeping? N  Some recent data might be hidden    Patient Care Team: Crecencio Mc, MD as PCP - General (Internal Medicine)    Assessment:    This is a routine wellness examination for Christine Manning. The goal of the wellness visit is to assist the patient how to close the gaps in care and create a preventative care plan for the patient.   Taking calcium VIT D as appropriate/Osteoporosis risk reviewed.  Medications reviewed; taking without issues or barriers.  Safety issues reviewed; smoke detectors in the home. No firearms in the home.  Wears seatbelts when driving or riding with others. Patient does wear sunscreen or protective clothing when in direct sunlight. No violence in the  home.  Patient is alert, normal appearance, oriented to person/place/and time. Correctly identified the president of the [place], recall of 3/3 words, and performing simple calculations.  Patient displays appropriate judgement and can read correct time from watch face.  No new identified risk were noted.  No failures at ADL's or IADL's.   BMI- discussed the importance of a healthy diet, water intake and exercise. Educational material provided.   HTN- followed by PCP.  Eye- Visual acuity not assessed per patient preference since they have regular follow up with the ophthalmologist.  Wears corrective lenses.  Sleep patterns- Sleeps 6-7  hours at night.  Wakes feeling rested.  Per PCP order to administer monthly, B12 injection given R deltoid, tolerated well.  Health maintenance gaps- closed.  Patient Concerns: None at this time. Follow up with PCP as needed.  Exercise Activities and Dietary recommendations Current Exercise Habits: Home exercise routine, Type of exercise: walking, Time (Minutes): 20, Frequency (Times/Week): 4, Weekly Exercise (Minutes/Week): 80, Intensity: Mild  Goals    . Increase physical activity          Swim Walk for exercise      Fall Risk Fall Risk  12/28/2016 08/24/2016 09/10/2015 09/10/2015 09/06/2014  Falls in the past year? Yes Yes No No No  Number falls in past yr: 1 1 - - -  Injury with Fall? Yes Yes - - -  Follow up Falls prevention discussed;Education provided - - - -   Depression Screen PHQ 2/9 Scores 12/28/2016 08/24/2016 09/10/2015 09/10/2015  PHQ - 2 Score 0 0 1 1     Cognitive Function MMSE - Mini Mental State Exam 09/10/2015  Orientation to time 5  Orientation to Place 5  Registration 3  Attention/ Calculation 5  Recall 3  Language- name 2 objects 2  Language- repeat 1  Language- follow 3 step command 3  Language- read & follow direction 1  Write a sentence 1  Copy design 1  Total score 30     6CIT Screen 12/28/2016  What Year?  0 points  What month? 0 points  What time? 0 points  Count back from 20 0 points  Months in reverse 0 points  Repeat phrase 0 points  Total Score 0    Immunization History  Administered Date(s) Administered  . Influenza, High Dose Seasonal PF 08/19/2015, 05/25/2016  . Influenza,inj,Quad PF,36+ Mos 07/05/2013, 06/17/2014  . Influenza-Unspecified 07/21/2012  . PPD Test 06/17/2014  . Pneumococcal Conjugate-13 07/05/2013  . Pneumococcal Polysaccharide-23 09/10/2015  . Tdap 06/18/2014   Screening Tests Health Maintenance  Topic Date Due  . INFLUENZA VACCINE  04/20/2017  . MAMMOGRAM  11/09/2018  . COLONOSCOPY  11/16/2019  . TETANUS/TDAP  06/18/2024  . DEXA SCAN  Completed  . Hepatitis C Screening  Completed  . PNA vac Low Risk Adult  Completed      Plan:    End of life planning; Advance aging; Advanced directives discussed. Copy of current HCPOA/Living Will requested.    Medicare Attestation I have personally reviewed: The patient's medical and social history Their use of alcohol, tobacco or illicit drugs Their current medications and supplements The patient's functional ability including ADLs,fall risks, home safety risks, cognitive, and hearing and visual impairment Diet and physical activities Evidence for depression   The patient's weight, height, BMI, and visual acuity have been recorded in the chart.  I have made referrals and provided education to the patient based on review of the above and I have provided the patient with a written personalized care plan for preventive services.    During the course of the visit the patient was educated and counseled about the following appropriate screening and preventive services:   Vaccines to include Pneumoccal, Influenza, Hepatitis B, Td, Zostavax, HCV  Colorectal cancer screening-UTD  Bone density screening-UTD  Glaucoma screening-annual eye exam  Mammography-UTD  Nutrition counseling   Patient Instructions (the  written plan) was given to the patient.   Varney Biles, LPN  9/39/0300

## 2017-01-03 ENCOUNTER — Ambulatory Visit (INDEPENDENT_AMBULATORY_CARE_PROVIDER_SITE_OTHER): Payer: Medicare Other | Admitting: Internal Medicine

## 2017-01-03 ENCOUNTER — Encounter: Payer: Self-pay | Admitting: Internal Medicine

## 2017-01-03 VITALS — BP 138/80 | HR 67 | Temp 97.5°F | Wt 144.5 lb

## 2017-01-03 DIAGNOSIS — D126 Benign neoplasm of colon, unspecified: Secondary | ICD-10-CM

## 2017-01-03 DIAGNOSIS — D649 Anemia, unspecified: Secondary | ICD-10-CM

## 2017-01-03 DIAGNOSIS — R1011 Right upper quadrant pain: Secondary | ICD-10-CM | POA: Diagnosis not present

## 2017-01-03 DIAGNOSIS — G8929 Other chronic pain: Secondary | ICD-10-CM | POA: Diagnosis not present

## 2017-01-03 DIAGNOSIS — R109 Unspecified abdominal pain: Secondary | ICD-10-CM

## 2017-01-03 DIAGNOSIS — M25562 Pain in left knee: Secondary | ICD-10-CM

## 2017-01-03 DIAGNOSIS — K580 Irritable bowel syndrome with diarrhea: Secondary | ICD-10-CM

## 2017-01-03 LAB — POCT URINALYSIS DIPSTICK
Bilirubin, UA: NEGATIVE
Glucose, UA: NEGATIVE
LEUKOCYTES UA: NEGATIVE
NITRITE UA: NEGATIVE
PH UA: 5 (ref 5.0–8.0)
PROTEIN UA: NEGATIVE
Spec Grav, UA: >=1.03 — AB (ref 1.010–1.025)
Urobilinogen, UA: 0.2 E.U./dL

## 2017-01-03 NOTE — Progress Notes (Signed)
Pre visit review using our clinic review tool, if applicable. No additional management support is needed unless otherwise documented below in the visit note. 

## 2017-01-03 NOTE — Progress Notes (Signed)
Subjective:  Patient ID: Christine Manning, female    DOB: 07/25/1946  Age: 71 y.o. MRN: [id number]  CC: The primary encounter diagnosis was Chronic right flank pain. Diagnoses of RUQ pain, Left medial knee pain, Irritable bowel syndrome with diarrhea, Abdominal pain, chronic, right upper quadrant, Tubular adenoma of colon, and Normocytic anemia were also pertinent to this visit.  HPI Christine Manning Christine Manning presents for evaluation of rlq pain  And IBS  Last colonoscopy 2016 one sessile polyp     Right sided abd pain has been intermittent for the past 6 months,  at times sharp in character, not related to activity. May be related to eating,  Not sure  .  Denies fevers, nausea.  months.  Not occurring daily .  No history of kidney stones.  Has chronic urinary frequency with nocturia x 2,  No blood noted in urine.  History of  IBS with diarrhea predominance, bu the pain has no association with IBS flares.  . Still has gallbladder .    2) Left lower leg pain and swelling on the medial side, just below the knee  Present for the past 3 weeks.  Standing and walking  Ascending stairs hurts the most.    Knee always hurts.  Has djd.  Has history of DVT   And referral to [facility] discussed   Needs referral to gi for iBS management (diarrhe predominant)  Outpatient Medications Prior to Visit  Medication Sig Dispense Refill  . albuterol (PROVENTIL HFA;VENTOLIN HFA) 108 (90 Base) MCG/ACT inhaler Inhale 2 puffs into the lungs every 6 (six) hours as needed for wheezing or shortness of breath. 1 Inhaler 3  . albuterol (PROVENTIL) (2.5 MG/3ML) 0.083% nebulizer solution Take 3 mLs (2.5 mg total) by nebulization every 6 (six) hours as needed for wheezing or shortness of breath. 150 mL 1  . aspirin 81 MG tablet Take 81 mg by mouth daily.    . Cyanocobalamin (VITAMIN B-12 IJ) Inject 1 each as directed every 28 (twenty-eight) days.    . INFLUENZA VAC SPLIT HIGH-DOSE IM Inject 1 each into the muscle once.    .  Lactobacillus-Inulin ([place]) CAPS Take 1 capsule by mouth daily. 14 capsule 0  . levothyroxine (SYNTHROID, LEVOTHROID) 125 MCG tablet Take 1 tablet (125 mcg total) by mouth daily before breakfast. 30 tablet 1  . lisinopril (PRINIVIL,ZESTRIL) 40 MG tablet Take 1 tablet (40 mg total) by mouth daily. 30 tablet 0  . loratadine (CLARITIN) 10 MG tablet Take 1 tablet (10 mg total) by mouth daily. 30 tablet 0  . metoprolol tartrate (LOPRESSOR) 25 MG tablet Take 1 tablet (25 mg total) by mouth 2 (two) times daily. 30 tablet 0  . mometasone (NASONEX) 50 MCG/ACT nasal spray Place 2 sprays into the nose daily.    . Multiple Vitamin (MULTIVITAMIN) tablet Take 1 tablet by mouth daily.    . ondansetron (ZOFRAN) 4 MG tablet Take 1 tablet (4 mg total) by mouth every 8 (eight) hours as needed for nausea or vomiting. 20 tablet 0  . pantoprazole (PROTONIX) 40 MG tablet Take 40 mg by mouth daily as needed.     . simvastatin (ZOCOR) 40 MG tablet Take 1 tablet (40 mg total) by mouth every evening. 90 tablet 3  . Vitamin D, Ergocalciferol, (DRISDOL) 50000 units CAPS capsule Take 1 capsule (50,000 Units total) by mouth every 7 (seven) days. 4 capsule 3  . zoster vaccine live, PF, (ZOSTAVAX) 31497 UNT/0.65ML injection Inject 19,400 Units into  the skin once. 1 each 0  . zoster vaccine live, PF, (ZOSTAVAX) 40981 UNT/0.65ML injection Inject 19,400 Units into the skin once. 1 each 0   No facility-administered medications prior to visit.     Review of Systems;  Patient denies headache, fevers, malaise, unintentional weight loss, skin rash, eye pain, sinus congestion and sinus pain, sore throat, dysphagia,  hemoptysis , cough, dyspnea, wheezing, chest pain, palpitations, orthopnea, edema, abdominal pain, nausea, melena, diarrhea, constipation, flank pain, dysuria, hematuria,  numbness, tingling, seizures,  Focal weakness, Loss of consciousness,  Tremor, insomnia, depression, anxiety, and suicidal ideation.       Objective:  BP 138/80   Pulse 67   Temp 97.5 F (36.4 C) (Oral)   Wt 144 lb 8 oz (65.5 kg)   SpO2 97%   BMI 29.69 kg/m    BP Readings from Last 3 Encounters:  01/03/17 138/80  12/28/16 128/80  09/15/16 120/84    Wt Readings from Last 3 Encounters:  01/03/17 144 lb 8 oz (65.5 kg)  12/28/16 143 lb 6.4 oz (65 kg)  09/15/16 148 lb (67.1 kg)    General appearance: alert, cooperative and appears stated age Ears: normal TM's and external ear canals both ears Throat: lips, mucosa, and tongue normal; teeth and gums normal Neck: no adenopathy, no carotid bruit, supple, symmetrical, trachea midline and thyroid not enlarged, symmetric, no tenderness/mass/nodules Back: symmetric, no curvature. ROM normal. No CVA tenderness. Lungs: clear to auscultation bilaterally Heart: regular rate and rhythm, S1, S2 normal, no murmur, click, rub or gallop Abdomen: soft, tender to deep palpation in the RUQ,  No guarding ; bowel sounds normal; no masses,  no organomegaly Pulses: 2+ and symmetric Skin: Skin color, texture, turgor normal. No rashes or lesions Lymph nodes: Cervical, supraclavicular, and axillary nodes normal. MSK; LEFT LE without  calf swelling or tenderness, no ecchymoses, no knee effusion.    No results found for: HGBA1C  Lab Results  Component Value Date   CREATININE 1.16 09/15/2016   CREATININE 1.06 02/02/2016   CREATININE 1.05 09/10/2015    Lab Results  Component Value Date   WBC 5.1 11/30/2016   HGB 10.3 (L) 11/30/2016   HCT 31.3 (L) 11/30/2016   PLT 319.0 11/30/2016   GLUCOSE 102 (H) 09/15/2016   CHOL 198 11/30/2016   TRIG 124.0 11/30/2016   HDL 50.30 11/30/2016   LDLDIRECT 165.0 09/10/2015   LDLCALC 122 (H) 11/30/2016   ALT 9 09/15/2016   AST 19 09/15/2016   NA 141 09/15/2016   K 4.1 09/15/2016   CL 105 09/15/2016   CREATININE 1.16 09/15/2016   BUN 12 09/15/2016   CO2 28 09/15/2016   TSH 0.50 11/30/2016   MICROALBUR 2.5 (H) 07/05/2013    Dg Bone  Density  Result Date: 11/09/2016 EXAM: DUAL X-RAY ABSORPTIOMETRY (DXA) FOR BONE MINERAL DENSITY IMPRESSION: Dear Dr. Crecencio Mc, Your patient Nolyn Eilert completed a BMD test on 11/09/2016 using the [place] (analysis version: 14.10) manufactured by [employer]. The following summarizes the results of our evaluation. PATIENT BIOGRAPHICAL: Name: Leeba, Barbe Patient ID: [id number] Birth Date: [birth year] Height: 59.5 in. Gender: Female Exam Date: 11/09/2016 Weight: 144.8 lbs. Indications: Family Hx of Osteoporosis, Hypothyroid, Hysterectomy, Long term prednisone use, Oophorectomy Unilateral, Parent Hip Fracture, Postmenopausal Fractures: Treatments: Albuterol, ASPRIN 81 MG, Advair Inhaler, Vit D, Boniva, Synthroid, prednisone ASSESSMENT: The BMD measured at Femur Neck Left is 0.761 g/cm2 with a T-score of -2.0. This patient is considered osteopenic according  to [facility]) criteria. L4 was excluded due to advanced degenerative changes. Patient is not a candidate for FRAX due to [place]. Site [place] Measured Measured WHO Young Adult BMD Date       Age      Classification T-score AP Spine L1-L3 11/09/2016 69.1 Osteopenia -2.0 0.936 g/cm2 AP Spine L1-L3 11/14/2013 66.1 Osteopenia -1.8 0.957 g/cm2 AP Spine L1-L3 11/11/2011 64.1 Osteopenia -1.9 0.949 g/cm2 DualFemur Neck Left 11/09/2016 69.1 Osteopenia -2.0 0.761 g/cm2 DualFemur Neck Left 11/14/2013 66.1 Osteopenia -2.2 0.739 g/cm2 DualFemur Neck Left 11/11/2011 64.1 Osteopenia -2.1 0.750 g/cm2 World Health Organization [facility]) criteria for post-menopausal, Caucasian Women: Normal:       T-score at or above -1 SD Osteopenia:   T-score between -1 and -2.5 SD Osteoporosis: T-score at or below -2.5 SD RECOMMENDATIONS: [place] recommends that FDA-approved medical therapies be considered in postmenopausal women and men age 18 or older with a: 1. Hip or vertebral (clinical or morphometric) fracture. 2.  T-score of < -2.5 at the spine or hip. 3. Ten-year fracture probability by FRAX of 3% or greater for hip fracture or 20% or greater for major osteoporotic fracture. All treatment decisions require clinical judgment and consideration of individual patient factors, including patient preferences, co-morbidities, previous drug use, risk factors not captured in the FRAX model (e.g. falls, vitamin D deficiency, increased bone turnover, interval significant decline in bone density) and possible under - or over-estimation of fracture risk by FRAX. All patients should ensure an adequate intake of dietary calcium (1200 mg/d) and vitamin D (800 IU daily) unless contraindicated. FOLLOW-UP: People with diagnosed cases of osteoporosis or at high risk for fracture should have regular bone mineral density tests. For patients eligible for Medicare, routine testing is allowed once every 2 years. The testing frequency can be increased to one year for patients who have rapidly progressing disease, those who are receiving or discontinuing medical therapy to restore bone mass, or have additional risk factors. I have reviewed this report, and agree with the above findings. [facility]iology Electronically Signed   By: Lahoma Crocker M.D.   On: 11/09/2016 10:13   Mm Screening Breast Tomo Bilateral  Result Date: 11/09/2016 CLINICAL DATA:  Screening. EXAM: 2D DIGITAL SCREENING BILATERAL MAMMOGRAM WITH CAD AND ADJUNCT TOMO COMPARISON:  Previous exam(s). ACR Breast Density Category b: There are scattered areas of fibroglandular density. FINDINGS: There are no findings suspicious for malignancy. Images were processed with CAD. IMPRESSION: No mammographic evidence of malignancy. A result letter of this screening mammogram will be mailed directly to the patient. RECOMMENDATION: Screening mammogram in one year. (Code:SM-B-01Y) BI-RADS CATEGORY  1: Negative. Electronically Signed   By: Ammie Ferrier M.D.   On: 11/09/2016 12:45     Assessment & Plan:   Problem List Items Addressed This Visit    Abdominal pain, chronic, right upper quadrant    She is tender in the ruq  .  No evidence of stones by urinalysis .  RUQ ultrasound      Irritable bowel syndrome    Diarrhea predominant.  She has no warning signs or symptoms and is up to date on colon cancer screening  Requesting referral to GI for management.        Relevant Orders   Ambulatory referral to Gastroenterology   Normocytic anemia    Attributed to underactive thyroid, improving with normalization of thyroid function .  normal iron studies, folate and supplemented B12   Lab Results  Component Value Date  WBC 5.1 11/30/2016   HGB 10.3 (L) 11/30/2016   HCT 31.3 (L) 11/30/2016   MCV 89.0 11/30/2016   PLT 319.0 11/30/2016   Lab Results  Component Value Date   TSH 0.50 11/30/2016   Lab Results  Component Value Date   MPNTIRWE31 540 09/15/2016   No results found for: FOLATE       Tubular adenoma of colon    Found during 2016 colonoscopy .  5 yr follow up advised.        Other Visit Diagnoses    Chronic right flank pain    -  Primary   Relevant Orders   Urine Microscopic Only (Completed)   Urine culture (Completed)   RUQ pain       Relevant Orders   US Abdomen Limited RUQ   POCT Urinalysis Dipstick (Completed)   Left medial knee pain       Relevant Orders   VAS [place] LOWER EXTREMITY VENOUS (DVT)      I am having Ms. Gadison maintain her mometasone, pantoprazole, aspirin, [employer], zoster vaccine live (PF), multivitamin, loratadine, zoster vaccine live (PF), ondansetron, INFLUENZA VAC SPLIT HIGH-DOSE IM, Cyanocobalamin (VITAMIN B-12 IJ), albuterol, albuterol, Vitamin D (Ergocalciferol), simvastatin, lisinopril, metoprolol tartrate, and levothyroxine.  No orders of the defined types were placed in this encounter.   There are no discontinued medications.  Follow-up: No Follow-up on file.   Crecencio Mc, MD

## 2017-01-04 LAB — URINALYSIS, MICROSCOPIC ONLY: RBC / HPF: NONE SEEN (ref 0–?)

## 2017-01-05 LAB — URINE CULTURE

## 2017-01-05 NOTE — Assessment & Plan Note (Signed)
Diarrhea predominant.  She has no warning signs or symptoms and is up to date on colon cancer screening  Requesting referral to GI for management.

## 2017-01-05 NOTE — Assessment & Plan Note (Signed)
Found during 2016 colonoscopy .  5 yr follow up advised.

## 2017-01-05 NOTE — Assessment & Plan Note (Signed)
She is tender in the ruq  .  No evidence of stones by urinalysis .  RUQ ultrasound

## 2017-01-05 NOTE — Assessment & Plan Note (Addendum)
Attributed to underactive thyroid, improving with normalization of thyroid function .  normal iron studies, folate and supplemented B12   Lab Results  Component Value Date   WBC 5.1 11/30/2016   HGB 10.3 (L) 11/30/2016   HCT 31.3 (L) 11/30/2016   MCV 89.0 11/30/2016   PLT 319.0 11/30/2016   Lab Results  Component Value Date   TSH 0.50 11/30/2016   Lab Results  Component Value Date   VITAMINB12 346 09/15/2016   No results found for: FOLATE

## 2017-01-06 ENCOUNTER — Telehealth: Payer: Self-pay | Admitting: Internal Medicine

## 2017-01-06 NOTE — Telephone Encounter (Signed)
Please call pt around 2pm  Pt contact [phone]

## 2017-01-06 NOTE — Telephone Encounter (Signed)
Pt called back returning your call. Thank you! °

## 2017-01-07 NOTE — Telephone Encounter (Signed)
Patient will be at [phone] this afternoon and requested a call

## 2017-01-10 ENCOUNTER — Ambulatory Visit
Admission: RE | Admit: 2017-01-10 | Discharge: 2017-01-10 | Disposition: A | Discharge status: Home / Self Care | Payer: Medicare Other | Source: Ambulatory Visit | Attending: Internal Medicine | Admitting: Internal Medicine

## 2017-01-10 DIAGNOSIS — R1011 Right upper quadrant pain: Secondary | ICD-10-CM | POA: Diagnosis not present

## 2017-01-10 DIAGNOSIS — R109 Unspecified abdominal pain: Secondary | ICD-10-CM | POA: Diagnosis not present

## 2017-01-11 NOTE — Telephone Encounter (Signed)
Pt called back and was given lab results.  

## 2017-01-11 NOTE — Telephone Encounter (Signed)
LVTCB

## 2017-01-13 ENCOUNTER — Telehealth: Payer: Self-pay | Admitting: Internal Medicine

## 2017-01-13 ENCOUNTER — Encounter: Payer: Self-pay | Admitting: Gastroenterology

## 2017-01-13 ENCOUNTER — Ambulatory Visit (INDEPENDENT_AMBULATORY_CARE_PROVIDER_SITE_OTHER): Payer: Medicare Other

## 2017-01-13 ENCOUNTER — Ambulatory Visit (INDEPENDENT_AMBULATORY_CARE_PROVIDER_SITE_OTHER): Payer: Medicare Other | Admitting: Gastroenterology

## 2017-01-13 VITALS — BP 128/78 | HR 80 | Ht 58.66 in | Wt 144.1 lb

## 2017-01-13 DIAGNOSIS — R159 Full incontinence of feces: Secondary | ICD-10-CM | POA: Diagnosis not present

## 2017-01-13 DIAGNOSIS — K580 Irritable bowel syndrome with diarrhea: Secondary | ICD-10-CM | POA: Diagnosis not present

## 2017-01-13 DIAGNOSIS — M25562 Pain in left knee: Secondary | ICD-10-CM | POA: Diagnosis not present

## 2017-01-13 NOTE — Patient Instructions (Signed)
You have been scheduled to have an anorectal manometry at [facility] on 01/24/2017 at 10:30am. Please arrive 30 minutes prior to your appointment time for registration (1st floor of the hospital-admissions).  Please make certain to use 1 Fleets enema 2 hours prior to coming for your appointment. You can purchase Fleets enemas from the laxative section at your drug store. You should not eat anything during the two hours prior to the procedure. You may take regular medications with small sips of water at least 2 hours prior to the study.  Anorectal manometry is a test performed to evaluate patients with constipation or fecal incontinence. This test measures the pressures of the anal sphincter muscles, the sensation in the rectum, and the neural reflexes that are needed for normal bowel movements.  THE PROCEDURE The test takes approximately 30 minutes to 1 hour. You will be asked to change into a hospital gown. A technician or nurse will explain the procedure to you, take a brief health history, and answer any questions you may have. The patient then lies on his or her left side. A small, flexible tube, about the size of a thermometer, with a balloon at the end is inserted into the rectum. The catheter is connected to a machine that measures the pressure. During the test, the small balloon attached to the catheter may be inflated in the rectum to assess the normal reflex pathways. The nurse or technician may also ask the person to squeeze, relax, and push at various times. The anal sphincter muscle pressures are measured during each of these maneuvers. To squeeze, the patient tightens the sphincter muscles as if trying to prevent anything from coming out. To push or bear down, the patient strains down as if trying to have a bowel movement.  VSL 112 #3 units 1 capsule per day  Lactose-Free Diet, Adult If you have lactose intolerance, you are not able to digest lactose. Lactose is a natural sugar  found mainly in milk and milk products. You may need to avoid all foods and beverages that contain lactose. A lactose-free diet can help you do this. What do I need to know about this diet?  Do not consume foods, beverages, vitamins, minerals, or medicines with lactose. Read ingredients lists carefully.  Look for the words "lactose-free" on labels.  Use lactase enzyme drops or tablets as directed by your health care provider.  Use lactose-free milk or a milk alternative, such as soy milk, for drinking and cooking.  Make sure you get enough calcium and vitamin D in your diet. A lactose-free eating plan can be lacking in these important nutrients.  Take calcium and vitamin D supplements as directed by your health care provider. Talk to your provider about supplements if you are not able to get enough calcium and vitamin D from food. Which foods have lactose? Lactose is found in:  Milk and foods made from milk.  Yogurt.  Cheese.  Butter.  Margarine.  Sour cream.  Cream.  Whipped toppings and nondairy creamers.  Ice cream and other milk-based desserts. Lactose is also found in foods or products made with milk or milk ingredients. To find out whether a food contains milk or a milk ingredient, look at the ingredients list. Avoid foods with the statement "May contain milk" and foods that contain:  Butter.  Cream.  Milk.  Milk solids.  Milk powder.  Whey.  Curd.  Caseinate.  Lactose.  Lactalbumin.  Lactoglobulin. What are some alternatives to milk and  foods made with milk products?  Lactose-free milk.  Soy milk with added calcium and vitamin D.  Almond, coconut, or rice milk with added calcium and vitamin D. Note that these are low in protein.  Soy products, such as soy yogurt, soy cheese, soy ice cream, and soy-based sour cream. Which foods can I eat? Grains  Breads and rolls made without milk, such as [place], [place], or [place] bread, bagels, pita, and  [employer]. Corn tortillas, corn meal, grits, and polenta. Crackers without lactose or milk solids, such as soda crackers and graham crackers. Cooked or dry cereals without lactose or milk solids. Pasta, quinoa, couscous, barley, oats, bulgur, farro, rice, wild rice, or other grains prepared without milk or lactose. Plain popcorn. Vegetables  Fresh, frozen, and canned vegetables without cheese, cream, or butter sauces. Fruits  All fresh, canned, frozen, or dried fruits that are not processed with lactose. Meats and Other Protein Sources  Plain beef, chicken, fish, [place], lamb, veal, pork, wild game, or ham. Kosher-prepared meat products. Strained or junior meats that do not contain milk. Eggs. Soy meat substitutes. Beans, lentils, and hummus. Tofu. Nuts and seeds. Peanut or other nut butters without lactose. Soups, casseroles, and mixed dishes without cheese, cream, or milk. Dairy  Lactose-free milk. Soy, rice, or almond milk with added calcium and vitamin D. Soy cheese and yogurt. Beverages  Carbonated drinks. Tea. Coffee, freeze-dried coffee, and some instant coffees. Fruit and vegetable juices. Condiments  Soy sauce. Carob powder. Olives. Gravy made with water. Baker's cocoa. Angie Fava. Pure seasonings and spices. Ketchup. Mustard. Bouillon. Broth. Sweets and Desserts  Water and fruit ices. Gelatin. Cookies, pies, or cakes made from allowed ingredients, such as angel food cake. Pudding made with water or a milk substitute. Lactose-free tofu desserts. Soy, coconut milk, or rice-milk-based frozen desserts. Sugar. Honey. Jam, jelly, and marmalade. Molasses. Pure sugar candy. Dark chocolate without milk. Marshmallows. Fats and Oils  Margarines and salad dressings that do not contain milk. Berniece Salines. Vegetable oils. Shortening. Mayonnaise. Soy or coconut-based cream. The items listed above may not be a complete list of recommended foods or beverages. Contact your dietitian for more options.  Which foods are  not recommended? Grains  Breads and rolls that contain milk. Toaster pastries. Muffins, biscuits, waffles, cornbread, and pancakes. These can be prepared at home, commercial, or from mixes. Sweet rolls, donuts, English muffins, fry bread, lefse, flour tortillas with lactose, or [place] toast made with milk or milk ingredients. Crackers that contain lactose. Corn curls. Cooked or dry cereals with lactose. Vegetables  Creamed or breaded vegetables. Vegetables in a cheese or butter sauce or with lactose-containing margarines. Instant potatoes. [place] fries. Scalloped or au gratin potatoes. Fruits  None. Meats and Other Protein Sources  Scrambled eggs, omelets, and souffles that contain milk. Creamed or breaded meat, fish, chicken, or [place]. Sausage products, such as wieners and liver sausage. Cold cuts that contain milk solids. Cheese, cottage cheese, ricotta cheese, and cheese spreads. Lasagna and macaroni and cheese. Pizza. Peanut or other nut butters with added milk solids. Casseroles or mixed dishes containing milk or cheese. Dairy  All dairy products, including milk, goat's milk, buttermilk, kefir, acidophilus milk, flavored milk, evaporated milk, condensed milk, dulce de [place], eggnog, yogurt, cheese, and cheese spreads. Beverages  Hot chocolate. Cocoa with lactose. Instant iced teas. Powdered fruit drinks. Smoothies made with milk or yogurt. Condiments  Chewing gum that has lactose. Cocoa that has lactose. Spice blends if they contain milk products. Artificial sweeteners that contain lactose.  Nondairy creamers. Sweets and Desserts  Ice cream, ice milk, gelato, sherbet, and frozen yogurt. Custard, pudding, and mousse. Cake, cream pies, cookies, and other desserts containing milk, cream, cream cheese, or milk chocolate. Pie crust made with milk-containing margarine or butter. Reduced-calorie desserts made with a sugar substitute that contains lactose. Toffee and butterscotch. Milk, white, or dark  chocolate that contains milk. Fudge. Caramel. Fats and Oils  Margarines and salad dressings that contain milk or cheese. Cream. Half and half. Cream cheese. Sour cream. Chip dips made with sour cream or yogurt. The items listed above may not be a complete list of foods and beverages to avoid. Contact your dietitian for more information.  Am I getting enough calcium? Calcium is found in many foods that contain lactose and is important for bone health. The amount of calcium you need depends on your age:  Adults younger than 50 years: 1000 mg of calcium a day.  Adults older than 50 years: 1200 mg of calcium a day. If you are not getting enough calcium, other calcium sources include:  Orange juice with calcium added. There are 300-350 mg of calcium in 1 cup of orange juice.  Sardines with edible bones. There are 325 mg of calcium in 3 oz of sardines.  Calcium-fortified soy milk. There are 300-400 mg of calcium in 1 cup of calcium-fortified soy milk.  Calcium-fortified rice or almond milk. There are 300 mg of calcium in 1 cup of calcium-fortified rice or almond milk.  Canned salmon with edible bones. There are 180 mg of calcium in 3 oz of canned salmon with edible bones.  Calcium-fortified breakfast cereals. There are [phone] mg of calcium in calcium-fortified breakfast cereals.  Tofu set with calcium sulfate. There are 250 mg of calcium in  cup of tofu set with calcium sulfate.  Spinach, cooked. There are 145 mg of calcium in  cup of cooked spinach.  Edamame, cooked. There are 130 mg of calcium in  cup of cooked edamame.  Collard greens, cooked. There are 125 mg of calcium in  cup of cooked collard greens.  Kale, frozen or cooked. There are 90 mg of calcium in  cup of cooked or frozen kale.  Almonds. There are 95 mg of calcium in  cup of almonds.  Broccoli, cooked. There are 60 mg of calcium in 1 cup of cooked broccoli. This information is not intended to replace advice given  to you by your health care provider. Make sure you discuss any questions you have with your health care provider. Document Released: 02/26/2002 Document Revised: 02/12/2016 Document Reviewed: 12/07/2013 Elsevier Interactive Patient Education  2017 [employer].

## 2017-01-13 NOTE — Progress Notes (Signed)
Christine Manning    [id number]    1945/11/13  Primary Care Physician:TULLO, Aris Everts, MD  Referring Physician: Crecencio Mc, MD [address] [place], [place] [address]  Chief complaint:  Fecal incontinence  HPI: 71 year old African-American previously followed by Dr. Olevia Perches, last seen in office 07/26/2014 here with complaints of diarrhea and fecal incontinence. She has bowel movement every day but has fecal urgency, incontinence to gas and stool throughout the day. She has to wear a pad and sometimes has to change her clothes multiple times. Patient is embarrassed and distressed by her symptoms. She feels her fecal incontinence episodes are getting more frequent. She had 2 vaginal deliveries and a C-section. She does not remember any injuries during delivery but does recall that her babies were 9-10 pounds each and she may have had a large tear with her first child during delivery She also has urinary and had bladder sling surgery. Denies any nausea, vomiting, abdominal pain, melena or bright red blood per rectum  Colonoscopy February 2016: 7 mm sessile polyp in transverse colon removed. Tubular adenoma on pathology.   Outpatient Encounter Prescriptions as of 01/13/2017  Medication Sig  . albuterol (PROVENTIL HFA;VENTOLIN HFA) 108 (90 Base) MCG/ACT inhaler Inhale 2 puffs into the lungs every 6 (six) hours as needed for wheezing or shortness of breath.  Marland Kitchen albuterol (PROVENTIL) (2.5 MG/3ML) 0.083% nebulizer solution Take 3 mLs (2.5 mg total) by nebulization every 6 (six) hours as needed for wheezing or shortness of breath.  Marland Kitchen aspirin 81 MG tablet Take 81 mg by mouth daily.  . Cyanocobalamin (VITAMIN B-12 IJ) Inject 1 each as directed every 28 (twenty-eight) days.  . Lactobacillus-Inulin ([place]) CAPS Take 1 capsule by mouth daily.  Marland Kitchen levothyroxine (SYNTHROID, LEVOTHROID) 125 MCG tablet Take 1 tablet (125 mcg total) by mouth daily before  breakfast.  . lisinopril (PRINIVIL,ZESTRIL) 40 MG tablet Take 1 tablet (40 mg total) by mouth daily.  Marland Kitchen loratadine (CLARITIN) 10 MG tablet Take 1 tablet (10 mg total) by mouth daily.  . metoprolol tartrate (LOPRESSOR) 25 MG tablet Take 1 tablet (25 mg total) by mouth 2 (two) times daily.  . mometasone (NASONEX) 50 MCG/ACT nasal spray Place 2 sprays into the nose daily.  . Multiple Vitamin (MULTIVITAMIN) tablet Take 1 tablet by mouth daily.  . ondansetron (ZOFRAN) 4 MG tablet Take 1 tablet (4 mg total) by mouth every 8 (eight) hours as needed for nausea or vomiting. (Patient taking differently: Take 4 mg by mouth as needed for nausea or vomiting. )  . simvastatin (ZOCOR) 40 MG tablet Take 1 tablet (40 mg total) by mouth every evening.  . Vitamin D, Ergocalciferol, (DRISDOL) 50000 units CAPS capsule Take 1 capsule (50,000 Units total) by mouth every 7 (seven) days.  . [DISCONTINUED] INFLUENZA VAC SPLIT HIGH-DOSE IM Inject 1 each into the muscle once.  . [DISCONTINUED] pantoprazole (PROTONIX) 40 MG tablet Take 40 mg by mouth daily as needed.   . [DISCONTINUED] zoster vaccine live, PF, (ZOSTAVAX) 66063 UNT/0.65ML injection Inject 19,400 Units into the skin once.  . [DISCONTINUED] zoster vaccine live, PF, (ZOSTAVAX) 01601 UNT/0.65ML injection Inject 19,400 Units into the skin once.   No facility-administered encounter medications on file as of 01/13/2017.     Allergies as of 01/13/2017 - Review Complete 01/13/2017  Allergen Reaction Noted  . Codeine Hives and Shortness Of Breath 10/10/2012  . Latex Hives and Swelling 10/10/2012  . Augmentin [  amoxicillin-pot clavulanate] Other (See Comments) 10/10/2012  . Morphine Nausea Only 10/28/2014  . Morphine and related Nausea And Vomiting 08/13/2013    Past Medical History:  Diagnosis Date  . Asthma   . Colon polyp   . DDD (degenerative disc disease)   . Hyperlipidemia   . Hypertension   . Hyperthyroidism   . Personal history of DVT (deep vein  thrombosis) 1980   x 2, pregnanacy and immobility induced     Past Surgical History:  Procedure Laterality Date  . ABDOMINAL HYSTERECTOMY  1991   secondary to abnormal PAPs  . BLADDER SUSPENSION  2003  . COLONOSCOPY  2016  . CORONARY ANGIOPLASTY  2013   arteries were clean .  [facility] Rona [facility]   . KNEE ARTHROSCOPY      Family History  Problem Relation Age of Onset  . Mental illness Mother     multi infarct dementia  . Stroke Mother   . Asthma Mother   . Mental illness Father     alzheimers dementia  . Asthma Brother   . Hernia Brother   . Asthma Son   . Colon cancer Maternal Aunt 32  . Breast cancer Maternal Aunt 65  . Uterine cancer Maternal Grandmother   . Colon polyps Sister   . Graves' disease Sister   . Asthma Daughter   . Colon cancer Cousin     40's  . Diabetes Neg Hx   . Kidney disease Neg Hx   . Esophageal cancer Neg Hx     Social History   Social History  . Marital status: Divorced    Spouse name: N/A  . Number of children: 3  . Years of education: N/A   Occupational History  . Retired    Social History Main Topics  . Smoking status: Never Smoker  . Smokeless tobacco: Never Used  . Alcohol use No  . Drug use: No  . Sexual activity: Not Currently   Other Topics Concern  . 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       Review of systems: Review of Systems  Constitutional: Negative for fever and chills.Positive for lack of energy and weight gain  HENT: Positive for sinus problems and postnasal drip  Eyes: Negative for blurred vision.  Respiratory: Negative for cough, shortness of breath and wheezing.   Cardiovascular: Negative for chest pain and palpitations.  Gastrointestinal: as per HPI Genitourinary: Negative for dysuria, urgency, frequency and hematuria.  Musculoskeletal: Positive for myalgias, back pain and joint pain.  Skin: Negative for itching and rash.  Neurological: Negative for  dizziness, tremors, focal weakness, seizures and loss of consciousness.  Endo/Heme/Allergies: Positive for seasonal allergies.  Psychiatric/Behavioral: Negative for depression, suicidal ideas and hallucinations.  All other systems reviewed and are negative.   Physical Exam: Vitals:   01/13/17 1053  BP: 128/78  Pulse: 80   Body mass index is 29.45 kg/m. Gen:      No acute distress HEENT:  EOMI, sclera anicteric Neck:     No masses; no thyromegaly Lungs:    Clear to auscultation bilaterally; normal respiratory effort CV:         Regular rate and rhythm; no murmurs Abd:      + bowel sounds; soft, non-tender; no palpable masses, no distension Ext:    No edema; adequate peripheral perfusion Skin:      Warm and dry; no rash Neuro: alert and oriented  x 3 Psych: normal mood and affect Rectal exam: Decreased anal sphincter tone, no anal fissure, no pelvic descent on bear down  Data Reviewed:  Reviewed labs, radiology imaging, old records and pertinent past GI work up   Assessment and Plan/Recommendations:  71 year old female with history of irritable bowel syndrome predominant diarrhea here with complaints of worsening fecal incontinence  We'll schedule for anorectal manometry to evaluate anal sphincter Dyssynergia defecation We'll consider referral to pelvic physical therapy and biofeedback based on anorectal manometry findings Also do a trial of lactose-free diet for 2 weeks to see if its contributing to worsening IBS D symptoms Probiotic VSL#3 112 billion units Benefiber 1 tablespoon 3 times daily with meals Return in 1 month   25 minutes was spent face-to-face with the patient. Greater than 50% of the time used for counseling as well as treatment plan and follow-up of IBS-D and fecal incontinence. She had multiple questions which were answered to her satisfaction  K. Denzil Magnuson , MD [phone] Mon-Fri 8a-5p [phone] after 5p, weekends, holidays  CC: Crecencio Mc,  MD

## 2017-01-14 DIAGNOSIS — R159 Full incontinence of feces: Secondary | ICD-10-CM | POA: Insufficient documentation

## 2017-01-18 DIAGNOSIS — M1732 Unilateral post-traumatic osteoarthritis, left knee: Secondary | ICD-10-CM | POA: Diagnosis not present

## 2017-01-18 DIAGNOSIS — M25562 Pain in left knee: Secondary | ICD-10-CM | POA: Diagnosis not present

## 2017-01-20 NOTE — Telephone Encounter (Signed)
Scheduled 12/28/16

## 2017-01-21 ENCOUNTER — Telehealth: Payer: Self-pay | Admitting: Internal Medicine

## 2017-01-21 NOTE — Telephone Encounter (Signed)
Patient was informed of results.  Patient understood and no questions, comments, or concerns at this time.  

## 2017-01-21 NOTE — Telephone Encounter (Signed)
Her lower extremity ultrasound was negative for blood clots and incompetent veins.  If her pain and swelling persists,  I recommend seeing Orthopedics for further evaluation

## 2017-01-24 ENCOUNTER — Encounter (HOSPITAL_COMMUNITY)
Admission: RE | Disposition: A | Discharge status: Home / Self Care | Payer: Self-pay | Source: Ambulatory Visit | Attending: Gastroenterology

## 2017-01-24 ENCOUNTER — Ambulatory Visit (HOSPITAL_COMMUNITY)
Admission: RE | Admit: 2017-01-24 | Discharge: 2017-01-24 | Disposition: A | Discharge status: Home / Self Care | Payer: Medicare Other | Source: Ambulatory Visit | Attending: Gastroenterology | Admitting: Gastroenterology

## 2017-01-24 DIAGNOSIS — R159 Full incontinence of feces: Secondary | ICD-10-CM

## 2017-01-24 HISTORY — PX: ANAL RECTAL MANOMETRY: SHX6358

## 2017-01-24 SURGERY — MANOMETRY, ANORECTAL

## 2017-01-24 NOTE — Progress Notes (Signed)
Anal Rectal manometry done per protocol.  Pt. Tolerated well, without complication.  Dr Silverio Decamp to be notified today.  Laverta Baltimore, RN

## 2017-01-27 ENCOUNTER — Encounter (HOSPITAL_COMMUNITY): Payer: Self-pay | Admitting: Gastroenterology

## 2017-01-28 ENCOUNTER — Other Ambulatory Visit: Payer: Self-pay

## 2017-03-17 ENCOUNTER — Ambulatory Visit (INDEPENDENT_AMBULATORY_CARE_PROVIDER_SITE_OTHER): Payer: Medicare Other | Admitting: Internal Medicine

## 2017-03-17 ENCOUNTER — Encounter: Payer: Self-pay | Admitting: Internal Medicine

## 2017-03-17 VITALS — BP 152/92 | HR 64 | Temp 98.3°F | Resp 16 | Ht <= 58 in | Wt 147.8 lb

## 2017-03-17 DIAGNOSIS — R42 Dizziness and giddiness: Secondary | ICD-10-CM

## 2017-03-17 DIAGNOSIS — D649 Anemia, unspecified: Secondary | ICD-10-CM

## 2017-03-17 DIAGNOSIS — R300 Dysuria: Secondary | ICD-10-CM | POA: Diagnosis not present

## 2017-03-17 DIAGNOSIS — E1129 Type 2 diabetes mellitus with other diabetic kidney complication: Secondary | ICD-10-CM

## 2017-03-17 DIAGNOSIS — E538 Deficiency of other specified B group vitamins: Secondary | ICD-10-CM | POA: Diagnosis not present

## 2017-03-17 DIAGNOSIS — R7301 Impaired fasting glucose: Secondary | ICD-10-CM | POA: Diagnosis not present

## 2017-03-17 DIAGNOSIS — R159 Full incontinence of feces: Secondary | ICD-10-CM

## 2017-03-17 DIAGNOSIS — R809 Proteinuria, unspecified: Secondary | ICD-10-CM

## 2017-03-17 DIAGNOSIS — R110 Nausea: Secondary | ICD-10-CM

## 2017-03-17 LAB — POCT URINALYSIS DIPSTICK
Bilirubin, UA: NEGATIVE
Glucose, UA: NEGATIVE
Leukocytes, UA: NEGATIVE
Nitrite, UA: NEGATIVE
PROTEIN UA: NEGATIVE
Spec Grav, UA: 1.02 (ref 1.010–1.025)
Urobilinogen, UA: 1 E.U./dL
pH, UA: 5.5 (ref 5.0–8.0)

## 2017-03-17 LAB — COMPREHENSIVE METABOLIC PANEL
ALT: 9 U/L (ref 0–35)
AST: 15 U/L (ref 0–37)
Albumin: 4 g/dL (ref 3.5–5.2)
Alkaline Phosphatase: 57 U/L (ref 39–117)
BILIRUBIN TOTAL: 0.3 mg/dL (ref 0.2–1.2)
BUN: 14 mg/dL (ref 6–23)
CHLORIDE: 107 meq/L (ref 96–112)
CO2: 29 meq/L (ref 19–32)
CREATININE: 0.99 mg/dL (ref 0.40–1.20)
Calcium: 9 mg/dL (ref 8.4–10.5)
GFR: 71.41 mL/min (ref >60.00)
GLUCOSE: 100 mg/dL — AB (ref 70–99)
Potassium: 4.1 mEq/L (ref 3.5–5.1)
SODIUM: 142 meq/L (ref 135–145)
Total Protein: 6.7 g/dL (ref 6.0–8.3)

## 2017-03-17 LAB — URINALYSIS, MICROSCOPIC ONLY

## 2017-03-17 LAB — CBC WITH DIFFERENTIAL/PLATELET
BASOS ABS: 0 10*3/uL (ref 0.0–0.1)
BASOS PCT: 0.6 % (ref 0.0–3.0)
EOS ABS: 0.1 10*3/uL (ref 0.0–0.7)
Eosinophils Relative: 1.9 % (ref 0.0–5.0)
HEMATOCRIT: 30.9 % — AB (ref 36.0–46.0)
Hemoglobin: 10.3 g/dL — ABNORMAL LOW (ref 12.0–15.0)
LYMPHS ABS: 1.7 10*3/uL (ref 0.7–4.0)
LYMPHS PCT: 37.3 % (ref 12.0–46.0)
MCHC: 33.3 g/dL (ref 30.0–36.0)
MCV: 86.2 fl (ref 78.0–100.0)
MONO ABS: 0.4 10*3/uL (ref 0.1–1.0)
Monocytes Relative: 8.6 % (ref 3.0–12.0)
NEUTROS ABS: 2.3 10*3/uL (ref 1.4–7.7)
NEUTROS PCT: 51.6 % (ref 43.0–77.0)
Platelets: 299 10*3/uL (ref 150.0–400.0)
RBC: 3.58 Mil/uL — ABNORMAL LOW (ref 3.87–5.11)
RDW: 16.6 % — AB (ref 11.5–15.5)
WBC: 4.4 10*3/uL (ref 4.0–10.5)

## 2017-03-17 LAB — HEMOGLOBIN A1C: Hgb A1c MFr Bld: 6.6 % — ABNORMAL HIGH (ref 4.6–6.5)

## 2017-03-17 MED ORDER — PANTOPRAZOLE SODIUM 40 MG PO TBEC: 40.0000 mg | DELAYED_RELEASE_TABLET | Freq: Every day | ORAL | 3 refills | Status: DC

## 2017-03-17 MED ORDER — CYANOCOBALAMIN 1000 MCG/ML IJ SOLN
1000.0000 ug | Freq: Once | INTRAMUSCULAR | Status: AC
  Administered 2017-03-17: 1000 ug via INTRAMUSCULAR

## 2017-03-17 MED ORDER — ONDANSETRON HCL 4 MG PO TABS: 4.0000 mg | ORAL_TABLET | ORAL | 5 refills | Status: DC | PRN

## 2017-03-17 MED ORDER — LISINOPRIL 40 MG PO TABS: 40.0000 mg | ORAL_TABLET | Freq: Every day | ORAL | 5 refills | Status: DC

## 2017-03-17 MED ORDER — METOPROLOL TARTRATE 25 MG PO TABS: 25.0000 mg | ORAL_TABLET | Freq: Two times a day (BID) | ORAL | 5 refills | Status: DC

## 2017-03-17 NOTE — Patient Instructions (Addendum)
You might try Almased  As a dietary supplement to help you lose weight  Make sure you are eating 5 to 6 smaller meals daily.  This will keep your metabolism more active  Make sure your daily workout gets you short of breath .  For the blood pressure:  Try taking  lisinopril in the morning with thyroid medication   For the nause:  I WIlL REFILL THE ZOFRAN AND will prescribe protonix, but:   Do not start the protonix until you complete the H pylori test to evaluate the gastritis .

## 2017-03-17 NOTE — Progress Notes (Signed)
Subjective:  Patient ID: Christine Manning, female    DOB: 1946/09/07  Age: 71 y.o. MRN: [id number]  CC: The primary encounter diagnosis was Nausea in adult. Diagnoses of Dizziness, Impaired fasting glucose, Dysuria, B12 deficiency, Incontinence of feces, unspecified fecal incontinence type, Normocytic anemia, and Type 2 diabetes mellitus with microalbuminuria, without long-term current use of insulin ([place]) were also pertinent to this visit.  HPI Christine Manning Christine Manning presents for follow up on hypertension hypothyroid, etc.   Cc: 1) getting dizzy early in the morning  upon waking  , lasts just a few minutes or sudden position change  2) Having recurrent nausea  And off an on through out the day and at  Night accompanied by abdominal pain .  Chronic issue.  Out of zofran. Has not had an EGD in over 10 years. . H Pylori gastritis .   At last visit referred to GI for management of IBS ,  Also referred fo rUS due to chronic RUQ pain   [place] negative for gallstones .  Saw [facility] in 2016 for same    Anal manometry done May 7 noted weak internal and external anal sphincters.  referrred to [facility] for PT but cant afford it until after September.  stoosl are solid,  But having trouble emptying  Bowels so she has some staining .  wering a napkin .   Lab Results  Component Value Date   HGBA1C 6.6 (H) 03/17/2017      NOT ORTHOSTATIC   WANTS TO LOSE WEIGHT. "i EAT HEALTHY" Walks daily for up to an hour. gets out of breath with walking.  Does water aerobics 2/eek  Lab Results  Component Value Date   TSH 0.50 11/30/2016     Outpatient Medications Prior to Visit  Medication Sig Dispense Refill  . albuterol (PROVENTIL HFA;VENTOLIN HFA) 108 (90 Base) MCG/ACT inhaler Inhale 2 puffs into the lungs every 6 (six) hours as needed for wheezing or shortness of breath. 1 Inhaler 3  . albuterol (PROVENTIL) (2.5 MG/3ML) 0.083% nebulizer solution Take 3 mLs (2.5 mg total) by nebulization every 6 (six)  hours as needed for wheezing or shortness of breath. 150 mL 1  . aspirin 81 MG tablet Take 81 mg by mouth daily.    . Cyanocobalamin (VITAMIN B-12 IJ) Inject 1 each as directed every 28 (twenty-eight) days.    . Lactobacillus-Inulin ([place]) CAPS Take 1 capsule by mouth daily. 14 capsule 0  . levothyroxine (SYNTHROID, LEVOTHROID) 125 MCG tablet Take 1 tablet (125 mcg total) by mouth daily before breakfast. 30 tablet 1  . loratadine (CLARITIN) 10 MG tablet Take 1 tablet (10 mg total) by mouth daily. 30 tablet 0  . mometasone (NASONEX) 50 MCG/ACT nasal spray Place 2 sprays into the nose daily.    . Multiple Vitamin (MULTIVITAMIN) tablet Take 1 tablet by mouth daily.    . simvastatin (ZOCOR) 40 MG tablet Take 1 tablet (40 mg total) by mouth every evening. 90 tablet 3  . lisinopril (PRINIVIL,ZESTRIL) 40 MG tablet Take 1 tablet (40 mg total) by mouth daily. 30 tablet 0  . metoprolol tartrate (LOPRESSOR) 25 MG tablet Take 1 tablet (25 mg total) by mouth 2 (two) times daily. 30 tablet 0  . ondansetron (ZOFRAN) 4 MG tablet Take 1 tablet (4 mg total) by mouth every 8 (eight) hours as needed for nausea or vomiting. (Patient taking differently: Take 4 mg by mouth as needed for nausea or vomiting. ) 20 tablet 0  .  Vitamin D, Ergocalciferol, (DRISDOL) 50000 units CAPS capsule Take 1 capsule (50,000 Units total) by mouth every 7 (seven) days. (Patient not taking: Reported on 03/17/2017) 4 capsule 3   No facility-administered medications prior to visit.     Review of Systems;  Patient denies headache, fevers, malaise, unintentional weight loss, skin rash, eye pain, sinus congestion and sinus pain, sore throat, dysphagia,  hemoptysis , cough, dyspnea, wheezing, chest pain, palpitations, orthopnea, edema, abdominal pain, nausea, melena, diarrhea, constipation, flank pain, dysuria, hematuria, urinary  Frequency, nocturia, numbness, tingling, seizures,  Focal weakness, Loss of consciousness,   Tremor, insomnia, depression, anxiety, and suicidal ideation.      Objective:  BP (!) 152/92 (BP Location: Left Arm, Patient Position: Sitting, Cuff Size: Normal)   Pulse 64   Temp 98.3 F (36.8 C) (Oral)   Resp 16   Ht 4\' 10"  (1.473 m)   Wt 147 lb 12.8 oz (67 kg)   SpO2 96%   BMI 30.89 kg/m   BP Readings from Last 3 Encounters:  03/17/17 (!) 152/92  01/13/17 128/78  01/03/17 138/80    Wt Readings from Last 3 Encounters:  03/17/17 147 lb 12.8 oz (67 kg)  01/13/17 144 lb 2 oz (65.4 kg)  01/03/17 144 lb 8 oz (65.5 kg)    General appearance: alert, cooperative and appears stated age Ears: normal TM's and external ear canals both ears Throat: lips, mucosa, and tongue normal; teeth and gums normal Neck: no adenopathy, no carotid bruit, supple, symmetrical, trachea midline and thyroid not enlarged, symmetric, no tenderness/mass/nodules Back: symmetric, no curvature. ROM normal. No CVA tenderness. Lungs: clear to auscultation bilaterally Heart: regular rate and rhythm, S1, S2 normal, no murmur, click, rub or gallop Abdomen: soft, non-tender; bowel sounds normal; no masses,  no organomegaly Pulses: 2+ and symmetric Skin: Skin color, texture, turgor normal. No rashes or lesions Lymph nodes: Cervical, supraclavicular, and axillary nodes normal.  Lab Results  Component Value Date   HGBA1C 6.6 (H) 03/17/2017    Lab Results  Component Value Date   CREATININE 0.99 03/17/2017   CREATININE 1.16 09/15/2016   CREATININE 1.06 02/02/2016    Lab Results  Component Value Date   WBC 4.4 03/17/2017   HGB 10.3 (L) 03/17/2017   HCT 30.9 (L) 03/17/2017   PLT 299.0 03/17/2017   GLUCOSE 100 (H) 03/17/2017   CHOL 198 11/30/2016   TRIG 124.0 11/30/2016   HDL 50.30 11/30/2016   LDLDIRECT 165.0 09/10/2015   LDLCALC 122 (H) 11/30/2016   ALT 9 03/17/2017   AST 15 03/17/2017   NA 142 03/17/2017   K 4.1 03/17/2017   CL 107 03/17/2017   CREATININE 0.99 03/17/2017   BUN 14 03/17/2017     CO2 29 03/17/2017   TSH 0.50 11/30/2016   HGBA1C 6.6 (H) 03/17/2017   MICROALBUR 2.5 (H) 07/05/2013    No results found.  Assessment & Plan:   Problem List Items Addressed This Visit    Type 2 diabetes mellitus with microalbuminuria ([facility])    A1c is now suggestive of diabetes.  I have addressed  BMI and A1c and recommended a low glycemic index diet utilizing smaller more frequent meals to increase metabolism.  I have also recommended that patient change her exercise to a goal of 30 minutes of aerobic exercise a minimum of 5 days per week.   She is already taking lisinopri.  Will not recommend asa until the source of her nausea and other  GI issues have been clarified.  Lab Results  Component Value Date   HGBA1C 6.6 (H) 03/17/2017  . Lab Results  Component Value Date   MICROALBUR 2.5 (H) 07/05/2013          Relevant Medications   lisinopril (PRINIVIL,ZESTRIL) 40 MG tablet   Normocytic anemia    Persistent despite normalization of thyroid function .  She will return for SPEP  To rule out MM,   iron studies, folate and supplemental B12   Lab Results  Component Value Date   WBC 4.4 03/17/2017   HGB 10.3 (L) 03/17/2017   HCT 30.9 (L) 03/17/2017   MCV 86.2 03/17/2017   PLT 299.0 03/17/2017   Lab Results  Component Value Date   TSH 0.50 11/30/2016   Lab Results  Component Value Date   VITAMINB12 346 09/15/2016   No results found for: FOLATE       Relevant Medications   cyanocobalamin ((VITAMIN B-12)) injection 1,000 mcg (Completed)   Nausea in adult - Primary    History of H pylori gastritis.  H pylori stool antigen ordered,  UA normal  LFTs normal,  Treat empirically with protonix once H pylori stool test has been collected       Relevant Medications   pantoprazole (PROTONIX) 40 MG tablet   Other Relevant Orders   Helicobacter pylori special antigen   Comprehensive metabolic panel (Completed)   Incontinence of feces    Secondary to decreased internal and  external sphincter pressures,  Referred by GI for pelvic floor PT but she has deferred for now       Dizziness    Etiology unclear,  Not orthostatic  by today's exam .  Present only in early morning upon awaking. Occurs with position change. No vertigo.    Checking B12 level       Relevant Orders   CBC with Differential/Platelet (Completed)   B12 deficiency   Relevant Medications   cyanocobalamin ((VITAMIN B-12)) injection 1,000 mcg (Completed)    Other Visit Diagnoses    Dysuria       Relevant Orders   POCT Urinalysis Dipstick (Completed)   Urine Culture (Completed)   Urine Microscopic Only (Completed)      I have changed Ms. Gadison's ondansetron. I am also having her start on pantoprazole. Additionally, I am having her maintain her mometasone, aspirin, [facility], multivitamin, loratadine, Cyanocobalamin (VITAMIN B-12 IJ), albuterol, albuterol, Vitamin D (Ergocalciferol), simvastatin, levothyroxine, lisinopril, and metoprolol tartrate. We administered cyanocobalamin.  Meds ordered this encounter  Medications  . pantoprazole (PROTONIX) 40 MG tablet    Sig: Take 1 tablet (40 mg total) by mouth daily.    Dispense:  30 tablet    Refill:  3  . ondansetron (ZOFRAN) 4 MG tablet    Sig: Take 1 tablet (4 mg total) by mouth as needed for nausea or vomiting.    Dispense:  30 tablet    Refill:  5  . lisinopril (PRINIVIL,ZESTRIL) 40 MG tablet    Sig: Take 1 tablet (40 mg total) by mouth daily.    Dispense:  30 tablet    Refill:  5    NOTE DOSE CHANGE  . metoprolol tartrate (LOPRESSOR) 25 MG tablet    Sig: Take 1 tablet (25 mg total) by mouth 2 (two) times daily.    Dispense:  30 tablet    Refill:  5  . cyanocobalamin ((VITAMIN B-12)) injection 1,000 mcg    Medications Discontinued During This Encounter  Medication Reason  . ondansetron (ZOFRAN) 4  MG tablet Reorder  . lisinopril (PRINIVIL,ZESTRIL) 40 MG tablet Reorder  . metoprolol tartrate (LOPRESSOR) 25 MG  tablet Reorder    Follow-up: No Follow-up on file.   Crecencio Mc, MD

## 2017-03-18 ENCOUNTER — Other Ambulatory Visit: Payer: Self-pay | Admitting: Internal Medicine

## 2017-03-18 ENCOUNTER — Other Ambulatory Visit (INDEPENDENT_AMBULATORY_CARE_PROVIDER_SITE_OTHER): Payer: Medicare Other

## 2017-03-18 DIAGNOSIS — E039 Hypothyroidism, unspecified: Secondary | ICD-10-CM

## 2017-03-18 DIAGNOSIS — R110 Nausea: Secondary | ICD-10-CM | POA: Diagnosis not present

## 2017-03-18 DIAGNOSIS — D649 Anemia, unspecified: Secondary | ICD-10-CM

## 2017-03-18 LAB — URINE CULTURE: ORGANISM ID, BACTERIA: NO GROWTH

## 2017-03-18 NOTE — Progress Notes (Signed)
Lab Results  Component Value Date   TSH 0.50 11/30/2016

## 2017-03-20 DIAGNOSIS — E1129 Type 2 diabetes mellitus with other diabetic kidney complication: Secondary | ICD-10-CM | POA: Insufficient documentation

## 2017-03-20 DIAGNOSIS — R110 Nausea: Secondary | ICD-10-CM | POA: Insufficient documentation

## 2017-03-20 DIAGNOSIS — R809 Proteinuria, unspecified: Secondary | ICD-10-CM

## 2017-03-20 NOTE — Assessment & Plan Note (Signed)
Persistent despite normalization of thyroid function .  She will return for SPEP  To rule out MM,   iron studies, folate and supplemental B12   Lab Results  Component Value Date   WBC 4.4 03/17/2017   HGB 10.3 (L) 03/17/2017   HCT 30.9 (L) 03/17/2017   MCV 86.2 03/17/2017   PLT 299.0 03/17/2017   Lab Results  Component Value Date   TSH 0.50 11/30/2016   Lab Results  Component Value Date   VITAMINB12 346 09/15/2016   No results found for: FOLATE

## 2017-03-20 NOTE — Assessment & Plan Note (Signed)
Secondary to decreased internal and external sphincter pressures,  Referred by GI for pelvic floor PT but she has deferred for now

## 2017-03-20 NOTE — Assessment & Plan Note (Signed)
History of H pylori gastritis.  H pylori stool antigen ordered,  UA normal  LFTs normal,  Treat empirically with protonix once H pylori stool test has been collected

## 2017-03-20 NOTE — Assessment & Plan Note (Signed)
Etiology unclear,  Not orthostatic  by today's exam .  Present only in early morning upon awaking. Occurs with position change. No vertigo.    Checking B12 level

## 2017-03-20 NOTE — Assessment & Plan Note (Addendum)
A1c is now suggestive of diabetes.  I have addressed  BMI and A1c and recommended a low glycemic index diet utilizing smaller more frequent meals to increase metabolism.  I have also recommended that patient change her exercise to a goal of 30 minutes of aerobic exercise a minimum of 5 days per week.   She is already taking lisinopri.  Will not recommend asa until the source of her nausea and other  GI issues have been clarified.   Lab Results  Component Value Date   HGBA1C 6.6 (H) 03/17/2017  . Lab Results  Component Value Date   MICROALBUR 2.5 (H) 07/05/2013

## 2017-03-21 ENCOUNTER — Telehealth: Payer: Self-pay | Admitting: Radiology

## 2017-03-21 ENCOUNTER — Other Ambulatory Visit (INDEPENDENT_AMBULATORY_CARE_PROVIDER_SITE_OTHER): Payer: Medicare Other

## 2017-03-21 ENCOUNTER — Other Ambulatory Visit: Payer: Medicare Other

## 2017-03-21 DIAGNOSIS — E039 Hypothyroidism, unspecified: Secondary | ICD-10-CM

## 2017-03-21 DIAGNOSIS — D649 Anemia, unspecified: Secondary | ICD-10-CM | POA: Diagnosis not present

## 2017-03-21 DIAGNOSIS — Z1211 Encounter for screening for malignant neoplasm of colon: Secondary | ICD-10-CM

## 2017-03-21 LAB — RETICULOCYTES
ABS Retic: 40480 cells/uL (ref 20000–80000)
RBC.: 3.68 MIL/uL — AB (ref 3.80–5.10)
RETIC CT PCT: 1.1 %

## 2017-03-21 LAB — IRON AND TIBC
%SAT: 14 % (ref 11–50)
Iron: 54 ug/dL (ref 45–160)
TIBC: 384 ug/dL (ref 250–450)
UIBC: 330 ug/dL

## 2017-03-21 LAB — VITAMIN B12: VITAMIN B 12: 735 pg/mL (ref 211–911)

## 2017-03-21 LAB — FERRITIN: FERRITIN: 8.6 ng/mL — AB (ref 10.0–291.0)

## 2017-03-21 LAB — HELICOBACTER PYLORI  SPECIAL ANTIGEN: H. PYLORI Antigen: DETECTED

## 2017-03-21 NOTE — Addendum Note (Signed)
Addended by: Leeanne Rio on: 03/21/2017 03:43 PM   Modules accepted: Orders

## 2017-03-21 NOTE — Telephone Encounter (Signed)
Pt came in for labs today. Pt states her best arm was her right arm and asked to have labs drawn in her right AC. Stuck pt and pt jerked back her right arm and stated "ouch that hurt". Advised pt to try and hold right arm still to avoid injury. 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 Apologized to pt and explained that sometimes over usage of a vein could as a result develop scar tissue and sometimes it does hurt a little. Successful, 1 stick.

## 2017-03-22 ENCOUNTER — Telehealth: Payer: Self-pay | Admitting: Internal Medicine

## 2017-03-22 LAB — FOLATE RBC: RBC Folate: 803 ng/mL (ref >280)

## 2017-03-22 LAB — T4 AND TSH
T4 TOTAL: 8.7 ug/dL (ref 4.5–12.0)
TSH: 0.337 u[IU]/mL — ABNORMAL LOW (ref 0.450–4.500)

## 2017-03-22 NOTE — Telephone Encounter (Signed)
Correction to previous  lab message:  H Pylori test is positive ,but she has not started the PPI (pantoprazole)  yet,  So she has a choice:  1) take the PPI and the dual antibiotic therapy for H Pylor of 2)  if she would like to try taking the PPI daily for 2 weeks then let me know if her nausea has resolved, we can hold off an antibiotics

## 2017-03-22 NOTE — Telephone Encounter (Signed)
Please disregard the result note message. Pt wasn't clear of what medication I was talking about. I gave her a different name for the mediation and she stated that she had not started the medication yet. The pt however did still state that the nausea has gotten better so she will just try taking the protonix for 2 weeks and then let you know.

## 2017-03-24 LAB — ERYTHROPOIETIN: ERYTHROPOIETIN: 26.4 m[IU]/mL — AB (ref 2.6–18.5)

## 2017-03-25 LAB — PROTEIN ELECTROPHORESIS, SERUM
ALPHA-1-GLOBULIN: 0.3 g/dL (ref 0.2–0.3)
Albumin ELP: 4 g/dL (ref 3.8–4.8)
Alpha-2-Globulin: 0.8 g/dL (ref 0.5–0.9)
BETA 2: 0.3 g/dL (ref 0.2–0.5)
Beta Globulin: 0.5 g/dL (ref 0.4–0.6)
GAMMA GLOBULIN: 1.1 g/dL (ref 0.8–1.7)
TOTAL PROTEIN, SERUM ELECTROPHOR: 6.9 g/dL (ref 6.1–8.1)

## 2017-04-19 ENCOUNTER — Ambulatory Visit (INDEPENDENT_AMBULATORY_CARE_PROVIDER_SITE_OTHER): Payer: Medicare Other

## 2017-04-19 DIAGNOSIS — E538 Deficiency of other specified B group vitamins: Secondary | ICD-10-CM

## 2017-04-19 MED ORDER — CYANOCOBALAMIN 1000 MCG/ML IJ SOLN
1000.0000 ug | Freq: Once | INTRAMUSCULAR | Status: AC
  Administered 2017-04-19: 1000 ug via INTRAMUSCULAR

## 2017-04-19 NOTE — Progress Notes (Signed)
Patient came in for B12 injection.  Received in Right deltoid.  Patient tolerated well.  

## 2017-05-07 DIAGNOSIS — H5712 Ocular pain, left eye: Secondary | ICD-10-CM | POA: Diagnosis not present

## 2017-05-07 DIAGNOSIS — R51 Headache: Secondary | ICD-10-CM | POA: Diagnosis not present

## 2017-05-07 DIAGNOSIS — B028 Zoster with other complications: Secondary | ICD-10-CM | POA: Diagnosis not present

## 2017-05-07 DIAGNOSIS — B029 Zoster without complications: Secondary | ICD-10-CM | POA: Diagnosis not present

## 2017-05-07 DIAGNOSIS — I10 Essential (primary) hypertension: Secondary | ICD-10-CM | POA: Diagnosis not present

## 2017-05-09 DIAGNOSIS — H2513 Age-related nuclear cataract, bilateral: Secondary | ICD-10-CM | POA: Diagnosis not present

## 2017-05-09 DIAGNOSIS — B029 Zoster without complications: Secondary | ICD-10-CM | POA: Diagnosis not present

## 2017-05-10 ENCOUNTER — Ambulatory Visit (INDEPENDENT_AMBULATORY_CARE_PROVIDER_SITE_OTHER): Payer: Medicare Other | Admitting: Family Medicine

## 2017-05-10 ENCOUNTER — Encounter: Payer: Self-pay | Admitting: Family Medicine

## 2017-05-10 DIAGNOSIS — B029 Zoster without complications: Secondary | ICD-10-CM

## 2017-05-10 MED ORDER — GABAPENTIN 300 MG PO CAPS: 300.0000 mg | ORAL_CAPSULE | Freq: Three times a day (TID) | ORAL | 0 refills | Status: DC | PRN

## 2017-05-10 NOTE — Assessment & Plan Note (Signed)
New problem. No eye involvement (other than the eyelid). Patient is diabetic and current literature does support the use of glucocorticoids without complications.  I recommended continuing the Valtrex. I'm going to treat her pain with gabapentin.

## 2017-05-10 NOTE — Progress Notes (Signed)
Subjective:  Patient ID: Christine Manning, female    DOB: 1946-05-15  Age: 71 y.o. MRN: [id number]  CC: Shingles  HPI:  71 year old female with an extensive past medical history presents with complaints of shingles.  Patient reports that she developed a rash on her face (forehead, left eyelid, hair line) on Friday. She was seen in the ER on Saturday. She was diagnosed with shingles and placed on Valtrex. She saw ophthalmology yesterday. She brought a note with her today. Note indicates that she did not have any ocular involvement besides the eyelids. Ophthalmologist recommended that she see her PCP for consideration for steroids. She presents today with complaints of pain. Shingles appears to be improving as far as the rash goes. She states that she has severe pain and headache particularly on the left side of her face. No no relieving factors. No other associated symptoms. No other complaints or concerns at this time.  Social Hx   Social History   Social History  . Marital status: Divorced    Spouse name: N/A  . Number of children: 3  . Years of education: N/A   Occupational History  . Retired    Social History Main Topics  . Smoking status: Never Smoker  . Smokeless tobacco: Never Used  . Alcohol use No  . Drug use: No  . Sexual activity: Not Currently   Other Topics Concern  . 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     Review of Systems  Eyes: Positive for visual disturbance.  Skin: Positive for rash.   Objective:  BP (!) 158/84 (BP Location: Left Arm, Patient Position: Sitting, Cuff Size: Normal)   Pulse (!) 58   Temp 98.3 F (36.8 C) (Oral)   Wt 143 lb 3 oz (64.9 kg)   BMI 29.93 kg/m   BP/Weight 05/10/2017 03/17/2017 5/36/6440  Systolic BP 347 425 956  Diastolic BP 84 92 78  Wt. (Lbs) 143.19 147.8 144.13  BMI 29.93 30.89 29.45   Physical Exam  Constitutional: She is oriented to person, place, and time. She  appears well-developed. No distress.  Cardiovascular: Normal rate and regular rhythm.   Murmur heard. Pulmonary/Chest: Effort normal. She has no wheezes. She has no rales.  Neurological: She is alert and oriented to person, place, and time.  Skin:  Vesicular rash noted on the forehead and the left eyelid.  Psychiatric: She has a normal mood and affect.  Vitals reviewed.   Lab Results  Component Value Date   WBC 4.4 03/17/2017   HGB 10.3 (L) 03/17/2017   HCT 30.9 (L) 03/17/2017   PLT 299.0 03/17/2017   GLUCOSE 100 (H) 03/17/2017   CHOL 198 11/30/2016   TRIG 124.0 11/30/2016   HDL 50.30 11/30/2016   LDLDIRECT 165.0 09/10/2015   LDLCALC 122 (H) 11/30/2016   ALT 9 03/17/2017   AST 15 03/17/2017   NA 142 03/17/2017   K 4.1 03/17/2017   CL 107 03/17/2017   CREATININE 0.99 03/17/2017   BUN 14 03/17/2017   CO2 29 03/17/2017   TSH 0.337 (L) 03/21/2017   HGBA1C 6.6 (H) 03/17/2017   MICROALBUR 2.5 (H) 07/05/2013    Assessment & Plan:   Problem List Items Addressed This Visit    Herpes zoster    New problem. No eye involvement (other than the eyelid). Patient is diabetic and current literature does support the use of glucocorticoids without complications.  I recommended continuing the Valtrex.  I'm going to treat her pain with gabapentin.       Relevant Medications   valACYclovir (VALTREX) 1000 MG tablet      Meds ordered this encounter  Medications  . valACYclovir (VALTREX) 1000 MG tablet    Sig: Take 1,000 mg by mouth 3 (three) times daily.   Marland Kitchen gabapentin (NEURONTIN) 300 MG capsule    Sig: Take 1 capsule (300 mg total) by mouth 3 (three) times daily as needed.    Dispense:  90 capsule    Refill:  0     Follow-up: PRN  [place]

## 2017-05-10 NOTE — Patient Instructions (Signed)
Continue the Valtrex.  Try the gabapentin for your pain.  You can also use tylenol.  Take care and be sure to see the eye doctor again if you feel that it is affecting the eye.  Dr. Lacinda Axon

## 2017-05-16 ENCOUNTER — Other Ambulatory Visit: Payer: Self-pay

## 2017-05-16 DIAGNOSIS — H2513 Age-related nuclear cataract, bilateral: Secondary | ICD-10-CM | POA: Diagnosis not present

## 2017-05-16 DIAGNOSIS — B029 Zoster without complications: Secondary | ICD-10-CM | POA: Diagnosis not present

## 2017-05-16 MED ORDER — LISINOPRIL 40 MG PO TABS: 40.0000 mg | ORAL_TABLET | Freq: Every day | ORAL | 1 refills | Status: DC

## 2017-05-19 ENCOUNTER — Encounter (INDEPENDENT_AMBULATORY_CARE_PROVIDER_SITE_OTHER): Payer: Self-pay

## 2017-05-19 ENCOUNTER — Ambulatory Visit (INDEPENDENT_AMBULATORY_CARE_PROVIDER_SITE_OTHER): Payer: Medicare Other

## 2017-05-19 DIAGNOSIS — E538 Deficiency of other specified B group vitamins: Secondary | ICD-10-CM | POA: Diagnosis not present

## 2017-05-19 MED ORDER — CYANOCOBALAMIN 1000 MCG/ML IJ SOLN
1000.0000 ug | Freq: Once | INTRAMUSCULAR | Status: AC
Start: 2017-05-19 — End: 2017-05-19
  Administered 2017-05-19: 1000 ug via INTRAMUSCULAR

## 2017-05-19 NOTE — Progress Notes (Signed)
Patient comes in for B 12 injection.  Injected left deltoid.  Patient tolerated injection well.   Reviewed.  Dr Scott 

## 2017-05-20 LAB — HM DIABETES EYE EXAM

## 2017-06-13 DIAGNOSIS — B029 Zoster without complications: Secondary | ICD-10-CM | POA: Diagnosis not present

## 2017-06-13 DIAGNOSIS — H2513 Age-related nuclear cataract, bilateral: Secondary | ICD-10-CM | POA: Diagnosis not present

## 2017-06-15 NOTE — Telephone Encounter (Signed)
Opened in error

## 2017-06-21 ENCOUNTER — Other Ambulatory Visit: Payer: Self-pay | Admitting: Internal Medicine

## 2017-06-21 ENCOUNTER — Ambulatory Visit (INDEPENDENT_AMBULATORY_CARE_PROVIDER_SITE_OTHER): Payer: Medicare Other

## 2017-06-21 DIAGNOSIS — E538 Deficiency of other specified B group vitamins: Secondary | ICD-10-CM | POA: Diagnosis not present

## 2017-06-21 DIAGNOSIS — Z23 Encounter for immunization: Secondary | ICD-10-CM | POA: Diagnosis not present

## 2017-06-21 MED ORDER — CYANOCOBALAMIN 1000 MCG/ML IJ SOLN
1000.0000 ug | Freq: Once | INTRAMUSCULAR | Status: AC
  Administered 2017-06-21: 1000 ug via INTRAMUSCULAR

## 2017-06-21 NOTE — Progress Notes (Signed)
Patient came in for B 12 injection and flu injection.  B 12 injection given in left arm.  Flu injection given in right deltoid.   Patient tolerated injections well.      I have reviewed the above information and agree with above.   Deborra Medina, MD

## 2017-08-17 ENCOUNTER — Ambulatory Visit (INDEPENDENT_AMBULATORY_CARE_PROVIDER_SITE_OTHER): Payer: Medicare Other | Admitting: *Deleted

## 2017-08-17 DIAGNOSIS — E538 Deficiency of other specified B group vitamins: Secondary | ICD-10-CM

## 2017-08-17 MED ORDER — CYANOCOBALAMIN 1000 MCG/ML IJ SOLN
1000.0000 ug | Freq: Once | INTRAMUSCULAR | Status: AC
  Administered 2017-08-17: 1000 ug via INTRAMUSCULAR

## 2017-08-17 NOTE — Progress Notes (Signed)
Patient presented for B 12 injection to left deltoid, patient voiced no concerns nor showed any signs of distress during injection. 

## 2017-09-19 ENCOUNTER — Encounter: Payer: Self-pay | Admitting: Internal Medicine

## 2017-09-19 ENCOUNTER — Other Ambulatory Visit: Payer: Self-pay | Admitting: Internal Medicine

## 2017-09-19 ENCOUNTER — Ambulatory Visit (INDEPENDENT_AMBULATORY_CARE_PROVIDER_SITE_OTHER): Payer: Medicare Other | Admitting: Internal Medicine

## 2017-09-19 VITALS — BP 136/88 | HR 55 | Temp 98.1°F | Resp 16 | Ht <= 58 in | Wt 148.4 lb

## 2017-09-19 DIAGNOSIS — R809 Proteinuria, unspecified: Secondary | ICD-10-CM | POA: Diagnosis not present

## 2017-09-19 DIAGNOSIS — R058 Other specified cough: Secondary | ICD-10-CM | POA: Insufficient documentation

## 2017-09-19 DIAGNOSIS — E538 Deficiency of other specified B group vitamins: Secondary | ICD-10-CM | POA: Diagnosis not present

## 2017-09-19 DIAGNOSIS — I10 Essential (primary) hypertension: Secondary | ICD-10-CM | POA: Diagnosis not present

## 2017-09-19 DIAGNOSIS — D649 Anemia, unspecified: Secondary | ICD-10-CM

## 2017-09-19 DIAGNOSIS — E039 Hypothyroidism, unspecified: Secondary | ICD-10-CM | POA: Diagnosis not present

## 2017-09-19 DIAGNOSIS — Z0000 Encounter for general adult medical examination without abnormal findings: Secondary | ICD-10-CM

## 2017-09-19 DIAGNOSIS — R110 Nausea: Secondary | ICD-10-CM

## 2017-09-19 DIAGNOSIS — M5030 Other cervical disc degeneration, unspecified cervical region: Secondary | ICD-10-CM | POA: Diagnosis not present

## 2017-09-19 DIAGNOSIS — Z0001 Encounter for general adult medical examination with abnormal findings: Secondary | ICD-10-CM

## 2017-09-19 DIAGNOSIS — E559 Vitamin D deficiency, unspecified: Secondary | ICD-10-CM

## 2017-09-19 DIAGNOSIS — E1129 Type 2 diabetes mellitus with other diabetic kidney complication: Secondary | ICD-10-CM

## 2017-09-19 DIAGNOSIS — R05 Cough: Secondary | ICD-10-CM

## 2017-09-19 DIAGNOSIS — Z1239 Encounter for other screening for malignant neoplasm of breast: Secondary | ICD-10-CM

## 2017-09-19 DIAGNOSIS — Z1231 Encounter for screening mammogram for malignant neoplasm of breast: Secondary | ICD-10-CM

## 2017-09-19 LAB — CBC WITH DIFFERENTIAL/PLATELET
BASOS PCT: 1 % (ref 0.0–3.0)
Basophils Absolute: 0 10*3/uL (ref 0.0–0.1)
EOS PCT: 1.7 % (ref 0.0–5.0)
Eosinophils Absolute: 0.1 10*3/uL (ref 0.0–0.7)
HCT: 30.6 % — ABNORMAL LOW (ref 36.0–46.0)
Hemoglobin: 10.1 g/dL — ABNORMAL LOW (ref 12.0–15.0)
LYMPHS ABS: 1.9 10*3/uL (ref 0.7–4.0)
Lymphocytes Relative: 38.7 % (ref 12.0–46.0)
MCHC: 33.2 g/dL (ref 30.0–36.0)
MCV: 88.5 fl (ref 78.0–100.0)
MONO ABS: 0.3 10*3/uL (ref 0.1–1.0)
Monocytes Relative: 5.8 % (ref 3.0–12.0)
NEUTROS PCT: 52.8 % (ref 43.0–77.0)
Neutro Abs: 2.5 10*3/uL (ref 1.4–7.7)
PLATELETS: 353 10*3/uL (ref 150.0–400.0)
RBC: 3.45 Mil/uL — ABNORMAL LOW (ref 3.87–5.11)
RDW: 15.4 % (ref 11.5–15.5)
WBC: 4.8 10*3/uL (ref 4.0–10.5)

## 2017-09-19 LAB — COMPREHENSIVE METABOLIC PANEL
ALBUMIN: 4.2 g/dL (ref 3.5–5.2)
ALT: 13 U/L (ref 0–35)
AST: 20 U/L (ref 0–37)
Alkaline Phosphatase: 55 U/L (ref 39–117)
BUN: 9 mg/dL (ref 6–23)
CHLORIDE: 105 meq/L (ref 96–112)
CO2: 28 meq/L (ref 19–32)
CREATININE: 0.98 mg/dL (ref 0.40–1.20)
Calcium: 8.9 mg/dL (ref 8.4–10.5)
GFR: 72.15 mL/min (ref >60.00)
Glucose, Bld: 93 mg/dL (ref 70–99)
Potassium: 4.1 mEq/L (ref 3.5–5.1)
SODIUM: 141 meq/L (ref 135–145)
Total Bilirubin: 0.3 mg/dL (ref 0.2–1.2)
Total Protein: 7.3 g/dL (ref 6.0–8.3)

## 2017-09-19 LAB — SEDIMENTATION RATE: Sed Rate: 36 mm/hr — ABNORMAL HIGH (ref 0–30)

## 2017-09-19 LAB — LIPID PANEL
CHOL/HDL RATIO: 5
CHOLESTEROL: 216 mg/dL — AB (ref 0–200)
HDL: 47.5 mg/dL (ref >39.00)
LDL CALC: 130 mg/dL — AB (ref 0–99)
NonHDL: 168.09
Triglycerides: 188 mg/dL — ABNORMAL HIGH (ref 0.0–149.0)
VLDL: 37.6 mg/dL (ref 0.0–40.0)

## 2017-09-19 LAB — HEMOGLOBIN A1C: Hgb A1c MFr Bld: 6.5 % (ref 4.6–6.5)

## 2017-09-19 LAB — TSH: TSH: 7.83 u[IU]/mL — AB (ref 0.35–4.50)

## 2017-09-19 LAB — MICROALBUMIN / CREATININE URINE RATIO
CREATININE, U: 187.6 mg/dL
MICROALB UR: 2.8 mg/dL — AB (ref 0.0–1.9)
MICROALB/CREAT RATIO: 1.5 mg/g (ref 0.0–30.0)

## 2017-09-19 LAB — VITAMIN D 25 HYDROXY (VIT D DEFICIENCY, FRACTURES): VITD: 15.33 ng/mL — ABNORMAL LOW (ref 30.00–100.00)

## 2017-09-19 LAB — VITAMIN B12: Vitamin B-12: 625 pg/mL (ref 211–911)

## 2017-09-19 LAB — HM DIABETES FOOT EXAM: HM DIABETIC FOOT EXAM: NORMAL

## 2017-09-19 MED ORDER — PANTOPRAZOLE SODIUM 40 MG PO TBEC: 40.0000 mg | DELAYED_RELEASE_TABLET | Freq: Every day | ORAL | 3 refills | Status: DC

## 2017-09-19 MED ORDER — ERGOCALCIFEROL 1.25 MG (50000 UT) PO CAPS: 50000.0000 [IU] | ORAL_CAPSULE | ORAL | 3 refills | Status: DC

## 2017-09-19 MED ORDER — MELOXICAM 15 MG PO TABS: 15.0000 mg | ORAL_TABLET | Freq: Every day | ORAL | 2 refills | Status: DC

## 2017-09-19 MED ORDER — LEVOTHYROXINE SODIUM 150 MCG PO TABS: 150.0000 ug | ORAL_TABLET | Freq: Every day | ORAL | 0 refills | Status: DC

## 2017-09-19 NOTE — Assessment & Plan Note (Signed)
Repeating Drisdol dose weekly for 4 months given recurrence

## 2017-09-19 NOTE — Assessment & Plan Note (Signed)
Empiric treatment for GERD with Protonix

## 2017-09-19 NOTE — Assessment & Plan Note (Signed)
Progressive,  Now with bilateral leg pain that does not radiate to either ankle,  Right side is more pronounced than left, but she has no deficits on exam..  Trial of meloxicam

## 2017-09-19 NOTE — Assessment & Plan Note (Addendum)
thyroid function is underactive. I will increase heevothyroxine dose to  150 mcg daily .  We should repeat  The TSH in 6 weeks

## 2017-09-19 NOTE — Assessment & Plan Note (Signed)
A1c is now confirmatory of diet controlled diabetes.  I have addressed  BMI and A1c and recommended a low glycemic index diet utilizing smaller more frequent meals to increase metabolism.  I have also recommended that patient change her exercise to a goal of 30 minutes of aerobic exercise a minimum of 5 days per week.   She is already taking lisinopril.  Will not recommend asa as the risks outweigh the benefits per recent ASCEND trial    Lab Results  Component Value Date   HGBA1C 6.5 09/19/2017  . Lab Results  Component Value Date   MICROALBUR 2.8 (H) 09/19/2017

## 2017-09-19 NOTE — Patient Instructions (Addendum)
For your choking spells while sleeping on your back and for stomach protection from the meloxicam   :  Starting protonix  Before dinner for sspected GERD ,   meloxicam once daily for arthritis .  Take in the evening with your protonix   Ok to add tylenol up to 2000 mg daily for additional pain control     Diabetes Mellitus and Exercise Exercising regularly is important for your overall health, especially when you have diabetes (diabetes mellitus). Exercising is not only about losing weight. It has many health benefits, such as increasing muscle strength and bone density and reducing body fat and stress. This leads to improved fitness, flexibility, and endurance, all of which result in better overall health. Exercise has additional benefits for people with diabetes, including:  Reducing appetite.  Helping to lower and control blood glucose.  Lowering blood pressure.  Helping to control amounts of fatty substances (lipids) in the blood, such as cholesterol and triglycerides.  Helping the body to respond better to insulin (improving insulin sensitivity).  Reducing how much insulin the body needs.  Decreasing the risk for heart disease by: ? Lowering cholesterol and triglyceride levels. ? Increasing the levels of good cholesterol. ? Lowering blood glucose levels.  What is my activity plan? Your health care provider or certified diabetes educator can help you make a plan for the type and frequency of exercise (activity plan) that works for you. Make sure that you:  Do at least 150 minutes of moderate-intensity or vigorous-intensity exercise each week. This could be brisk walking, biking, or water aerobics. ? Do stretching and strength exercises, such as yoga or weightlifting, at least 2 times a week. ? Spread out your activity over at least 3 days of the week.  Get some form of physical activity every day. ? Do not go more than 2 days in a row without some kind of physical  activity. ? Avoid being inactive for more than 90 minutes at a time. Take frequent breaks to walk or stretch.  Choose a type of exercise or activity that you enjoy, and set realistic goals.  Start slowly, and gradually increase the intensity of your exercise over time.  What do I need to know about managing my diabetes?  Check your blood glucose before and after exercising. ? If your blood glucose is higher than 240 mg/dL (13.3 mmol/L) before you exercise, check your urine for ketones. If you have ketones in your urine, do not exercise until your blood glucose returns to normal.  Know the symptoms of low blood glucose (hypoglycemia) and how to treat it. Your risk for hypoglycemia increases during and after exercise. Common symptoms of hypoglycemia can include: ? Hunger. ? Anxiety. ? Sweating and feeling clammy. ? Confusion. ? Dizziness or feeling light-headed. ? Increased heart rate or palpitations. ? Blurry vision. ? Tingling or numbness around the mouth, lips, or tongue. ? Tremors or shakes. ? Irritability.  Keep a rapid-acting carbohydrate snack available before, during, and after exercise to help prevent or treat hypoglycemia.  Avoid injecting insulin into areas of the body that are going to be exercised. For example, avoid injecting insulin into: ? The arms, when playing tennis. ? The legs, when jogging.  Keep records of your exercise habits. Doing this can help you and your health care provider adjust your diabetes management plan as needed. Write down: ? Food that you eat before and after you exercise. ? Blood glucose levels before and after you exercise. ? The type  and amount of exercise you have done. ? When your insulin is expected to peak, if you use insulin. Avoid exercising at times when your insulin is peaking.  When you start a new exercise or activity, work with your health care provider to make sure the activity is safe for you, and to adjust your insulin,  medicines, or food intake as needed.  Drink plenty of water while you exercise to prevent dehydration or heat stroke. Drink enough fluid to keep your urine clear or pale yellow. This information is not intended to replace advice given to you by your health care provider. Make sure you discuss any questions you have with your health care provider. Document Released: 11/27/2003 Document Revised: 03/26/2016 Document Reviewed: 02/16/2016 Elsevier Interactive Patient Education  2018 [place] Maintenance for Postmenopausal Women Menopause is a normal process in which your reproductive ability comes to an end. This process happens gradually over a span of months to years, usually between the ages of 19 and 37. Menopause is complete when you have missed 12 consecutive menstrual periods. It is important to talk with your health care provider about some of the most common conditions that affect postmenopausal women, such as heart disease, cancer, and bone loss (osteoporosis). Adopting a healthy lifestyle and getting preventive care can help to promote your health and wellness. Those actions can also lower your chances of developing some of these common conditions. What should I know about menopause? During menopause, you may experience a number of symptoms, such as:  Moderate-to-severe hot flashes.  Night sweats.  Decrease in sex drive.  Mood swings.  Headaches.  Tiredness.  Irritability.  Memory problems.  Insomnia.  Choosing to treat or not to treat menopausal changes is an individual decision that you make with your health care provider. What should I know about hormone replacement therapy and supplements? Hormone therapy products are effective for treating symptoms that are associated with menopause, such as hot flashes and night sweats. Hormone replacement carries certain risks, especially as you become older. If you are thinking about using estrogen or estrogen with  progestin treatments, discuss the benefits and risks with your health care provider. What should I know about heart disease and stroke? Heart disease, heart attack, and stroke become more likely as you age. This may be due, in part, to the hormonal changes that your body experiences during menopause. These can affect how your body processes dietary fats, triglycerides, and cholesterol. Heart attack and stroke are both medical emergencies. There are many things that you can do to help prevent heart disease and stroke:  Have your blood pressure checked at least every 1-2 years. High blood pressure causes heart disease and increases the risk of stroke.  If you are 27-17 years old, ask your health care provider if you should take aspirin to prevent a heart attack or a stroke.  Do not use any tobacco products, including cigarettes, chewing tobacco, or electronic cigarettes. If you need help quitting, ask your health care provider.  It is important to eat a healthy diet and maintain a healthy weight. ? Be sure to include plenty of vegetables, fruits, low-fat dairy products, and lean protein. ? Avoid eating foods that are high in solid fats, added sugars, or salt (sodium).  Get regular exercise. This is one of the most important things that you can do for your health. ? Try to exercise for at least 150 minutes each week. The type of exercise that you do should  increase your heart rate and make you sweat. This is known as moderate-intensity exercise. ? Try to do strengthening exercises at least twice each week. Do these in addition to the moderate-intensity exercise.  Know your numbers.Ask your health care provider to check your cholesterol and your blood glucose. Continue to have your blood tested as directed by your health care provider.  What should I know about cancer screening? There are several types of cancer. Take the following steps to reduce your risk and to catch any cancer development as  early as possible. Breast Cancer  Practice breast self-awareness. ? This means understanding how your breasts normally appear and feel. ? It also means doing regular breast self-exams. Let your health care provider know about any changes, no matter how small.  If you are 54 or older, have a clinician do a breast exam (clinical breast exam or CBE) every year. Depending on your age, family history, and medical history, it may be recommended that you also have a yearly breast X-ray (mammogram).  If you have a family history of breast cancer, talk with your health care provider about genetic screening.  If you are at high risk for breast cancer, talk with your health care provider about having an MRI and a mammogram every year.  Breast cancer (BRCA) gene test is recommended for women who have family members with BRCA-related cancers. Results of the assessment will determine the need for genetic counseling and BRCA1 and for BRCA2 testing. BRCA-related cancers include these types: ? Breast. This occurs in males or females. ? Ovarian. ? Tubal. This may also be called fallopian tube cancer. ? Cancer of the abdominal or pelvic lining (peritoneal cancer). ? Prostate. ? Pancreatic.  Cervical, Uterine, and Ovarian Cancer Your health care provider may recommend that you be screened regularly for cancer of the pelvic organs. These include your ovaries, uterus, and vagina. This screening involves a pelvic exam, which includes checking for microscopic changes to the surface of your cervix (Pap test).  For women ages 21-65, health care providers may recommend a pelvic exam and a Pap test every three years. For women ages 42-65, they may recommend the Pap test and pelvic exam, combined with testing for human papilloma virus (HPV), every five years. Some types of HPV increase your risk of cervical cancer. Testing for HPV may also be done on women of any age who have unclear Pap test results.  Other health  care providers may not recommend any screening for nonpregnant women who are considered low risk for pelvic cancer and have no symptoms. Ask your health care provider if a screening pelvic exam is right for you.  If you have had past treatment for cervical cancer or a condition that could lead to cancer, you need Pap tests and screening for cancer for at least 20 years after your treatment. If Pap tests have been discontinued for you, your risk factors (such as having a new sexual partner) need to be reassessed to determine if you should start having screenings again. Some women have medical problems that increase the chance of getting cervical cancer. In these cases, your health care provider may recommend that you have screening and Pap tests more often.  If you have a family history of uterine cancer or ovarian cancer, talk with your health care provider about genetic screening.  If you have vaginal bleeding after reaching menopause, tell your health care provider.  There are currently no reliable tests available to screen for ovarian cancer.  Lung Cancer Lung cancer screening is recommended for adults 43-68 years old who are at high risk for lung cancer because of a history of smoking. A yearly low-dose CT scan of the lungs is recommended if you:  Currently smoke.  Have a history of at least 30 pack-years of smoking and you currently smoke or have quit within the past 15 years. A pack-year is smoking an average of one pack of cigarettes per day for one year.  Yearly screening should:  Continue until it has been 15 years since you quit.  Stop if you develop a health problem that would prevent you from having lung cancer treatment.  Colorectal Cancer  This type of cancer can be detected and can often be prevented.  Routine colorectal cancer screening usually begins at age 74 and continues through age 46.  If you have risk factors for colon cancer, your health care provider may  recommend that you be screened at an earlier age.  If you have a family history of colorectal cancer, talk with your health care provider about genetic screening.  Your health care provider may also recommend using home test kits to check for hidden blood in your stool.  A small camera at the end of a tube can be used to examine your colon directly (sigmoidoscopy or colonoscopy). This is done to check for the earliest forms of colorectal cancer.  Direct examination of the colon should be repeated every 5-10 years until age 22. However, if early forms of precancerous polyps or small growths are found or if you have a family history or genetic risk for colorectal cancer, you may need to be screened more often.  Skin Cancer  Check your skin from head to toe regularly.  Monitor any moles. Be sure to tell your health care provider: ? About any new moles or changes in moles, especially if there is a change in a mole's shape or color. ? If you have a mole that is larger than the size of a pencil eraser.  If any of your family members has a history of skin cancer, especially at a young age, talk with your health care provider about genetic screening.  Always use sunscreen. Apply sunscreen liberally and repeatedly throughout the day.  Whenever you are outside, protect yourself by wearing long sleeves, pants, a wide-brimmed hat, and sunglasses.  What should I know about osteoporosis? Osteoporosis is a condition in which bone destruction happens more quickly than new bone creation. After menopause, you may be at an increased risk for osteoporosis. To help prevent osteoporosis or the bone fractures that can happen because of osteoporosis, the following is recommended:  If you are 40-69 years old, get at least 1,000 mg of calcium and at least 600 mg of vitamin D per day.  If you are older than age 58 but younger than age 74, get at least 1,200 mg of calcium and at least 600 mg of vitamin D per  day.  If you are older than age 40, get at least 1,200 mg of calcium and at least 800 mg of vitamin D per day.  Smoking and excessive alcohol intake increase the risk of osteoporosis. Eat foods that are rich in calcium and vitamin D, and do weight-bearing exercises several times each week as directed by your health care provider. What should I know about how menopause affects my mental health? Depression may occur at any age, but it is more common as you become older. Common symptoms of  include:  Low or sad mood.  Changes in sleep patterns.  Changes in appetite or eating patterns.  Feeling an overall lack of motivation or enjoyment of activities that you previously enjoyed.  Frequent crying spells.  Talk with your health care provider if you think that you are experiencing depression. What should I know about immunizations? It is important that you get and maintain your immunizations. These include:  Tetanus, diphtheria, and pertussis (Tdap) booster vaccine.  Influenza every year before the flu season begins.  Pneumonia vaccine.  Shingles vaccine.  Your health care provider may also recommend other immunizations. This information is not intended to replace advice given to you by your health care provider. Make sure you discuss any questions you have with your health care provider. Document Released: 10/29/2005 Document Revised: 03/26/2016 Document Reviewed: 06/10/2015 Elsevier Interactive Patient Education  2018 [employer]  

## 2017-09-19 NOTE — Assessment & Plan Note (Addendum)
Persistent despite management of B12 deficiency. She has a mildly elevated  ESR  And multiple joint complaints. Recommend referral to rheumatology  Lab Results  Component Value Date   ESRSEDRATE 36 (H) 09/19/2017    Lab Results  Component Value Date   VITAMINB12 625 09/19/2017   Lab Results  Component Value Date   WBC 4.8 09/19/2017   HGB 10.1 (L) 09/19/2017   HCT 30.6 (L) 09/19/2017   MCV 88.5 09/19/2017   PLT 353.0 09/19/2017    

## 2017-09-19 NOTE — Assessment & Plan Note (Signed)
Annual comprehensive preventive exam was done as well as an evaluation and management of chronic conditions .  During the course of the visit the patient was educated and counseled about appropriate screening and preventive services including :  diabetes screening, lipid analysis with projected  10 year  risk for CAD , nutrition counseling, breast, cervical and colorectal cancer screening, and recommended immunizations.  Printed recommendations for health maintenance screenings was given 

## 2017-09-19 NOTE — Progress Notes (Signed)
Patient ID: Christine Manning, female    DOB: [birth year]  Age: 71 y.o. MRN: [id number]  The patient is here for annual PREVENTIVE  examination and management of other chronic and acute problems.  DUE FOR FOOT AND EYE EXAM MAMMOGRAM DUE FEB 2019     The risk factors are reflected in the social history.  The roster of all physicians providing medical care to patient - is listed in the Snapshot section of the chart.  Activities of daily living:  The patient is 100% independent in all ADLs: dressing, toileting, feeding as well as independent mobility  Home safety : The patient has smoke detectors in the home. They wear seatbelts.  There are no firearms at home. There is no violence in the home.   There is no risks for hepatitis, STDs or HIV. There is no   history of blood transfusion. They have no travel history to infectious disease endemic areas of the world.  The patient has seen their dentist in the last six month. They have seen their eye doctor in the last year. They admit to slight hearing difficulty with regard to whispered voices and some television programs.  They have deferred audiologic testing in the last year.  They do not  have excessive sun exposure. Discussed the need for sun protection: hats, long sleeves and use of sunscreen if there is significant sun exposure.   Diet: the importance of a healthy diet is discussed. They do have a healthy diet.  The benefits of regular aerobic exercise were discussed. She walks  Daily with husband for about 30 minutes.   States that the hips and the legs hurt after the walk despite a regular walking schedule   Depression screen: there are no signs or vegative symptoms of depression- irritability, change in appetite, anhedonia, sadness/tearfullness.  Cognitive assessment: the patient manages all their financial and personal affairs and is actively engaged. They could relate day,date,year and events; recalled 2/3 objects at 3 minutes;  performed clock-face test normally.  The following portions of the patient's history were reviewed and updated as appropriate: allergies, current medications, past family history, past medical history,  past surgical history, past social history  and problem list.  Visual acuity was not assessed per patient preference since she has regular follow up with her ophthalmologist. Hearing and body mass index were assessed and reviewed.   During the course of the visit the patient was educated and counseled about appropriate screening and preventive services including : fall prevention , diabetes screening, nutrition counseling, colorectal cancer screening, and recommended immunizations.    CC: The primary encounter diagnosis was Encounter for preventive health examination. Diagnoses of Nausea in adult, Vitamin D deficiency, B12 deficiency, Acquired hypothyroidism, Essential hypertension, Type 2 diabetes mellitus with microalbuminuria, without long-term current use of insulin ([place]), Normocytic anemia, Breast cancer screening, and Nocturnal cough were also pertinent to this visit.  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  Treated by Ophthalmology and by Dr Lacinda Axon.  On the waiting list at [facility] in [place]  After getting a a pedicure  for her wedding, she developed thickening and discoloration of toenails.  Has been using OTC remedies for presume onychomycosis,  With no change.  Wants to see a podiatrist in [place] FOR THE PAST MONTH.  Worst with lying down , present with waking up ,  Improve somewhat with movement .  gets stiff with inactivity.  Also has bilateral hip pain aggravated by sleeping on side. Can't sleep on back bc she wakes up choking .  Does not snore (not reported by husband )    LOWER BACK PAIN AS WELL.   New mattress  Has aggravated her low back  pain,  Feels it is too soft.    History Arlen Legendre has a past medical history of Asthma, Colon polyp, DDD (degenerative disc disease), Hyperlipidemia, Hypertension, Hyperthyroidism, and Personal history of DVT (deep vein thrombosis) (1980).   She has a past surgical history that includes Bladder suspension (2003); Abdominal hysterectomy (1991); Coronary angioplasty (2013); Knee arthroscopy; Colonoscopy (2016); and Anal Rectal manometry (N/A, 01/24/2017).   Her family history includes Asthma in her brother, daughter, mother, and son; Breast cancer (age of onset: 40) in her maternal aunt; Colon cancer in her cousin; Colon cancer (age of onset: 64) in her maternal aunt; Colon polyps in her sister; Berenice Primas' disease in her sister; Hernia in her brother; Mental illness in her father and mother; Stroke in her mother; Uterine cancer in her maternal grandmother.She reports that  has never smoked. she has never used smokeless tobacco. She reports that she does not drink alcohol or use drugs.  Outpatient Medications Prior to Visit  Medication Sig Dispense Refill  . albuterol (PROVENTIL HFA;VENTOLIN HFA) 108 (90 Base) MCG/ACT inhaler Inhale 2 puffs into the lungs every 6 (six) hours as needed for wheezing or shortness of breath. 1 Inhaler 3  . albuterol (PROVENTIL) (2.5 MG/3ML) 0.083% nebulizer solution Take 3 mLs (2.5 mg total) by nebulization every 6 (six) hours as needed for wheezing or shortness of breath. 150 mL 1  . aspirin 81 MG tablet Take 81 mg by mouth daily.    . Cyanocobalamin (VITAMIN B-12 IJ) Inject 1 each as directed every 28 (twenty-eight) days.    Marland Kitchen gabapentin (NEURONTIN) 300 MG capsule Take 1 capsule (300 mg total) by mouth 3 (three) times daily as needed. 90 capsule 0  . Lactobacillus-Inulin ([place]) CAPS Take 1 capsule by mouth daily. 14 capsule 0  . lisinopril (PRINIVIL,ZESTRIL) 40 MG tablet Take 1 tablet (40 mg total) by mouth daily. 90 tablet 1  . loratadine (CLARITIN)  10 MG tablet Take 1 tablet (10 mg total) by mouth daily. 30 tablet 0  . metoprolol tartrate (LOPRESSOR) 25 MG tablet Take 1 tablet (25 mg total) by mouth 2 (two) times daily. 30 tablet 5  . mometasone (NASONEX) 50 MCG/ACT nasal spray Place 2 sprays into the nose daily.    . Multiple Vitamin (MULTIVITAMIN) tablet Take 1 tablet by mouth daily.    . ondansetron (ZOFRAN) 4 MG tablet Take 1 tablet (4 mg total) by mouth as needed for nausea or vomiting. 30 tablet 5  . simvastatin (ZOCOR) 40 MG tablet Take 1 tablet (40 mg total) by mouth every evening. 90 tablet 3  . levothyroxine (SYNTHROID, LEVOTHROID) 125 MCG tablet TAKE ONE TABLET BY MOUTH ONCE DAILY BEFORE BREAKFAST 30 tablet 0  . pantoprazole (PROTONIX) 40 MG tablet Take 1 tablet (40 mg total) by mouth daily. 30 tablet 3  . Vitamin D, Ergocalciferol, (DRISDOL) 50000 units CAPS capsule Take 1 capsule (50,000 Units total) by mouth every 7 (seven) days. 4 capsule 3  . levothyroxine (SYNTHROID, LEVOTHROID) 125 MCG tablet Take 1 tablet (125 mcg total) by mouth daily before breakfast. (Patient not taking: Reported on 09/19/2017) 30 tablet 1   No facility-administered medications prior  to visit.     Review of Systems   Patient denies headache, fevers, malaise, unintentional weight loss, skin rash, eye pain, sinus congestion and sinus pain, sore throat, dysphagia,  hemoptysis , cough, dyspnea, wheezing, chest pain, palpitations, orthopnea, edema, abdominal pain, nausea, melena, diarrhea, constipation, flank pain, dysuria, hematuria, urinary  Frequency, nocturia, numbness, tingling, seizures,  Focal weakness, Loss of consciousness,  Tremor, insomnia, depression, anxiety, and suicidal ideation.     Objective:  BP 136/88 (BP Location: Left Arm, Patient Position: Sitting, Cuff Size: Normal)   Pulse (!) 55   Temp 98.1 F (36.7 C) (Oral)   Resp 16   Ht _0  (1.473 m)   Wt 148 lb 6.4 oz (67.3 kg)   SpO2 99%   BMI 31.02 kg/m   Physical Exam    General appearance: alert, cooperative and appears stated age Head: Normocephalic, without obvious abnormality, atraumatic Eyes: conjunctivae/corneas clear. PERRL, EOM's intact. Fundi benign. Ears: normal TM's and external ear canals both ears Nose: Nares normal. Septum midline. Mucosa normal. No drainage or sinus tenderness. Throat: lips, mucosa, and tongue normal; teeth and gums normal Neck: no adenopathy, no carotid bruit, no JVD, supple, symmetrical, trachea midline and thyroid not enlarged, symmetric, no tenderness/mass/nodules Lungs: clear to auscultation bilaterally Breasts: normal appearance, no masses or tenderness Heart: regular rate and rhythm, S1, S2 normal, no murmur, click, rub or gallop Abdomen: soft, non-tender; bowel sounds normal; no masses,  no organomegaly Extremities: extremities normal, atraumatic, no cyanosis or edema Pulses: 2+ and symmetric Skin: Skin color, texture, turgor normal. No rashes or lesions Neurologic: Alert and oriented X 3, normal strength and tone. Normal symmetric reflexes. Normal coordination and gait.      Assessment & Plan:   Problem List Items Addressed This Visit    B12 deficiency   Relevant Orders   Vitamin B12 (Completed)   Encounter for preventive health examination - Primary    Annual comprehensive preventive exam was done as well as an evaluation and management of chronic conditions .  During the course of the visit the patient was educated and counseled about appropriate screening and preventive services including :  diabetes screening, lipid analysis with projected  10 year  risk for CAD , nutrition counseling, breast, cervical and colorectal cancer screening, and recommended immunizations.  Printed recommendations for health maintenance screenings was given      Hypertension    Well controlled on current regimen. Renal function stable, no changes today.  Lab Results  Component Value Date   CREATININE 0.98 09/19/2017   Lab  Results  Component Value Date   NA 141 09/19/2017   K 4.1 09/19/2017   CL 105 09/19/2017   CO2 28 09/19/2017         Hypothyroidism     thyroid function is underactive. I will increase heevothyroxine dose to  150 mcg daily .  We should repeat  The TSH in 6 weeks       Relevant Medications   levothyroxine (SYNTHROID, LEVOTHROID) 150 MCG tablet   Other Relevant Orders   TSH (Completed)   Nocturnal cough    Empiric treatment for GERD with Protonix       Normocytic anemia    Persistent despite management of B12 deficiency. She has a mildly elevated  ESR  And multiple joint complaints. Recommend referral to rheumatology  Lab Results  Component Value Date   ESRSEDRATE 11 (H) 09/19/2017    Lab Results  Component Value Date   [id number] 063 09/19/2017  Lab Results  Component Value Date   WBC 4.8 09/19/2017   HGB 10.1 (L) 09/19/2017   HCT 30.6 (L) 09/19/2017   MCV 88.5 09/19/2017   PLT 353.0 09/19/2017         Relevant Orders   Sedimentation rate (Completed)   CBC with Differential/Platelet (Completed)   Type 2 diabetes mellitus with microalbuminuria (HCC)    A1c is now confirmatory of diet controlled diabetes.  I have addressed  BMI and A1c and recommended a low glycemic index diet utilizing smaller more frequent meals to increase metabolism.  I have also recommended that patient change her exercise to a goal of 30 minutes of aerobic exercise a minimum of 5 days per week.   She is already taking lisinopril.  Will not recommend asa as the risks outweigh the benefits per recent ASCEND trial    Lab Results  Component Value Date   HGBA1C 6.5 09/19/2017  . Lab Results  Component Value Date   MICROALBUR 2.8 (H) 09/19/2017          Relevant Orders   Hemoglobin A1c (Completed)   Microalbumin / creatinine urine ratio (Completed)   Lipid panel (Completed)   Comprehensive metabolic panel (Completed)   Vitamin D deficiency    Repeating Drisdol dose weekly for 4  months given recurrence       Relevant Orders   VITAMIN D 25 Hydroxy (Vit-D Deficiency, Fractures) (Completed)    Other Visit Diagnoses    Nausea in adult       Relevant Medications   pantoprazole (PROTONIX) 40 MG tablet   Breast cancer screening       Relevant Orders   MM SCREENING BREAST TOMO BILATERAL      I have discontinued Shawnie Pons Eggenberger's Vitamin D (Ergocalciferol), levothyroxine, and levothyroxine. I have also changed her pantoprazole. Additionally, I am having her start on meloxicam, levothyroxine, and ergocalciferol. Lastly, I am having her maintain her mometasone, aspirin, CULTURELLE DIGESTIVE HEALTH, multivitamin, loratadine, Cyanocobalamin (VITAMIN B-12 IJ), albuterol, albuterol, simvastatin, ondansetron, metoprolol tartrate, gabapentin, and lisinopril.  Meds ordered this encounter  Medications  . pantoprazole (PROTONIX) 40 MG tablet    Sig: Take 1 tablet (40 mg total) by mouth daily. 30 minutes Before dinner    Dispense:  30 tablet    Refill:  3  . meloxicam (MOBIC) 15 MG tablet    Sig: Take 1 tablet (15 mg total) by mouth daily.    Dispense:  30 tablet    Refill:  2  . levothyroxine (SYNTHROID, LEVOTHROID) 150 MCG tablet    Sig: Take 1 tablet (150 mcg total) by mouth daily.    Dispense:  90 tablet    Refill:  0  . ergocalciferol (DRISDOL) 50000 units capsule    Sig: Take 1 capsule (50,000 Units total) by mouth once a week.    Dispense:  4 capsule    Refill:  3    Medications Discontinued During This Encounter  Medication Reason  . levothyroxine (SYNTHROID, LEVOTHROID) 315 MCG tablet Duplicate  . pantoprazole (PROTONIX) 40 MG tablet Reorder  . levothyroxine (SYNTHROID, LEVOTHROID) 125 MCG tablet   . Vitamin D, Ergocalciferol, (DRISDOL) 50000 units CAPS capsule     Follow-up: Return in about 3 months (around 12/18/2017) for follow up diabetes.   Crecencio Mc, MD

## 2017-09-19 NOTE — Assessment & Plan Note (Signed)
Well controlled on current regimen. Renal function stable, no changes today.  Lab Results  Component Value Date   CREATININE 0.98 09/19/2017   Lab Results  Component Value Date   NA 141 09/19/2017   K 4.1 09/19/2017   CL 105 09/19/2017   CO2 28 09/19/2017

## 2017-09-26 ENCOUNTER — Other Ambulatory Visit: Payer: Self-pay

## 2017-09-26 DIAGNOSIS — Z86718 Personal history of other venous thrombosis and embolism: Secondary | ICD-10-CM | POA: Insufficient documentation

## 2017-09-26 DIAGNOSIS — E1169 Type 2 diabetes mellitus with other specified complication: Secondary | ICD-10-CM | POA: Insufficient documentation

## 2017-09-26 DIAGNOSIS — I152 Hypertension secondary to endocrine disorders: Secondary | ICD-10-CM | POA: Insufficient documentation

## 2017-09-26 DIAGNOSIS — E1159 Type 2 diabetes mellitus with other circulatory complications: Secondary | ICD-10-CM | POA: Insufficient documentation

## 2017-09-26 MED ORDER — LEVOTHYROXINE SODIUM 150 MCG PO TABS: 150.0000 ug | ORAL_TABLET | Freq: Every day | ORAL | 0 refills | Status: DC

## 2017-09-27 ENCOUNTER — Telehealth: Payer: Self-pay | Admitting: Internal Medicine

## 2017-09-27 NOTE — Telephone Encounter (Signed)
Results given and documented on the result note.  Lab appointment made.

## 2017-09-27 NOTE — Telephone Encounter (Signed)
Copied from [phone] [id number] [id number]. Topic: Quick Communication - Lab Results >> Sep 27, 2017 10:11 AM Corie Chiquito, NT wrote: Patient called to get her lab results. If someone could give her a call back at [phone]

## 2017-09-27 NOTE — Telephone Encounter (Signed)
levothyroxine (SYNTHROID, LEVOTHROID) 150 MCG tablet 90 tablet 0 09/26/2017    Sig - Route: Take 1 tablet (150 mcg total) by mouth daily. - Oral   Sent to pharmacy as: levothyroxine (SYNTHROID, LEVOTHROID) 150 MCG tablet   E-Prescribing Status: Receipt confirmed by pharmacy (09/26/2017 1:39 PM EST)   Pharmacy   [facility] [place], [address] [place] patient and advised that medication has been sent to pharmacy /

## 2017-09-27 NOTE — Telephone Encounter (Signed)
Copied from [place] [id number]. Topic: Quick Communication - See Telephone Encounter >> Sep 27, 2017 10:15 AM Corie Chiquito, NT wrote: CRM for notification. See Telephone encounter for: Patient calling because she still needs a refill on her Levothyroxine. Stated that she has been trying to get this refill for the last 2 weeks and she is driving down from [place] and still no medication. If someone could give her a call back about this at [phone]  09/27/17.

## 2017-11-08 ENCOUNTER — Other Ambulatory Visit (INDEPENDENT_AMBULATORY_CARE_PROVIDER_SITE_OTHER): Payer: Self-pay

## 2017-11-08 DIAGNOSIS — E039 Hypothyroidism, unspecified: Secondary | ICD-10-CM | POA: Diagnosis not present

## 2017-11-08 DIAGNOSIS — D649 Anemia, unspecified: Secondary | ICD-10-CM | POA: Diagnosis not present

## 2017-11-08 LAB — CBC WITH DIFFERENTIAL/PLATELET
Basophils Absolute: 0 10*3/uL (ref 0.0–0.1)
Basophils Relative: 1.1 % (ref 0.0–3.0)
EOS PCT: 1.8 % (ref 0.0–5.0)
Eosinophils Absolute: 0.1 10*3/uL (ref 0.0–0.7)
HCT: 32.1 % — ABNORMAL LOW (ref 36.0–46.0)
HEMOGLOBIN: 10.3 g/dL — AB (ref 12.0–15.0)
Lymphocytes Relative: 46.2 % — ABNORMAL HIGH (ref 12.0–46.0)
Lymphs Abs: 2 10*3/uL (ref 0.7–4.0)
MCHC: 32.1 g/dL (ref 30.0–36.0)
MCV: 84.6 fl (ref 78.0–100.0)
MONO ABS: 0.3 10*3/uL (ref 0.1–1.0)
MONOS PCT: 6.9 % (ref 3.0–12.0)
Neutro Abs: 1.9 10*3/uL (ref 1.4–7.7)
Neutrophils Relative %: 44 % (ref 43.0–77.0)
Platelets: 385 10*3/uL (ref 150.0–400.0)
RBC: 3.8 Mil/uL — AB (ref 3.87–5.11)
RDW: 15.1 % (ref 11.5–15.5)
WBC: 4.4 10*3/uL (ref 4.0–10.5)

## 2017-11-08 LAB — TSH: TSH: 4.35 u[IU]/mL (ref 0.35–4.50)

## 2017-11-11 LAB — ANA: ANA: NEGATIVE

## 2017-11-11 LAB — RHEUMATOID FACTOR: Rhuematoid fact SerPl-aCnc: <14 IU/mL (ref <14)

## 2017-11-14 ENCOUNTER — Encounter: Payer: Self-pay | Admitting: *Deleted

## 2017-12-06 ENCOUNTER — Ambulatory Visit (INDEPENDENT_AMBULATORY_CARE_PROVIDER_SITE_OTHER): Payer: PRIVATE HEALTH INSURANCE | Admitting: *Deleted

## 2017-12-06 DIAGNOSIS — E538 Deficiency of other specified B group vitamins: Secondary | ICD-10-CM

## 2017-12-06 MED ORDER — CYANOCOBALAMIN 1000 MCG/ML IJ SOLN
1000.0000 ug | Freq: Once | INTRAMUSCULAR | Status: AC
  Administered 2017-12-06: 1000 ug via INTRAMUSCULAR

## 2017-12-06 NOTE — Progress Notes (Signed)
Patient presented for B 12 injection to left deltoid, patient voiced no concerns nor showed any signs of distress during injection. 

## 2017-12-09 ENCOUNTER — Ambulatory Visit (INDEPENDENT_AMBULATORY_CARE_PROVIDER_SITE_OTHER): Payer: PRIVATE HEALTH INSURANCE | Admitting: Internal Medicine

## 2017-12-09 ENCOUNTER — Ambulatory Visit (INDEPENDENT_AMBULATORY_CARE_PROVIDER_SITE_OTHER): Payer: PRIVATE HEALTH INSURANCE

## 2017-12-09 ENCOUNTER — Encounter: Payer: Self-pay | Admitting: Internal Medicine

## 2017-12-09 VITALS — BP 122/84 | HR 65 | Temp 97.8°F | Resp 15 | Ht <= 58 in | Wt 147.0 lb

## 2017-12-09 DIAGNOSIS — M25552 Pain in left hip: Secondary | ICD-10-CM

## 2017-12-09 DIAGNOSIS — G8929 Other chronic pain: Secondary | ICD-10-CM

## 2017-12-09 DIAGNOSIS — M25561 Pain in right knee: Secondary | ICD-10-CM

## 2017-12-09 DIAGNOSIS — M542 Cervicalgia: Secondary | ICD-10-CM

## 2017-12-09 DIAGNOSIS — M25551 Pain in right hip: Secondary | ICD-10-CM | POA: Diagnosis not present

## 2017-12-09 DIAGNOSIS — D649 Anemia, unspecified: Secondary | ICD-10-CM

## 2017-12-09 DIAGNOSIS — E039 Hypothyroidism, unspecified: Secondary | ICD-10-CM

## 2017-12-09 DIAGNOSIS — E1129 Type 2 diabetes mellitus with other diabetic kidney complication: Secondary | ICD-10-CM

## 2017-12-09 DIAGNOSIS — E782 Mixed hyperlipidemia: Secondary | ICD-10-CM

## 2017-12-09 DIAGNOSIS — F419 Anxiety disorder, unspecified: Secondary | ICD-10-CM | POA: Diagnosis not present

## 2017-12-09 DIAGNOSIS — M5030 Other cervical disc degeneration, unspecified cervical region: Secondary | ICD-10-CM

## 2017-12-09 DIAGNOSIS — R809 Proteinuria, unspecified: Secondary | ICD-10-CM

## 2017-12-09 MED ORDER — GABAPENTIN 300 MG PO CAPS: 300.0000 mg | ORAL_CAPSULE | Freq: Three times a day (TID) | ORAL | 5 refills | Status: AC | PRN

## 2017-12-09 MED ORDER — MELOXICAM 7.5 MG PO TABS: 7.5000 mg | ORAL_TABLET | Freq: Every day | ORAL | 1 refills | Status: DC

## 2017-12-09 NOTE — Patient Instructions (Addendum)
Meloxicam once daily  Tylenol  Use up to 2000  Mg daily safely  gabapentin 300  MG UP TO 3 TIMES DAILY IF TOLERATED  MRI CERVICAL SPINE TO BE ORDERED AT [facility]

## 2017-12-09 NOTE — Progress Notes (Signed)
Subjective:  Patient ID: Christine Manning, female    DOB: 05-Aug-1946  Age: 72 y.o. MRN: [id number]  CC: The primary encounter diagnosis was Chronic pain of right knee. Diagnoses of Hip pain, bilateral, Anxiety, Mixed hyperlipidemia, Type 2 diabetes mellitus with microalbuminuria, without long-term current use of insulin ([place]), Anemia, unspecified type, Acquired hypothyroidism, Other cervical disc degeneration, unspecified cervical region, and Cervicalgia were also pertinent to this visit.  HPI [name] Laurella Tull presents for follow up on multiple chronic issues including T2DM,  Hypothyroid,  Hypertension. Marland Kitchen  Has multiple issues since her last visit in December    1) fingertip  numbness involving left hand for one week.  Intermittent . Noticed it while driving initially.  Not while sleeping .  Also brought on by rasing her left arm .  Also has neck pain worse in the morning      2) chronic back pain. .  N .  Has pain in hip brought on by lying on either hip   3) anterior leg pain  Radiates to calves and ankles brought on by walking  Worse when lying in bed any position .  Taking gabapentin prn which helps  She has history of DDD of cervical spine at all levels,  And lumbar changes as well .  No recent MRI .  Wants to have [facility] at [facility] for [facility]   Anemia:  She has been taking an iron suppleent since Feb labs.    Outpatient Medications Prior to Visit  Medication Sig Dispense Refill  . aspirin 81 MG tablet Take 81 mg by mouth daily.    . Cyanocobalamin (VITAMIN B-12 IJ) Inject 1 each as directed every 28 (twenty-eight) days.    . ergocalciferol (DRISDOL) 50000 units capsule Take 1 capsule (50,000 Units total) by mouth once a week. 4 capsule 3  . Lactobacillus-Inulin ([place]) CAPS Take 1 capsule by mouth daily. 14 capsule 0  . levothyroxine (SYNTHROID, LEVOTHROID) 150 MCG tablet Take 1 tablet (150 mcg total) by mouth daily. 90 tablet 0  . lisinopril  (PRINIVIL,ZESTRIL) 40 MG tablet Take 1 tablet (40 mg total) by mouth daily. 90 tablet 1  . loratadine (CLARITIN) 10 MG tablet Take 1 tablet (10 mg total) by mouth daily. 30 tablet 0  . metoprolol tartrate (LOPRESSOR) 25 MG tablet Take 1 tablet (25 mg total) by mouth 2 (two) times daily. 30 tablet 5  . mometasone (NASONEX) 50 MCG/ACT nasal spray Place 2 sprays into the nose daily.    . Multiple Vitamin (MULTIVITAMIN) tablet Take 1 tablet by mouth daily.    . ondansetron (ZOFRAN) 4 MG tablet Take 1 tablet (4 mg total) by mouth as needed for nausea or vomiting. 30 tablet 5  . pantoprazole (PROTONIX) 40 MG tablet Take 1 tablet (40 mg total) by mouth daily. 30 minutes Before dinner 30 tablet 3  . simvastatin (ZOCOR) 40 MG tablet Take 1 tablet (40 mg total) by mouth every evening. 90 tablet 3  . albuterol (PROVENTIL HFA;VENTOLIN HFA) 108 (90 Base) MCG/ACT inhaler Inhale 2 puffs into the lungs every 6 (six) hours as needed for wheezing or shortness of breath. 1 Inhaler 3  . albuterol (PROVENTIL) (2.5 MG/3ML) 0.083% nebulizer solution Take 3 mLs (2.5 mg total) by nebulization every 6 (six) hours as needed for wheezing or shortness of breath. 150 mL 1  . gabapentin (NEURONTIN) 300 MG capsule Take 1 capsule (300 mg total) by mouth 3 (three) times daily as needed.  90 capsule 0  . meloxicam (MOBIC) 15 MG tablet Take 1 tablet (15 mg total) by mouth daily. 30 tablet 2   No facility-administered medications prior to visit.     Review of Systems;  Patient denies headache, fevers, malaise, unintentional weight loss, skin rash, eye pain, sinus congestion and sinus pain, sore throat, dysphagia,  hemoptysis , cough, dyspnea, wheezing, chest pain, palpitations, orthopnea, edema, abdominal pain, nausea, melena, diarrhea, constipation, flank pain, dysuria, hematuria, urinary  Frequency, nocturia, numbness, tingling, seizures,  Focal weakness, Loss of consciousness,  Tremor, insomnia, depression, anxiety, and suicidal  ideation.      Objective:  BP 122/84 (BP Location: Left Arm, Patient Position: Sitting, Cuff Size: Large)   Pulse 65   Temp 97.8 F (36.6 C) (Oral)   Resp 15   Ht 4\' 10"  (1.473 m)   Wt 147 lb (66.7 kg)   SpO2 98%   BMI 30.72 kg/m   BP Readings from Last 3 Encounters:  12/09/17 122/84  09/19/17 136/88  05/10/17 (!) 158/84    Wt Readings from Last 3 Encounters:  12/09/17 147 lb (66.7 kg)  09/19/17 148 lb 6.4 oz (67.3 kg)  05/10/17 143 lb 3 oz (64.9 kg)    General appearance: alert, cooperative and appears stated age Ears: normal TM's and external ear canals both ears Throat: lips, mucosa, and tongue normal; teeth and gums normal Neck: no adenopathy, no carotid bruit, supple, symmetrical, trachea midline and thyroid not enlarged, symmetric, no tenderness/mass/nodules Back: symmetric, no curvature. ROM normal. No CVA tenderness. Lungs: clear to auscultation bilaterally Heart: regular rate and rhythm, S1, S2 normal, no murmur, click, rub or gallop Abdomen: soft, non-tender; bowel sounds normal; no masses,  no organomegaly Pulses: 2+ and symmetric Skin: Skin color, texture, turgor normal. No rashes or lesions Lymph nodes: Cervical, supraclavicular, and axillary nodes normal.  Lab Results  Component Value Date   HGBA1C 6.5 09/19/2017   HGBA1C 6.6 (H) 03/17/2017    Lab Results  Component Value Date   CREATININE 0.98 09/19/2017   CREATININE 0.99 03/17/2017   CREATININE 1.16 09/15/2016    Lab Results  Component Value Date   WBC 4.4 11/08/2017   HGB 10.3 (L) 11/08/2017   HCT 32.1 (L) 11/08/2017   PLT 385.0 11/08/2017   GLUCOSE 93 09/19/2017   CHOL 216 (H) 09/19/2017   TRIG 188.0 (H) 09/19/2017   HDL 47.50 09/19/2017   LDLDIRECT 165.0 09/10/2015   LDLCALC 130 (H) 09/19/2017   ALT 13 09/19/2017   AST 20 09/19/2017   NA 141 09/19/2017   K 4.1 09/19/2017   CL 105 09/19/2017   CREATININE 0.98 09/19/2017   BUN 9 09/19/2017   CO2 28 09/19/2017   TSH 4.35  11/08/2017   HGBA1C 6.5 09/19/2017   MICROALBUR 2.8 (H) 09/19/2017    No results found.  Assessment & Plan:   Problem List Items Addressed This Visit    Anxiety   Hyperlipidemia   Hypothyroidism   Relevant Orders   TSH   Type 2 diabetes mellitus with microalbuminuria (HCC)    Last A1c was confirmatory of diet controlled diabetes.  I have addressed  BMI and A1c and recommended a low glycemic index diet utilizing smaller more frequent meals to increase metabolism.  I have also recommended that patient change her exercise to a goal of 30 minutes of aerobic exercise a minimum of 5 days per week.   She is already taking lisinopril.  Will not recommend asa as the risks outweigh the  benefits per recent ASCEND trial    Lab Results  Component Value Date   HGBA1C 6.5 09/19/2017  . Lab Results  Component Value Date   MICROALBUR 2.8 (H) 09/19/2017          Relevant Orders   Hemoglobin A1c   Lipid panel   Microalbumin / creatinine urine ratio    Other Visit Diagnoses    Chronic pain of right knee    -  Primary   Relevant Orders   DG Knee Complete 4 Views Right (Completed)   Hip pain, bilateral       Relevant Orders   DG HIPS BILAT WITH PELVIS 3-4 VIEWS (Completed)   Anemia, unspecified type       Relevant Orders   CBC with Differential/Platelet   Iron, TIBC and Ferritin Panel   Other cervical disc degeneration, unspecified cervical region       Relevant Medications   meloxicam (MOBIC) 7.5 MG tablet   Other Relevant Orders   MR Cervical Spine Wo Contrast   Cervicalgia       Relevant Orders   MR Cervical Spine Wo Contrast      I have discontinued Shawnie Pons Tessmer's albuterol, albuterol, and meloxicam. I am also having her start on meloxicam. Additionally, I am having her maintain her mometasone, aspirin, CULTURELLE DIGESTIVE HEALTH, multivitamin, loratadine, Cyanocobalamin (VITAMIN B-12 IJ), simvastatin, ondansetron, metoprolol tartrate, lisinopril, pantoprazole,  ergocalciferol, levothyroxine, and gabapentin.  Meds ordered this encounter  Medications  . meloxicam (MOBIC) 7.5 MG tablet    Sig: Take 1 tablet (7.5 mg total) by mouth daily.    Dispense:  90 tablet    Refill:  1  . gabapentin (NEURONTIN) 300 MG capsule    Sig: Take 1 capsule (300 mg total) by mouth 3 (three) times daily as needed.    Dispense:  90 capsule    Refill:  5    Medications Discontinued During This Encounter  Medication Reason  . meloxicam (MOBIC) 15 MG tablet   . gabapentin (NEURONTIN) 300 MG capsule Reorder  . albuterol (PROVENTIL HFA;VENTOLIN HFA) 108 (90 Base) MCG/ACT inhaler   . albuterol (PROVENTIL) (2.5 MG/3ML) 0.083% nebulizer solution     Follow-up: Return in about 2 weeks (around 12/26/2017) for follow up diabetes, FASTING Labs after april .   Crecencio Mc, MD

## 2017-12-11 NOTE — Assessment & Plan Note (Signed)
Last A1c was confirmatory of diet controlled diabetes.  I have addressed  BMI and A1c and recommended a low glycemic index diet utilizing smaller more frequent meals to increase metabolism.  I have also recommended that patient change her exercise to a goal of 30 minutes of aerobic exercise a minimum of 5 days per week.   She is already taking lisinopril.  Will not recommend asa as the risks outweigh the benefits per recent ASCEND trial    Lab Results  Component Value Date   HGBA1C 6.5 09/19/2017  . Lab Results  Component Value Date   MICROALBUR 2.8 (H) 09/19/2017

## 2017-12-11 NOTE — Assessment & Plan Note (Signed)
Progressive,  Now with bilateral leg pain that radiates to both ankles with walking and is now constant.  She is also now reporting left hand numbness involving all fingers.  MRi of cervical spine needs to be done .  Trial of meloxicam and gabapentin

## 2017-12-13 ENCOUNTER — Telehealth: Payer: Self-pay | Admitting: Internal Medicine

## 2017-12-13 NOTE — Telephone Encounter (Signed)
Not able to schedule the MRI per her [employer]. They state that Dr. Derrel Nip is not contracted with this particular [employer] so we are not able to authorize or schedule anything. She will need to establish with the provider that is listed on her [employer] card.

## 2017-12-13 NOTE — Telephone Encounter (Signed)
Copied from [place]. Topic: Quick Communication - See Telephone Encounter >> Dec 13, 2017  8:47 AM Johna Sheriff, CMA wrote: CRM for notification. See Telephone encounter for: 12/13/17.patient calling to check status of referral

## 2017-12-19 ENCOUNTER — Ambulatory Visit: Payer: Medicare Other | Admitting: Internal Medicine

## 2017-12-22 ENCOUNTER — Other Ambulatory Visit (INDEPENDENT_AMBULATORY_CARE_PROVIDER_SITE_OTHER): Payer: Medicare HMO

## 2017-12-22 DIAGNOSIS — R809 Proteinuria, unspecified: Secondary | ICD-10-CM

## 2017-12-22 DIAGNOSIS — E1129 Type 2 diabetes mellitus with other diabetic kidney complication: Secondary | ICD-10-CM | POA: Diagnosis not present

## 2017-12-22 DIAGNOSIS — D649 Anemia, unspecified: Secondary | ICD-10-CM | POA: Diagnosis not present

## 2017-12-22 DIAGNOSIS — E039 Hypothyroidism, unspecified: Secondary | ICD-10-CM

## 2017-12-22 LAB — LIPID PANEL
Cholesterol: 157 mg/dL (ref 0–200)
HDL: 53.1 mg/dL (ref >39.00)
LDL CALC: 79 mg/dL (ref 0–99)
NONHDL: 104.04
Total CHOL/HDL Ratio: 3
Triglycerides: 127 mg/dL (ref 0.0–149.0)
VLDL: 25.4 mg/dL (ref 0.0–40.0)

## 2017-12-22 LAB — CBC WITH DIFFERENTIAL/PLATELET
BASOS PCT: 1.4 % (ref 0.0–3.0)
Basophils Absolute: 0.1 10*3/uL (ref 0.0–0.1)
EOS ABS: 0.1 10*3/uL (ref 0.0–0.7)
Eosinophils Relative: 2.4 % (ref 0.0–5.0)
HEMATOCRIT: 32.3 % — AB (ref 36.0–46.0)
Hemoglobin: 10.5 g/dL — ABNORMAL LOW (ref 12.0–15.0)
LYMPHS PCT: 30.6 % (ref 12.0–46.0)
Lymphs Abs: 1.7 10*3/uL (ref 0.7–4.0)
MCHC: 32.6 g/dL (ref 30.0–36.0)
MCV: 82.4 fl (ref 78.0–100.0)
MONO ABS: 0.5 10*3/uL (ref 0.1–1.0)
Monocytes Relative: 8.5 % (ref 3.0–12.0)
Neutro Abs: 3.3 10*3/uL (ref 1.4–7.7)
Neutrophils Relative %: 57.1 % (ref 43.0–77.0)
Platelets: 323 10*3/uL (ref 150.0–400.0)
RBC: 3.92 Mil/uL (ref 3.87–5.11)
RDW: 16.7 % — AB (ref 11.5–15.5)
WBC: 5.7 10*3/uL (ref 4.0–10.5)

## 2017-12-22 LAB — MICROALBUMIN / CREATININE URINE RATIO
CREATININE, U: 160.9 mg/dL
MICROALB UR: 1 mg/dL (ref 0.0–1.9)
MICROALB/CREAT RATIO: 0.6 mg/g (ref 0.0–30.0)

## 2017-12-22 LAB — HEMOGLOBIN A1C: HEMOGLOBIN A1C: 6.8 % — AB (ref 4.6–6.5)

## 2017-12-22 LAB — IRON,TIBC AND FERRITIN PANEL
%SAT: 15 % (calc) (ref 11–50)
FERRITIN: 9 ng/mL — AB (ref 20–288)
Iron: 59 ug/dL (ref 45–160)
TIBC: 389 ug/dL (ref 250–450)

## 2017-12-22 LAB — TSH: TSH: 0.77 u[IU]/mL (ref 0.35–4.50)

## 2017-12-26 ENCOUNTER — Ambulatory Visit (INDEPENDENT_AMBULATORY_CARE_PROVIDER_SITE_OTHER): Payer: PRIVATE HEALTH INSURANCE | Admitting: Internal Medicine

## 2017-12-26 ENCOUNTER — Encounter: Payer: Self-pay | Admitting: Internal Medicine

## 2017-12-26 DIAGNOSIS — M5441 Lumbago with sciatica, right side: Secondary | ICD-10-CM | POA: Diagnosis not present

## 2017-12-26 DIAGNOSIS — G8929 Other chronic pain: Secondary | ICD-10-CM | POA: Diagnosis not present

## 2017-12-26 DIAGNOSIS — R809 Proteinuria, unspecified: Secondary | ICD-10-CM

## 2017-12-26 DIAGNOSIS — M542 Cervicalgia: Secondary | ICD-10-CM | POA: Diagnosis not present

## 2017-12-26 DIAGNOSIS — D649 Anemia, unspecified: Secondary | ICD-10-CM | POA: Diagnosis not present

## 2017-12-26 DIAGNOSIS — E1129 Type 2 diabetes mellitus with other diabetic kidney complication: Secondary | ICD-10-CM

## 2017-12-26 MED ORDER — ALBUTEROL SULFATE HFA 108 (90 BASE) MCG/ACT IN AERS: 2.0000 | INHALATION_SPRAY | Freq: Four times a day (QID) | RESPIRATORY_TRACT | 0 refills | Status: DC | PRN

## 2017-12-26 MED ORDER — PREDNISONE 10 MG PO TABS
ORAL_TABLET | ORAL | 0 refills | Status: DC
Start: 2017-12-26 — End: 2023-11-10

## 2017-12-26 NOTE — Patient Instructions (Addendum)
Consider Neurology or an Orthopedics  referral for me to place to get your MRI ordered from them   Take the iron once daily  with 6 ounces of orange juice  Repeat iron and CBC in 3 months  Your diabetes is under excellent control currently. You do not to check your sugars unless you want to see 2 hour post prandil effect after a particular meal

## 2017-12-26 NOTE — Progress Notes (Signed)
Subjective:  Patient ID: Christine Manning, female    DOB: 1946-03-29  Age: 72 y.o. MRN: [id number]  CC: Diagnoses of Normocytic anemia, Chronic right-sided low back pain with right-sided sciatica, Chronic midline posterior neck pain, and Type 2 diabetes mellitus with microalbuminuria, without long-term current use of insulin ([place]) were pertinent to this visit.  HPI Christine Manning presents for 2 WEEK FOLLOW UP ON chronic low back pain and neck pain with radiculopathy   MRI cervical spine was ordered but denied by [facility]  Because I am out of network   Having cough, shortness of breath and wheezing due to pollen and seasonal allergies .  Taking claritin   Still having bilateral buttock pain and right inguinal pain  Hip films were normal   Anemia first noticed in dec 2016 .  Has not been taking a therapeutic iron supplement regularly.  Up to date on colon ca screening  Outpatient Medications Prior to Visit  Medication Sig Dispense Refill  . aspirin 81 MG tablet Take 81 mg by mouth daily.    . Cyanocobalamin (VITAMIN B-12 IJ) Inject 1 each as directed every 28 (twenty-eight) days.    . ergocalciferol (DRISDOL) 50000 units capsule Take 1 capsule (50,000 Units total) by mouth once a week. 4 capsule 3  . gabapentin (NEURONTIN) 300 MG capsule Take 1 capsule (300 mg total) by mouth 3 (three) times daily as needed. 90 capsule 5  . hydrochlorothiazide (HYDRODIURIL) 25 MG tablet     . Lactobacillus-Inulin ([place]) CAPS Take 1 capsule by mouth daily. 14 capsule 0  . levothyroxine (SYNTHROID, LEVOTHROID) 150 MCG tablet Take 1 tablet (150 mcg total) by mouth daily. 90 tablet 0  . lisinopril (PRINIVIL,ZESTRIL) 40 MG tablet Take 1 tablet (40 mg total) by mouth daily. 90 tablet 1  . loratadine (CLARITIN) 10 MG tablet Take 1 tablet (10 mg total) by mouth daily. 30 tablet 0  . meloxicam (MOBIC) 7.5 MG tablet Take 1 tablet (7.5 mg total) by mouth daily. 90 tablet 1  . metoprolol  tartrate (LOPRESSOR) 25 MG tablet Take 1 tablet (25 mg total) by mouth 2 (two) times daily. 30 tablet 5  . mometasone (NASONEX) 50 MCG/ACT nasal spray Place 2 sprays into the nose daily.    . Multiple Vitamin (MULTIVITAMIN) tablet Take 1 tablet by mouth daily.    . ondansetron (ZOFRAN) 4 MG tablet Take 1 tablet (4 mg total) by mouth as needed for nausea or vomiting. 30 tablet 5  . pantoprazole (PROTONIX) 40 MG tablet Take 1 tablet (40 mg total) by mouth daily. 30 minutes Before dinner 30 tablet 3  . simvastatin (ZOCOR) 40 MG tablet Take 1 tablet (40 mg total) by mouth every evening. 90 tablet 3   No facility-administered medications prior to visit.     Review of Systems;  Patient denies headache, fevers, malaise, unintentional weight loss, skin rash, eye pain, sinus congestion and sinus pain, sore throat, dysphagia,  hemoptysis , cough, dyspnea, wheezing, chest pain, palpitations, orthopnea, edema, abdominal pain, nausea, melena, diarrhea, constipation, flank pain, dysuria, hematuria, urinary  Frequency, nocturia, numbness, tingling, seizures,  Focal weakness, Loss of consciousness,  Tremor, insomnia, depression, anxiety, and suicidal ideation.      Objective:  BP 120/80 (BP Location: Left Arm, Patient Position: Sitting, Cuff Size: Normal)   Pulse 66   Temp 98.2 F (36.8 C) (Oral)   Resp 14   Ht 4\' 10"  (1.473 m)   Wt 146 lb (66.2 kg)  SpO2 97%   BMI 30.51 kg/m   BP Readings from Last 3 Encounters:  12/26/17 120/80  12/09/17 122/84  09/19/17 136/88    Wt Readings from Last 3 Encounters:  12/26/17 146 lb (66.2 kg)  12/09/17 147 lb (66.7 kg)  09/19/17 148 lb 6.4 oz (67.3 kg)    General appearance: alert, cooperative and appears stated age Ears: normal TM's and external ear canals both ears Throat: lips, mucosa, and tongue normal; teeth and gums normal Neck: no adenopathy, no carotid bruit, supple, symmetrical, trachea midline and thyroid not enlarged, symmetric, no  tenderness/mass/nodules Back: symmetric, no curvature. ROM normal. No CVA tenderness. Lungs: clear to auscultation bilaterally Heart: regular rate and rhythm, S1, S2 normal, no murmur, click, rub or gallop Abdomen: soft, non-tender; bowel sounds normal; no masses,  no organomegaly Pulses: 2+ and symmetric Skin: Skin color, texture, turgor normal. No rashes or lesions Lymph nodes: Cervical, supraclavicular, and axillary nodes normal.  Lab Results  Component Value Date   HGBA1C 6.8 (H) 12/22/2017   HGBA1C 6.5 09/19/2017   HGBA1C 6.6 (H) 03/17/2017    Lab Results  Component Value Date   CREATININE 0.98 09/19/2017   CREATININE 0.99 03/17/2017   CREATININE 1.16 09/15/2016    Lab Results  Component Value Date   WBC 5.7 12/22/2017   HGB 10.5 (L) 12/22/2017   HCT 32.3 (L) 12/22/2017   PLT 323.0 12/22/2017   GLUCOSE 93 09/19/2017   CHOL 157 12/22/2017   TRIG 127.0 12/22/2017   HDL 53.10 12/22/2017   LDLDIRECT 165.0 09/10/2015   LDLCALC 79 12/22/2017   ALT 13 09/19/2017   AST 20 09/19/2017   NA 141 09/19/2017   K 4.1 09/19/2017   CL 105 09/19/2017   CREATININE 0.98 09/19/2017   BUN 9 09/19/2017   CO2 28 09/19/2017   TSH 0.77 12/22/2017   HGBA1C 6.8 (H) 12/22/2017   MICROALBUR 1.0 12/22/2017    No results found.  Assessment & Plan:   Problem List Items Addressed This Visit    Type 2 diabetes mellitus with microalbuminuria (HCC)    Last A1c was confirmatory of diet controlled diabetes.  I have addressed  BMI and A1c and recommended a low glycemic index diet utilizing smaller more frequent meals to increase metabolism.  I have also recommended that patient change her exercise to a goal of 30 minutes of aerobic exercise a minimum of 5 days per week.   She is already taking lisinopril.  Will not recommend asa as the risks outweigh the benefits per recent ASCEND trial    Lab Results  Component Value Date   HGBA1C 6.8 (H) 12/22/2017  . Lab Results  Component Value Date    MICROALBUR 1.0 12/22/2017          Normocytic anemia    Iron stores were borderline  and she will start  Supplementation  . Lab Results  Component Value Date   IRON 59 12/22/2017   TIBC 389 12/22/2017   FERRITIN 9 (L) 12/22/2017         Lumbago with sciatica, right side    Advised to see a covered/in network orthopedist for MRI      Relevant Medications   predniSONE (DELTASONE) 10 MG tablet   Chronic midline posterior neck pain    With radiculopathy.  Advised to find in network neurologist or orthopedist       Relevant Medications   predniSONE (DELTASONE) 10 MG tablet      I am having Christine Pons  Manning start on albuterol and predniSONE. I am also having her maintain her mometasone, aspirin, [facility], multivitamin, loratadine, Cyanocobalamin (VITAMIN B-12 IJ), simvastatin, ondansetron, metoprolol tartrate, lisinopril, pantoprazole, ergocalciferol, levothyroxine, meloxicam, gabapentin, and hydrochlorothiazide.  Meds ordered this encounter  Medications  . albuterol (PROVENTIL HFA;VENTOLIN HFA) 108 (90 Base) MCG/ACT inhaler    Sig: Inhale 2 puffs into the lungs every 6 (six) hours as needed for wheezing or shortness of breath.    Dispense:  1 Inhaler    Refill:  0  . predniSONE (DELTASONE) 10 MG tablet    Sig: 6 tablets on Day 1 , then reduce by 1 tablet daily until gone    Dispense:  21 tablet    Refill:  0    There are no discontinued medications.  Follow-up: No follow-ups on file.   Crecencio Mc, MD

## 2017-12-27 DIAGNOSIS — M5441 Lumbago with sciatica, right side: Secondary | ICD-10-CM | POA: Insufficient documentation

## 2017-12-27 DIAGNOSIS — M542 Cervicalgia: Secondary | ICD-10-CM

## 2017-12-27 DIAGNOSIS — G8929 Other chronic pain: Secondary | ICD-10-CM | POA: Insufficient documentation

## 2017-12-27 NOTE — Assessment & Plan Note (Signed)
Iron stores were borderline  and she will start  Supplementation  . Lab Results  Component Value Date   IRON 59 12/22/2017   TIBC 389 12/22/2017   FERRITIN 9 (L) 12/22/2017

## 2017-12-27 NOTE — Assessment & Plan Note (Signed)
Last A1c was confirmatory of diet controlled diabetes.  I have addressed  BMI and A1c and recommended a low glycemic index diet utilizing smaller more frequent meals to increase metabolism.  I have also recommended that patient change her exercise to a goal of 30 minutes of aerobic exercise a minimum of 5 days per week.   She is already taking lisinopril.  Will not recommend asa as the risks outweigh the benefits per recent ASCEND trial    Lab Results  Component Value Date   HGBA1C 6.8 (H) 12/22/2017  . Lab Results  Component Value Date   MICROALBUR 1.0 12/22/2017

## 2017-12-27 NOTE — Assessment & Plan Note (Signed)
With radiculopathy.  Advised to find in network neurologist or orthopedist

## 2017-12-27 NOTE — Assessment & Plan Note (Signed)
Advised to see a covered/in network orthopedist for MRI

## 2017-12-29 ENCOUNTER — Ambulatory Visit: Payer: Medicare Other

## 2018-01-10 ENCOUNTER — Ambulatory Visit: Payer: Medicare HMO

## 2018-01-18 ENCOUNTER — Ambulatory Visit (INDEPENDENT_AMBULATORY_CARE_PROVIDER_SITE_OTHER): Payer: Medicare HMO | Admitting: *Deleted

## 2018-01-18 DIAGNOSIS — E538 Deficiency of other specified B group vitamins: Secondary | ICD-10-CM | POA: Diagnosis not present

## 2018-01-18 MED ORDER — CYANOCOBALAMIN 1000 MCG/ML IJ SOLN
1000.0000 ug | Freq: Once | INTRAMUSCULAR | Status: AC
  Administered 2018-01-18: 1000 ug via INTRAMUSCULAR

## 2018-01-18 NOTE — Progress Notes (Signed)
Patient presented for B 12 injection to right deltoid, patient voiced no concerns nor showed any signs of distress during injection. 

## 2018-05-23 DIAGNOSIS — Z923 Personal history of irradiation: Secondary | ICD-10-CM | POA: Insufficient documentation

## 2018-08-18 IMAGING — DX DG HIP (WITH OR WITHOUT PELVIS) 3-4V BILAT
5 series · 5 of 5 positions shown · non-contrast
Comparison: None.

CLINICAL DATA: Bilateral hip pain for the past 6 months.

EXAM:
DG HIP (WITH OR WITHOUT PELVIS) 3-4V BILAT

[pelvis ap]
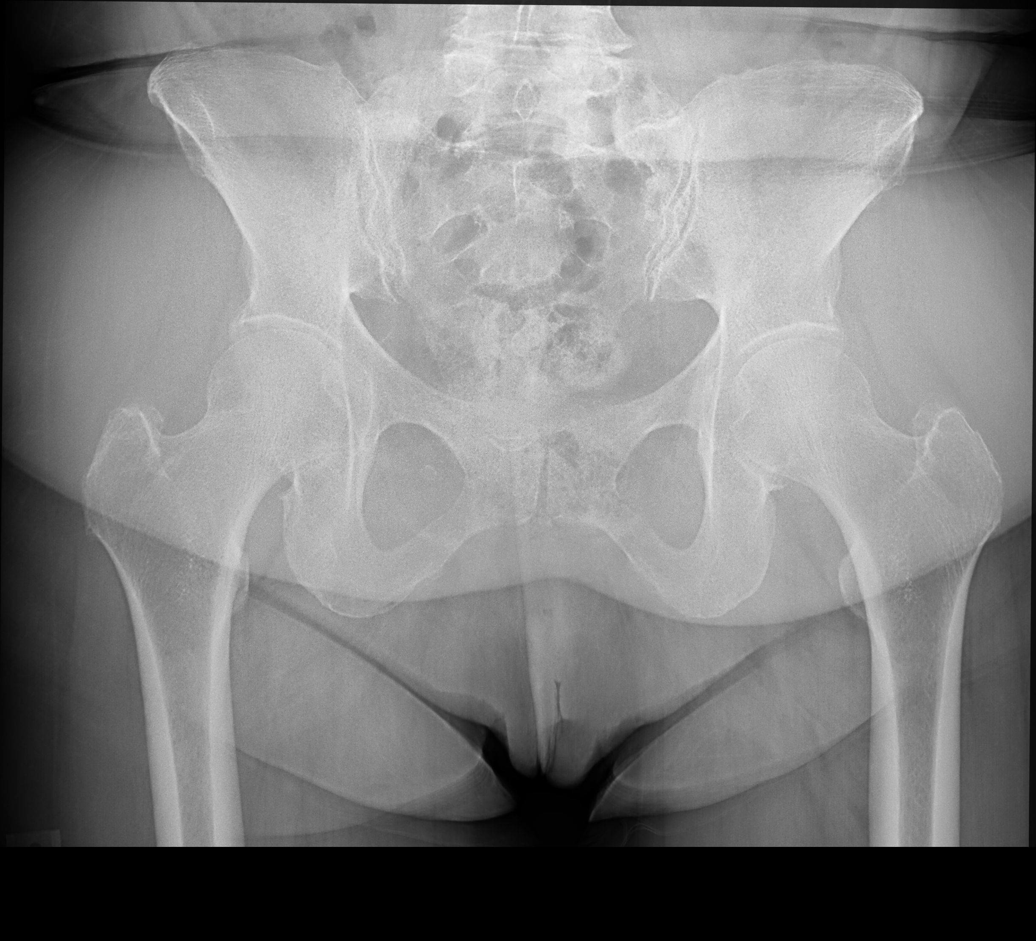

[hip joint ap (1 of 2)]
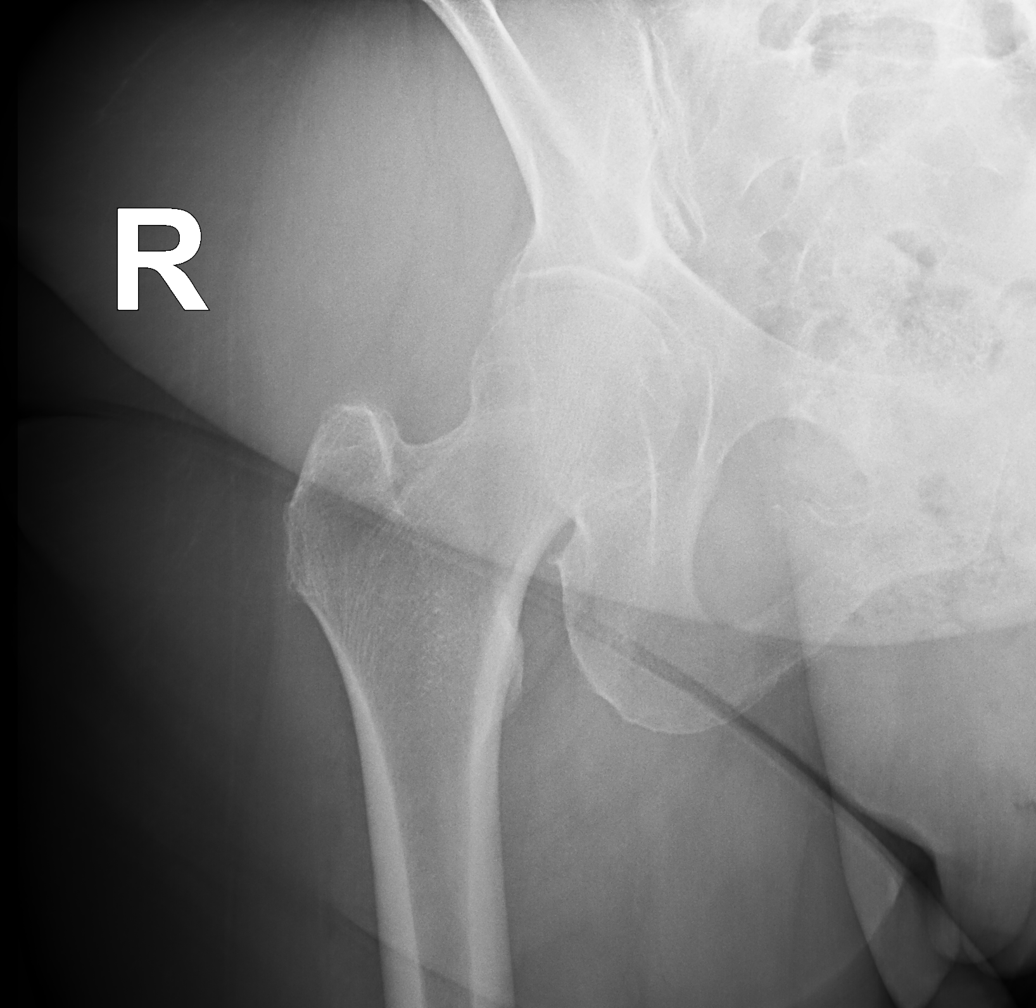

[ap frog obl (oblique) (1 of 2)]
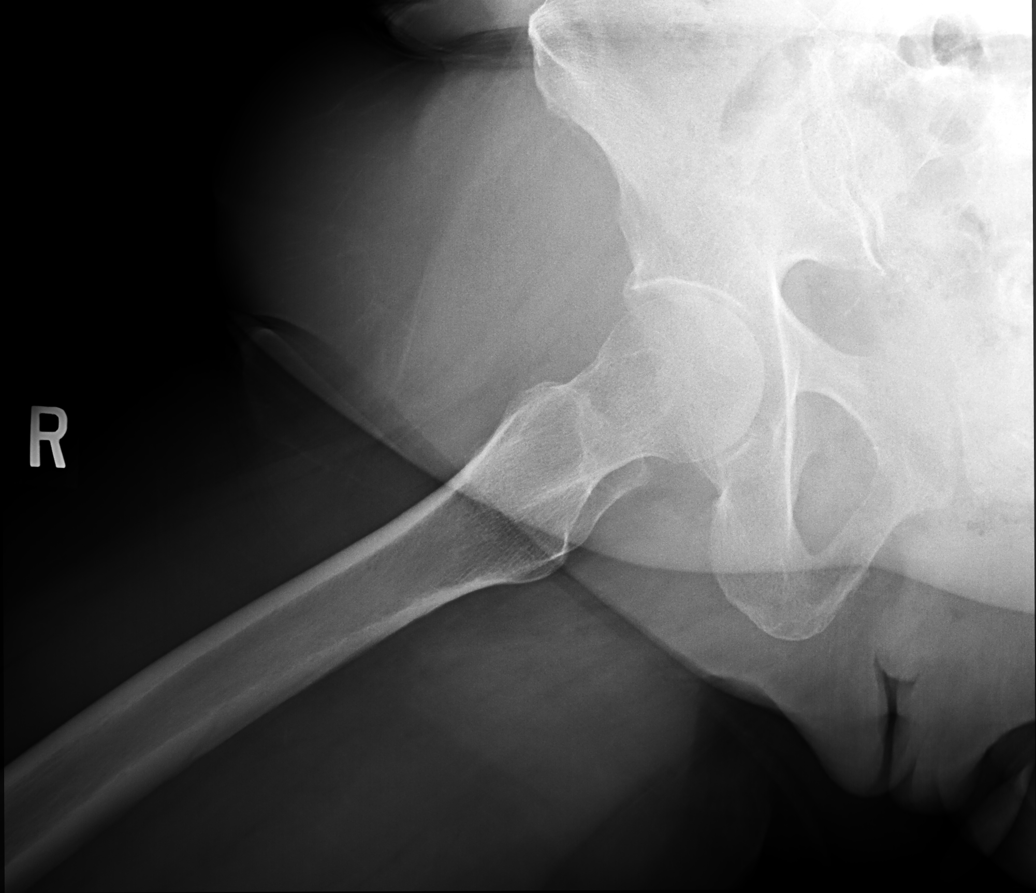

[hip joint ap (2 of 2)]
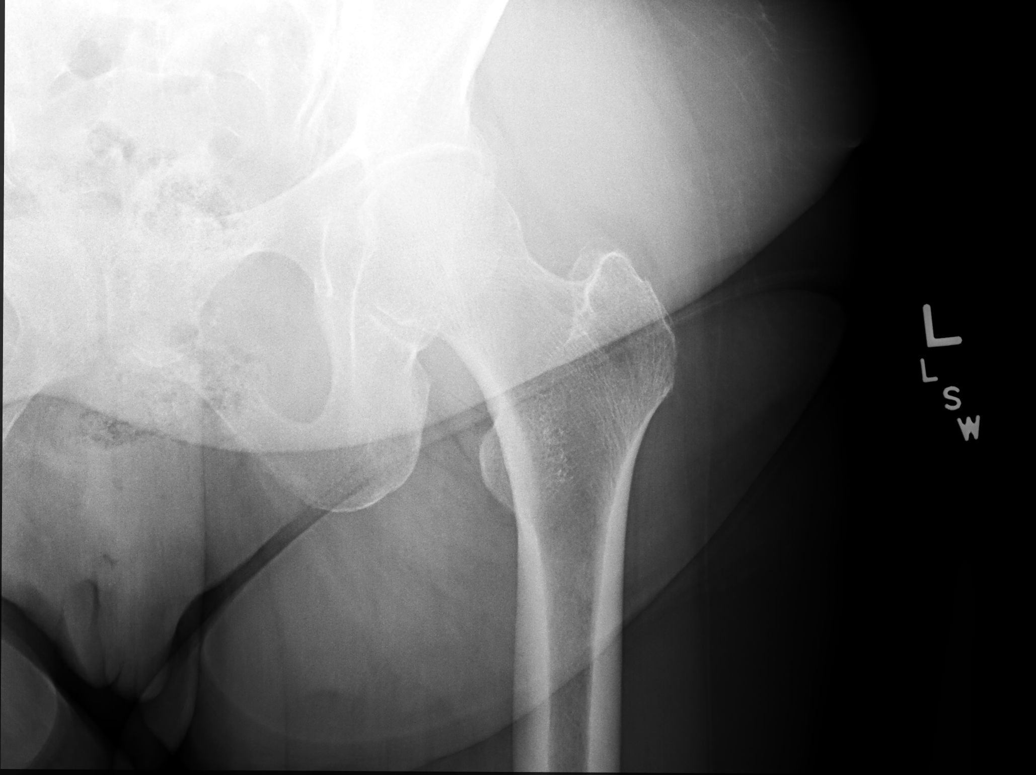

[ap frog obl (oblique) (2 of 2)]
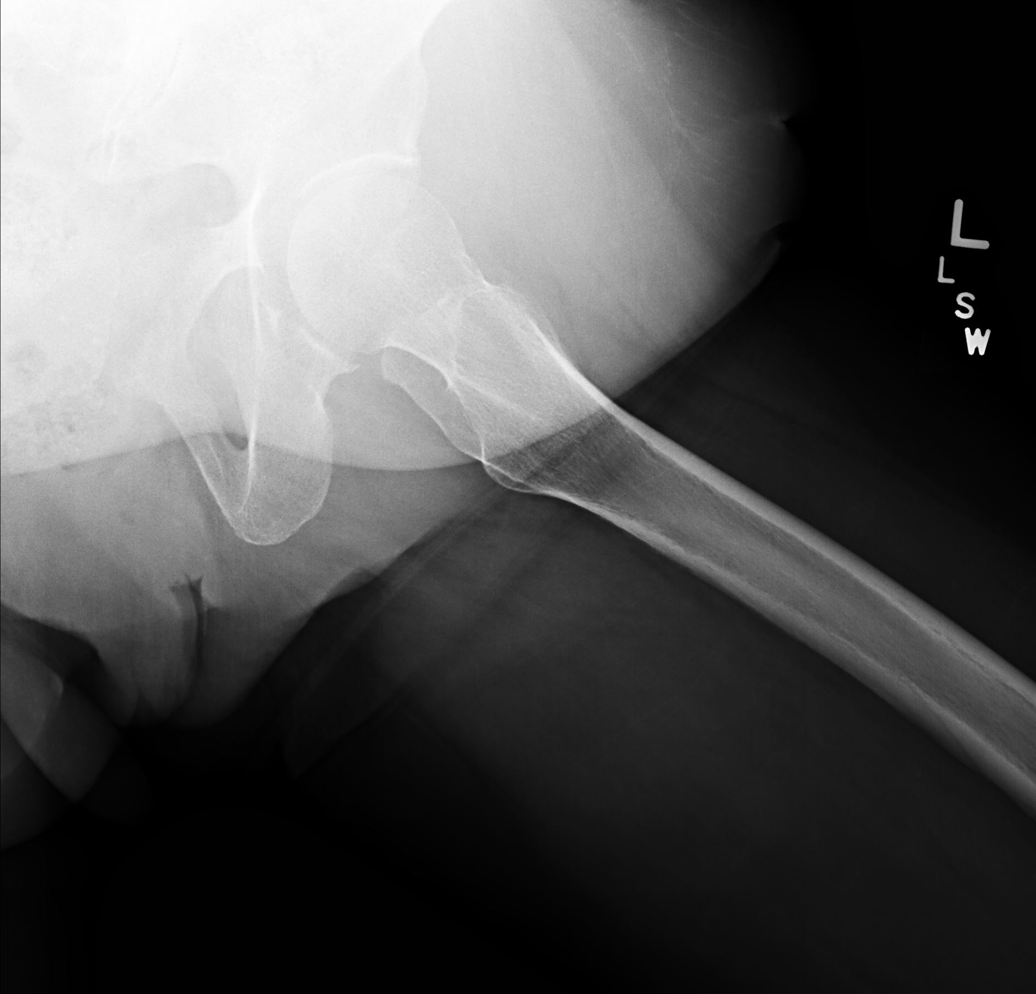

[5 of 5 positions shown; findings below may reference images not displayed]

FINDINGS: Normal appearing hips.  Lower lumbar spine degenerative changes.
IMPRESSION: Normal appearing hips.  Lower lumbar spine degenerative changes.

## 2019-02-01 HISTORY — PX: THYROIDECTOMY, PARTIAL: SHX18

## 2019-02-16 DIAGNOSIS — E890 Postprocedural hypothyroidism: Secondary | ICD-10-CM | POA: Insufficient documentation

## 2019-03-12 DIAGNOSIS — M810 Age-related osteoporosis without current pathological fracture: Secondary | ICD-10-CM | POA: Insufficient documentation

## 2019-03-12 DIAGNOSIS — D6859 Other primary thrombophilia: Secondary | ICD-10-CM | POA: Insufficient documentation

## 2019-03-12 DIAGNOSIS — Z6829 Body mass index (BMI) 29.0-29.9, adult: Secondary | ICD-10-CM | POA: Insufficient documentation

## 2019-03-12 DIAGNOSIS — L600 Ingrowing nail: Secondary | ICD-10-CM | POA: Insufficient documentation

## 2019-03-12 DIAGNOSIS — Z7189 Other specified counseling: Secondary | ICD-10-CM | POA: Insufficient documentation

## 2019-03-12 DIAGNOSIS — N393 Stress incontinence (female) (male): Secondary | ICD-10-CM | POA: Insufficient documentation

## 2019-05-03 ENCOUNTER — Ambulatory Visit: Payer: Medicare HMO

## 2019-07-08 DIAGNOSIS — Z008 Encounter for other general examination: Secondary | ICD-10-CM | POA: Insufficient documentation

## 2019-11-29 HISTORY — PX: COLONOSCOPY: SHX174

## 2020-01-24 LAB — HM MAMMOGRAPHY

## 2020-02-06 ENCOUNTER — Telehealth: Payer: Self-pay | Admitting: Internal Medicine

## 2020-02-06 NOTE — Telephone Encounter (Signed)
-----   Message from Salvatore Marvel sent at 02/04/2020  4:28 PM EDT ----- Regarding: remove pcp Pt has moved to [facility]  Thank you

## 2020-02-06 NOTE — Telephone Encounter (Signed)
PCP removed patient moved to [facility]

## 2020-11-19 DIAGNOSIS — R3129 Other microscopic hematuria: Secondary | ICD-10-CM | POA: Insufficient documentation

## 2021-03-16 DIAGNOSIS — Z7901 Long term (current) use of anticoagulants: Secondary | ICD-10-CM | POA: Insufficient documentation

## 2021-03-16 DIAGNOSIS — R918 Other nonspecific abnormal finding of lung field: Secondary | ICD-10-CM | POA: Insufficient documentation

## 2021-03-16 DIAGNOSIS — J4520 Mild intermittent asthma, uncomplicated: Secondary | ICD-10-CM | POA: Insufficient documentation

## 2021-06-11 DIAGNOSIS — M1712 Unilateral primary osteoarthritis, left knee: Secondary | ICD-10-CM | POA: Insufficient documentation

## 2021-07-08 HISTORY — PX: TOTAL KNEE ARTHROPLASTY: SHX125

## 2021-09-20 DIAGNOSIS — Z86718 Personal history of other venous thrombosis and embolism: Secondary | ICD-10-CM

## 2021-09-20 HISTORY — DX: Personal history of other venous thrombosis and embolism: Z86.718

## 2021-10-29 DIAGNOSIS — D135 Benign neoplasm of extrahepatic bile ducts: Secondary | ICD-10-CM | POA: Insufficient documentation

## 2021-11-06 ENCOUNTER — Other Ambulatory Visit: Payer: Self-pay | Admitting: Gastroenterology

## 2022-01-28 DIAGNOSIS — R1011 Right upper quadrant pain: Secondary | ICD-10-CM | POA: Insufficient documentation

## 2022-04-01 DIAGNOSIS — R0602 Shortness of breath: Secondary | ICD-10-CM | POA: Insufficient documentation

## 2022-04-01 DIAGNOSIS — R002 Palpitations: Secondary | ICD-10-CM | POA: Insufficient documentation

## 2022-11-11 DIAGNOSIS — G8929 Other chronic pain: Secondary | ICD-10-CM | POA: Insufficient documentation

## 2023-03-16 DIAGNOSIS — E611 Iron deficiency: Secondary | ICD-10-CM | POA: Insufficient documentation

## 2023-06-07 LAB — HEMOGLOBIN A1C: Hemoglobin A1C: 6.1

## 2023-06-09 DIAGNOSIS — Z8619 Personal history of other infectious and parasitic diseases: Secondary | ICD-10-CM | POA: Insufficient documentation

## 2023-08-23 ENCOUNTER — Telehealth: Payer: Self-pay

## 2023-08-23 NOTE — Telephone Encounter (Signed)
Patient states she was a patient of Dr. Rosey Bath Tullo's before she was married and moved away.  Patient states she would like to see if Dr. Darrick Huntsman would be willing to accept her back as her patient.  I let patient know that Dr. Darrick Huntsman is not currently accepting new patients, but I will send her a note to find out.

## 2023-08-24 NOTE — Telephone Encounter (Signed)
Spoke with pt to let her know that Dr. Darrick Huntsman is not accepting new pts at this time. Pt stated that she would like to establish with Dr. Clent Ridges then.

## 2023-11-10 ENCOUNTER — Telehealth: Payer: Self-pay | Admitting: Family Medicine

## 2023-11-10 ENCOUNTER — Ambulatory Visit (INDEPENDENT_AMBULATORY_CARE_PROVIDER_SITE_OTHER): Payer: Medicare HMO | Admitting: Family Medicine

## 2023-11-10 ENCOUNTER — Encounter: Payer: Self-pay | Admitting: Family Medicine

## 2023-11-10 VITALS — BP 128/70 | HR 57 | Temp 98.2°F | Resp 18 | Ht <= 58 in | Wt 121.0 lb

## 2023-11-10 DIAGNOSIS — Z780 Asymptomatic menopausal state: Secondary | ICD-10-CM | POA: Diagnosis not present

## 2023-11-10 DIAGNOSIS — E785 Hyperlipidemia, unspecified: Secondary | ICD-10-CM

## 2023-11-10 DIAGNOSIS — M1712 Unilateral primary osteoarthritis, left knee: Secondary | ICD-10-CM

## 2023-11-10 DIAGNOSIS — G8929 Other chronic pain: Secondary | ICD-10-CM

## 2023-11-10 DIAGNOSIS — K580 Irritable bowel syndrome with diarrhea: Secondary | ICD-10-CM

## 2023-11-10 DIAGNOSIS — I87009 Postthrombotic syndrome without complications of unspecified extremity: Secondary | ICD-10-CM

## 2023-11-10 DIAGNOSIS — Z1159 Encounter for screening for other viral diseases: Secondary | ICD-10-CM | POA: Diagnosis not present

## 2023-11-10 DIAGNOSIS — E118 Type 2 diabetes mellitus with unspecified complications: Secondary | ICD-10-CM

## 2023-11-10 DIAGNOSIS — D509 Iron deficiency anemia, unspecified: Secondary | ICD-10-CM

## 2023-11-10 DIAGNOSIS — E890 Postprocedural hypothyroidism: Secondary | ICD-10-CM

## 2023-11-10 DIAGNOSIS — E538 Deficiency of other specified B group vitamins: Secondary | ICD-10-CM | POA: Diagnosis not present

## 2023-11-10 DIAGNOSIS — Z1231 Encounter for screening mammogram for malignant neoplasm of breast: Secondary | ICD-10-CM

## 2023-11-10 DIAGNOSIS — I152 Hypertension secondary to endocrine disorders: Secondary | ICD-10-CM | POA: Diagnosis not present

## 2023-11-10 DIAGNOSIS — Z7984 Long term (current) use of oral hypoglycemic drugs: Secondary | ICD-10-CM

## 2023-11-10 DIAGNOSIS — E1169 Type 2 diabetes mellitus with other specified complication: Secondary | ICD-10-CM | POA: Diagnosis not present

## 2023-11-10 DIAGNOSIS — R944 Abnormal results of kidney function studies: Secondary | ICD-10-CM

## 2023-11-10 DIAGNOSIS — R1011 Right upper quadrant pain: Secondary | ICD-10-CM | POA: Diagnosis not present

## 2023-11-10 DIAGNOSIS — E559 Vitamin D deficiency, unspecified: Secondary | ICD-10-CM | POA: Diagnosis not present

## 2023-11-10 DIAGNOSIS — D6869 Other thrombophilia: Secondary | ICD-10-CM | POA: Diagnosis not present

## 2023-11-10 DIAGNOSIS — D135 Benign neoplasm of extrahepatic bile ducts: Secondary | ICD-10-CM

## 2023-11-10 DIAGNOSIS — E7800 Pure hypercholesterolemia, unspecified: Secondary | ICD-10-CM

## 2023-11-10 DIAGNOSIS — E1159 Type 2 diabetes mellitus with other circulatory complications: Secondary | ICD-10-CM | POA: Diagnosis not present

## 2023-11-10 DIAGNOSIS — J4520 Mild intermittent asthma, uncomplicated: Secondary | ICD-10-CM

## 2023-11-10 DIAGNOSIS — M5441 Lumbago with sciatica, right side: Secondary | ICD-10-CM

## 2023-11-10 DIAGNOSIS — M810 Age-related osteoporosis without current pathological fracture: Secondary | ICD-10-CM

## 2023-11-10 NOTE — Progress Notes (Unsigned)
 SUBJECTIVE:   Chief Complaint  Patient presents with   Establish Care   HPI Presents to establish care  Discussed the use of AI scribe software for clinical note transcription with the patient, who gave verbal consent to proceed.  History of Present Illness Christine Manning is a 78 year old female who presents to establish care.  She has a history of asthma, managed with albuterol as needed and a nebulizer for exacerbations. She has not been hospitalized for asthma since retiring from [employer], where she experienced frequent exacerbations. She has not seen a pulmonologist recently. No history of smoking.  She has type 2 diabetes managed with metformin 500 mg. Since December 2024, her blood sugar levels have been lower than usual, with readings as low as 70, typically maintaining between 90 and 105. She has experienced recent weight loss, which may be affecting her blood sugar levels. She experiences occasional numbness and tingling, possibly related to diabetic neuropathy, and has been on gabapentin for two years.  Her hypertension is managed with lisinopril 20 mg and hydrochlorothiazide 12.5 mg. She also takes loratadine for allergies, magnesium 200 mg, and vitamin D 1000 units. She reports having IBS with diarrhea and low magnesium levels.  She is on atorvastatin 80 mg for hyperlipidemia, initially prescribed due to a thyroid problem many years ago. She has been on this dose since then. No history of stroke.  She reports having osteoporosis, affecting both shoulders and her right knee. She has had a left knee replacement and anticipates needing one for the right knee. She has been on various osteoporosis medications in the past, including Fosamax and a self-administered pen medication. Last bone density scan due in 2026.  She is on levothyroxine 75 mcg, adjusted in June 2024. Her TSH levels have fluctuated, and she currently takes the medication six days a week. She has experienced  weight loss, dropping to 115 pounds but has since gained back to 121 pounds. She reports having a thyroid growth removed and radiation treatment in the past. She was scheduled to see an endocrinologist but has not yet done so.  She takes gabapentin 300 mg for arthritis-related pain. She reports having a previous rotator cuff injury and shoulder pain affecting sleep.  She is on Xarelto due to a history of blood clots, the most recent occurring two years ago. She has a history of blood clots during pregnancy and has seen a hematologist for evaluation.  She experiences bloating and changes in bowel habits, with incomplete bowel movements and a frequency of every other day. She primarily eats vegetables and very little meat. She has been diagnosed with IBS and reports that certain foods can trigger symptoms. She reports having uncomfortable chest sensations and was supposed to see a cardiologist and a vascular specialist but missed appointments due to insurance changes. She has a history of a heart murmur and has had an echocardiogram in the past.   She is due for a mammogram and bone density scan. She has a history of B12 deficiency managed with injections.   She is requesting referral to vascular surgery, general surgery, GI,    PERTINENT PMH / PSH: As above  OBJECTIVE:  BP 128/70   Pulse (!) 57   Temp 98.2 F (36.8 C)   Resp 18   Ht 4\' 10"  (1.473 m)   Wt 121 lb (54.9 kg)   SpO2 97%   BMI 25.29 kg/m    Physical Exam Vitals reviewed.  Constitutional:  General: She is not in acute distress.    Appearance: She is not ill-appearing.  HENT:     Head: Normocephalic.     Right Ear: Tympanic membrane, ear canal and external ear normal.     Left Ear: Tympanic membrane, ear canal and external ear normal.     Nose: Nose normal.     Mouth/Throat:     Mouth: Mucous membranes are moist.  Eyes:     Extraocular Movements: Extraocular movements intact.     Conjunctiva/sclera: Conjunctivae  normal.     Pupils: Pupils are equal, round, and reactive to light.  Neck:     Thyroid: No thyromegaly or thyroid tenderness.     Vascular: No carotid bruit.  Cardiovascular:     Rate and Rhythm: Regular rhythm. Bradycardia present.     Pulses: Normal pulses.     Heart sounds: Normal heart sounds.  Pulmonary:     Effort: Pulmonary effort is normal.     Breath sounds: Normal breath sounds.  Abdominal:     General: Bowel sounds are normal. There is no distension.     Palpations: Abdomen is soft.     Tenderness: There is abdominal tenderness in the right upper quadrant. There is no right CVA tenderness, left CVA tenderness, guarding or rebound. Negative signs include Murphy's sign.  Musculoskeletal:        General: Normal range of motion.     Cervical back: Normal range of motion.     Right lower leg: No edema.     Left lower leg: No edema.  Lymphadenopathy:     Cervical: No cervical adenopathy.  Skin:    Capillary Refill: Capillary refill takes less than 2 seconds.  Neurological:     General: No focal deficit present.     Mental Status: She is alert and oriented to person, place, and time. Mental status is at baseline.     Motor: No weakness.  Psychiatric:        Mood and Affect: Mood normal.        Behavior: Behavior normal.        Thought Content: Thought content normal.        Judgment: Judgment normal.           11/10/2023    2:22 PM 12/28/2016   10:36 AM 08/24/2016   11:08 AM 09/10/2015   10:07 AM 09/10/2015    8:33 AM  Depression screen PHQ 2/9  Decreased Interest 0 0 0 0 0  Down, Depressed, Hopeless 0 0 0 1 1  PHQ - 2 Score 0 0 0 1 1  Altered sleeping 1      Tired, decreased energy 1      Change in appetite 0      Feeling bad or failure about yourself  0      Trouble concentrating 0      Moving slowly or fidgety/restless 0      Suicidal thoughts 0      PHQ-9 Score 2      Difficult doing work/chores Not difficult at all          11/10/2023    2:22 PM   GAD 7 : Generalized Anxiety Score  Nervous, Anxious, on Edge 0  Control/stop worrying 0  Worry too much - different things 0  Trouble relaxing 0  Restless 0  Easily annoyed or irritable 0  Afraid - awful might happen 0  Total GAD 7 Score 0  Anxiety Difficulty Not difficult  at all    ASSESSMENT/PLAN:  Type 2 diabetes mellitus with complications (HCC) Assessment & Plan: Managed with Metformin  -Check A1c -Continue Metformin 500 mg daily -Foot Exam at next visit -Annual eye exam   Orders: -     Hemoglobin A1c -     Microalbumin / creatinine urine ratio  Hyperlipidemia associated with type 2 diabetes mellitus (HCC) Assessment & Plan: On Atorvastatin 80mg  since a thyroid-related issue 20 years ago. Recent weight loss. -Check lipid panel. Consider adjusting Atorvastatin dose based on results.  Orders: -     Lipid panel  Hypertension associated with diabetes (HCC) Assessment & Plan: Managed with Lisinopril 20mg  and Hydrochlorothiazide 12.5mg . -Continue current treatment. -Check Cmet  Orders: -     Comprehensive metabolic panel  Hypercoagulable state, secondary (HCC) Assessment & Plan: History of DVT s/p knee surgery.  Developed second DVT later in life.  Reports having been evaluated by oncology.  Recommended to continue anticoagulation.   -Previously followed by Vascular at [facility] -Continue Xarelto -Referral to Vascular to establish care at patients request.    Orders: -     Ambulatory referral to Vascular Surgery  Need for hepatitis C screening test -     Hepatitis C antibody  Breast cancer screening by mammogram -     3D Screening Mammogram, Left and Right  Postmenopausal estrogen deficiency -     DG Bone Density  Vitamin B 12 deficiency Assessment & Plan: Check B12 levels.   Orders: -     Vitamin B12  Vitamin D deficiency -     VITAMIN D 25 Hydroxy (Vit-D Deficiency, Fractures) -     Vitamin D (Ergocalciferol); Take 1 capsule (50,000 Units total) by  mouth every 7 (seven) days.  Dispense: 12 capsule; Refill: 1  Iron deficiency anemia, unspecified iron deficiency anemia type -     CBC with Differential/Platelet -     IBC + Ferritin  Postoperative hypothyroidism Assessment & Plan: On Levothyroxine since June. Previous doses ranged from 50-121mcg. Last TSH check in September. -Check TSH and adjust Levothyroxine dose as needed.  Orders: -     TSH -     TSH; Future -     Levothyroxine Sodium; Take 1 tablet (50 mcg total) by mouth daily.  Dispense: 90 tablet; Refill: 3  Abdominal pain, RUQ Assessment & Plan: Recent discomfort in the abdominal region.Reports bloating and changes in bowel movements.  Was referred to surgery in [place] but due to recent move requesting referral sent here. RUQ tenderness on abdominal exam today.   -Order RUQ ultrasound  -Check labs -Referral to surgery pending results  Orders: -     US ABDOMEN LIMITED RUQ (LIVER/GB) -     Lipase  Mild intermittent asthma, unspecified whether complicated Assessment & Plan: Controlled with Albuterol inhaler and nebulizer. No recent pulmonary function tests. No history of smoking. -Continue current treatment.   Osteoporosis without current pathological fracture, unspecified osteoporosis type Assessment & Plan: Previously treated in 1998 with Alendronate initially then Boniva. Had also tried Forteo for 1 year. Last DEXA 2018 showing T score of -2.0.  Currently on Vitamin D supplements -Continue Vitamin D weekly -Repeat Dexa   Primary osteoarthritis of left knee Assessment & Plan: Chronic pain managed with Gabapentin 300mg . History of left knee replacement, right knee replacement anticipated.  -Continue Gabapentin.   Postthrombotic syndrome Assessment & Plan: History of recurrent DVT and postthrombotic syndrome Followed by Vascular in [facility] and currently taking Xarelto 10 mg daily.  Recommended daily compression stocking use. Requesting referral to  Vascular referral in [place]  Orders: -     Ambulatory referral to Vascular Surgery  Chronic bilateral low back pain with right-sided sciatica Assessment & Plan: On Gabapentin for 2 years -Continue Gabapentin 300 mg at night. -Follows with pain management   Irritable bowel syndrome with diarrhea Assessment & Plan: Reports change in bowel habits. Requesting GI referral for follow up.  Was following with GI in [place]. -Referral to GI  Orders: -     Ambulatory referral to Gastroenterology  Adenomyomatosis of gallbladder Assessment & Plan: Previous CT A/P 12/22  completed by former PCP in [place] showed moderate colonic stool burden and changes consistent with chronic adenomyomatosis of the gallbladder. She is requesting surgical referral.   Decreased GFR -     Comprehensive metabolic panel; Future   PDMP reviewed  Return in about 2 weeks (around 11/24/2023) for PCP, DM.  Dana Allan, MD

## 2023-11-10 NOTE — Patient Instructions (Signed)
 It was a pleasure meeting you today. Thank you for allowing me to take part in your health care.  Our goals for today as we discussed include:  We will get some labs today.  If they are abnormal or we need to do something about them, I will call you.  If they are normal, I will send you a message on MyChart (if it is active) or a letter in the mail.  If you don't hear from [place] in 2 weeks, please call the office at the number below.   Ultrasound ordered for your abdominal pain  Schedule follow up appointment in 2 weeks   This is a list of the screening recommended for you and due dates:  Health Maintenance  Topic Date Due   Eye exam for diabetics  05/20/2018   Yearly kidney function blood test for diabetes  09/19/2018   Complete foot exam   09/19/2018   Yearly kidney health urinalysis for diabetes  12/23/2018   Mammogram  01/23/2021   COVID-19 Vaccine (8 - 2024-25 season) 11/03/2023   Hemoglobin A1C  12/05/2023   Medicare Annual Wellness Visit  03/13/2024   DTaP/Tdap/Td vaccine (2 - Td or Tdap) 06/18/2024   Colon Cancer Screening  11/28/2024   Pneumonia Vaccine  Completed   Flu Shot  Completed   DEXA scan (bone density measurement)  Completed   Hepatitis C Screening  Completed   Zoster (Shingles) Vaccine  Completed   HPV Vaccine  Aged Out      If you have any questions or concerns, please do not hesitate to call the office at [phone].  I look forward to our next visit and until then take care and stay safe.  Regards,   Dana Allan, MD   [facility]

## 2023-11-10 NOTE — Telephone Encounter (Signed)
 Lft pt vm to call ofc to sch [place]. thanks ?

## 2023-11-11 ENCOUNTER — Telehealth: Payer: Self-pay | Admitting: Family Medicine

## 2023-11-11 LAB — COMPREHENSIVE METABOLIC PANEL
ALT: 8 U/L (ref 0–35)
AST: 16 U/L (ref 0–37)
Albumin: 4.3 g/dL (ref 3.5–5.2)
Alkaline Phosphatase: 46 U/L (ref 39–117)
BUN: 18 mg/dL (ref 6–23)
CO2: 28 meq/L (ref 19–32)
Calcium: 9.1 mg/dL (ref 8.4–10.5)
Chloride: 103 meq/L (ref 96–112)
Creatinine, Ser: 1.22 mg/dL — ABNORMAL HIGH (ref 0.40–1.20)
GFR: 43.21 mL/min — ABNORMAL LOW (ref >60.00)
Glucose, Bld: 78 mg/dL (ref 70–99)
Potassium: 4.1 meq/L (ref 3.5–5.1)
Sodium: 141 meq/L (ref 135–145)
Total Bilirubin: 0.6 mg/dL (ref 0.2–1.2)
Total Protein: 7.5 g/dL (ref 6.0–8.3)

## 2023-11-11 LAB — LIPASE: Lipase: 55 U/L (ref 11.0–59.0)

## 2023-11-11 LAB — CBC WITH DIFFERENTIAL/PLATELET
Basophils Absolute: 0.1 10*3/uL (ref 0.0–0.1)
Basophils Relative: 1.2 % (ref 0.0–3.0)
Eosinophils Absolute: 0.1 10*3/uL (ref 0.0–0.7)
Eosinophils Relative: 1.3 % (ref 0.0–5.0)
HCT: 35.9 % — ABNORMAL LOW (ref 36.0–46.0)
Hemoglobin: 12 g/dL (ref 12.0–15.0)
Lymphocytes Relative: 35.8 % (ref 12.0–46.0)
Lymphs Abs: 1.8 10*3/uL (ref 0.7–4.0)
MCHC: 33.6 g/dL (ref 30.0–36.0)
MCV: 95.4 fL (ref 78.0–100.0)
Monocytes Absolute: 0.3 10*3/uL (ref 0.1–1.0)
Monocytes Relative: 6.2 % (ref 3.0–12.0)
Neutro Abs: 2.7 10*3/uL (ref 1.4–7.7)
Neutrophils Relative %: 55.5 % (ref 43.0–77.0)
Platelets: 292 10*3/uL (ref 150.0–400.0)
RBC: 3.76 Mil/uL — ABNORMAL LOW (ref 3.87–5.11)
RDW: 13.9 % (ref 11.5–15.5)
WBC: 4.9 10*3/uL (ref 4.0–10.5)

## 2023-11-11 LAB — HEMOGLOBIN A1C: Hgb A1c MFr Bld: 6.3 % (ref 4.6–6.5)

## 2023-11-11 LAB — IBC + FERRITIN
Ferritin: 48.8 ng/mL (ref 10.0–291.0)
Iron: 71 ug/dL (ref 42–145)
Saturation Ratios: 21.7 % (ref 20.0–50.0)
TIBC: 327.6 ug/dL (ref 250.0–450.0)
Transferrin: 234 mg/dL (ref 212.0–360.0)

## 2023-11-11 LAB — TSH: TSH: 0.01 u[IU]/mL — ABNORMAL LOW (ref 0.35–5.50)

## 2023-11-11 LAB — VITAMIN B12: Vitamin B-12: 410 pg/mL (ref 211–911)

## 2023-11-11 LAB — LIPID PANEL
Cholesterol: 205 mg/dL — ABNORMAL HIGH (ref 0–200)
HDL: 62.5 mg/dL (ref >39.00)
LDL Cholesterol: 114 mg/dL — ABNORMAL HIGH (ref 0–99)
NonHDL: 142.15
Total CHOL/HDL Ratio: 3
Triglycerides: 139 mg/dL (ref 0.0–149.0)
VLDL: 27.8 mg/dL (ref 0.0–40.0)

## 2023-11-11 LAB — VITAMIN D 25 HYDROXY (VIT D DEFICIENCY, FRACTURES): VITD: 28.75 ng/mL — ABNORMAL LOW (ref 30.00–100.00)

## 2023-11-11 LAB — HEPATITIS C ANTIBODY: Hepatitis C Ab: NONREACTIVE

## 2023-11-11 LAB — MICROALBUMIN / CREATININE URINE RATIO
Creatinine,U: 173 mg/dL
Microalb Creat Ratio: 5.7 mg/g (ref 0.0–30.0)
Microalb, Ur: 1 mg/dL (ref 0.0–1.9)

## 2023-11-11 NOTE — Telephone Encounter (Signed)
 Lft pt vm to call ofc to sch [place]. thanks ?

## 2023-11-16 ENCOUNTER — Ambulatory Visit: Payer: Medicare HMO

## 2023-11-16 DIAGNOSIS — I87009 Postthrombotic syndrome without complications of unspecified extremity: Secondary | ICD-10-CM | POA: Insufficient documentation

## 2023-11-16 DIAGNOSIS — Z1159 Encounter for screening for other viral diseases: Secondary | ICD-10-CM | POA: Insufficient documentation

## 2023-11-16 DIAGNOSIS — E538 Deficiency of other specified B group vitamins: Secondary | ICD-10-CM | POA: Insufficient documentation

## 2023-11-16 DIAGNOSIS — D6869 Other thrombophilia: Secondary | ICD-10-CM | POA: Insufficient documentation

## 2023-11-16 DIAGNOSIS — E118 Type 2 diabetes mellitus with unspecified complications: Secondary | ICD-10-CM | POA: Insufficient documentation

## 2023-11-16 DIAGNOSIS — R1011 Right upper quadrant pain: Secondary | ICD-10-CM | POA: Insufficient documentation

## 2023-11-16 DIAGNOSIS — D509 Iron deficiency anemia, unspecified: Secondary | ICD-10-CM | POA: Insufficient documentation

## 2023-11-16 DIAGNOSIS — Z1231 Encounter for screening mammogram for malignant neoplasm of breast: Secondary | ICD-10-CM | POA: Insufficient documentation

## 2023-11-16 NOTE — Assessment & Plan Note (Signed)
 Managed with Lisinopril 20mg  and Hydrochlorothiazide 12.5mg . -Continue current treatment. -Check Cmet

## 2023-11-16 NOTE — Assessment & Plan Note (Signed)
 Managed with Metformin  -Check A1c -Continue Metformin 500 mg daily -Foot Exam at next visit -Annual eye exam

## 2023-11-16 NOTE — Assessment & Plan Note (Signed)
Check B12 levels 

## 2023-11-16 NOTE — Assessment & Plan Note (Addendum)
 Controlled with Albuterol inhaler and nebulizer. No recent pulmonary function tests. No history of smoking. -Continue current treatment.

## 2023-11-16 NOTE — Assessment & Plan Note (Signed)
 On Levothyroxine since June. Previous doses ranged from 50-113mcg. Last TSH check in September. -Check TSH and adjust Levothyroxine dose as needed.

## 2023-11-16 NOTE — Assessment & Plan Note (Signed)
 On Gabapentin for 2 years -Continue Gabapentin 300 mg at night. -Follows with pain management

## 2023-11-16 NOTE — Assessment & Plan Note (Deleted)
 On Atorvastatin 80mg  since a thyroid-related issue 20 years ago. Recent weight loss. -Check lipid panel. Consider adjusting Atorvastatin dose based on results.

## 2023-11-16 NOTE — Assessment & Plan Note (Signed)
 History of recurrent DVT and postthrombotic syndrome Followed by Vascular in [facility] and currently taking Xarelto 10 mg daily.   Recommended daily compression stocking use. Requesting referral to Vascular referral in [facility]

## 2023-11-16 NOTE — Assessment & Plan Note (Signed)
 On Atorvastatin 80mg  since a thyroid-related issue 20 years ago. Recent weight loss. -Check lipid panel. Consider adjusting Atorvastatin dose based on results.

## 2023-11-16 NOTE — Assessment & Plan Note (Signed)
 Chronic pain managed with Gabapentin 300mg . History of left knee replacement, right knee replacement anticipated.  -Continue Gabapentin.

## 2023-11-16 NOTE — Assessment & Plan Note (Signed)
 Recent discomfort in the abdominal region.Reports bloating and changes in bowel movements.  Was referred to surgery in [place] but due to recent move requesting referral sent here. RUQ tenderness on abdominal exam today.   -Order RUQ ultrasound  -Check labs -Referral to surgery pending results

## 2023-11-16 NOTE — Assessment & Plan Note (Signed)
 Previously treated in 1998 with Alendronate initially then Boniva. Had also tried Forteo for 1 year. Last DEXA 2018 showing T score of -2.0.  Currently on Vitamin D supplements -Continue Vitamin D weekly -Repeat Dexa

## 2023-11-16 NOTE — Assessment & Plan Note (Signed)
 History of DVT s/p knee surgery.  Developed second DVT later in life.  Reports having been evaluated by oncology.  Recommended to continue anticoagulation.   -Previously followed by Vascular at [facility] -Continue Xarelto -Referral to Vascular to establish care at patients request.

## 2023-11-17 ENCOUNTER — Encounter: Payer: Self-pay | Admitting: Family Medicine

## 2023-11-17 DIAGNOSIS — R944 Abnormal results of kidney function studies: Secondary | ICD-10-CM | POA: Insufficient documentation

## 2023-11-17 DIAGNOSIS — Z780 Asymptomatic menopausal state: Secondary | ICD-10-CM | POA: Insufficient documentation

## 2023-11-17 MED ORDER — VITAMIN D (ERGOCALCIFEROL) 1.25 MG (50000 UNIT) PO CAPS: 50000.0000 [IU] | ORAL_CAPSULE | ORAL | 1 refills | Status: AC

## 2023-11-17 MED ORDER — LEVOTHYROXINE SODIUM 50 MCG PO TABS: 50.0000 ug | ORAL_TABLET | Freq: Every day | ORAL | 3 refills | Status: AC

## 2023-11-17 NOTE — Assessment & Plan Note (Signed)
 Reports change in bowel habits. Requesting GI referral for follow up.  Was following with GI in [place]. -Referral to GI

## 2023-11-17 NOTE — Assessment & Plan Note (Signed)
 Previous CT A/P 12/22  completed by former PCP in [place] showed moderate colonic stool burden and changes consistent with chronic adenomyomatosis of the gallbladder. She is requesting surgical referral.

## 2023-11-18 ENCOUNTER — Ambulatory Visit
Admission: RE | Admit: 2023-11-18 | Discharge: 2023-11-18 | Disposition: A | Discharge status: Home / Self Care | Payer: Medicare HMO | Source: Ambulatory Visit | Attending: Family Medicine | Admitting: Family Medicine

## 2023-11-18 ENCOUNTER — Other Ambulatory Visit: Payer: Self-pay

## 2023-11-18 DIAGNOSIS — K8020 Calculus of gallbladder without cholecystitis without obstruction: Secondary | ICD-10-CM | POA: Diagnosis not present

## 2023-11-18 DIAGNOSIS — R1011 Right upper quadrant pain: Secondary | ICD-10-CM | POA: Insufficient documentation

## 2023-11-18 DIAGNOSIS — K7689 Other specified diseases of liver: Secondary | ICD-10-CM | POA: Diagnosis not present

## 2023-11-18 MED ORDER — RIVAROXABAN 10 MG PO TABS: 10.0000 mg | ORAL_TABLET | Freq: Every day | ORAL | 0 refills | Status: DC

## 2023-11-18 NOTE — Telephone Encounter (Signed)
 Lvm to let pt know Noel Christmas was refilled. Okay to relay message

## 2023-11-18 NOTE — Telephone Encounter (Signed)
 Spoke to pt about labs. Pt stated that she needed a temporary refill on Xarelto til she's able to find a new vascular doctor.she is completely out. Medication has been pended with pharmacy as Jordan Hawks

## 2023-11-20 ENCOUNTER — Other Ambulatory Visit: Payer: Self-pay

## 2023-11-20 ENCOUNTER — Encounter: Payer: Self-pay | Admitting: Emergency Medicine

## 2023-11-20 ENCOUNTER — Ambulatory Visit
Admission: EM | Admit: 2023-11-20 | Discharge: 2023-11-20 | Disposition: A | Discharge status: Home / Self Care | Attending: Emergency Medicine | Admitting: Emergency Medicine

## 2023-11-20 DIAGNOSIS — J069 Acute upper respiratory infection, unspecified: Secondary | ICD-10-CM

## 2023-11-20 LAB — POC COVID19/FLU A&B COMBO
Covid Antigen, POC: NEGATIVE
Influenza A Antigen, POC: NEGATIVE
Influenza B Antigen, POC: NEGATIVE

## 2023-11-20 LAB — POCT RAPID STREP A (OFFICE): Rapid Strep A Screen: NEGATIVE

## 2023-11-20 MED ORDER — ALBUTEROL SULFATE HFA 108 (90 BASE) MCG/ACT IN AERS
2.0000 | INHALATION_SPRAY | RESPIRATORY_TRACT | 0 refills | Status: AC | PRN
Start: 2023-11-20 — End: ?

## 2023-11-20 MED ORDER — PROMETHAZINE-DM 6.25-15 MG/5ML PO SYRP: 2.5000 mL | ORAL_SOLUTION | Freq: Every evening | ORAL | 0 refills | Status: DC | PRN

## 2023-11-20 MED ORDER — BENZONATATE 100 MG PO CAPS: 100.0000 mg | ORAL_CAPSULE | Freq: Three times a day (TID) | ORAL | 0 refills | Status: AC

## 2023-11-20 MED ORDER — PREDNISONE 10 MG (21) PO TBPK: ORAL_TABLET | Freq: Every day | ORAL | 0 refills | Status: DC

## 2023-11-20 NOTE — Discharge Instructions (Signed)
 Your symptoms today are most likely being caused by a virus and should steadily improve in time it can take up to 7 to 10 days before you truly start to see a turnaround however things will get better  COVID, flu and strep test negative You may use inhaler or nebulizer treatment as needed for wheezing  If wheezing continues or you begin to have shortness of breath take prednisone every morning with food as directed  use Tessalon pill every 8 hours for cough, may use cough syrup at bedtime for additional comfort    You can take Tylenol and/or Ibuprofen as needed for fever reduction and pain relief.   For cough: honey 1/2 to 1 teaspoon (you can dilute the honey in water or another fluid).  You can also use guaifenesin and dextromethorphan for cough. You can use a humidifier for chest congestion and cough.  If you don't have a humidifier, you can sit in the bathroom with the hot shower running.      For sore throat: try warm salt water gargles, cepacol lozenges, throat spray, warm tea or water with lemon/honey, popsicles or ice, or OTC cold relief medicine for throat discomfort.   For congestion: take a daily anti-histamine like Zyrtec, Claritin, and a oral decongestant, such as pseudoephedrine.  You can also use Flonase 1-2 sprays in each nostril daily.   It is important to stay hydrated: drink plenty of fluids (water, gatorade/powerade/pedialyte, juices, or teas) to keep your throat moisturized and help further relieve irritation/discomfort.

## 2023-11-20 NOTE — ED Triage Notes (Signed)
 Patient presents to [facility] for evaluation of sore throat and chest congestion since yesterday.

## 2023-11-20 NOTE — ED Notes (Addendum)
 Called for patient in lobby, no answer.   Attempted to call patient, goes straight to voicemail

## 2023-11-20 NOTE — ED Provider Notes (Signed)
 Renaldo Fiddler    CSN: [id number] Arrival date & time: 11/20/23  1049      History   Chief Complaint Chief Complaint  Patient presents with   URI    HPI Christine Manning is a 78 y.o. female.   Patient presents for evaluation of a nonproductive cough, wheezing at nighttime, sore throat, bilateral ear, chest congestion, intermittent sinus pressure, postnasal drip and headaches beginning 1 day ago.  No known sick contacts.  Has attempted use of Coricidin.  History of asthma.  Denies shortness of breath.  Past Medical History:  Diagnosis Date   Asthma    Colon polyp    DDD (degenerative disc disease)    Hyperlipidemia    Hypertension    Hyperthyroidism    Personal history of DVT (deep vein thrombosis) 09/20/1978   x 2, pregnanacy and immobility induced    Traveling blood clot in leg [facility])     Patient Active Problem List   Diagnosis Date Noted   Postmenopausal estrogen deficiency 11/17/2023   Decreased GFR 11/17/2023   Iron deficiency anemia 11/16/2023   Vitamin B 12 deficiency 11/16/2023   Type 2 diabetes mellitus with complications (HCC) 11/16/2023   Breast cancer screening by mammogram 11/16/2023   Need for hepatitis C screening test 11/16/2023   Abdominal pain, RUQ 11/16/2023   Postthrombotic syndrome 11/16/2023   Hypercoagulable state, secondary (HCC) 11/16/2023   History of Helicobacter pylori infection 06/09/2023   Low iron 03/16/2023   Chronic bilateral low back pain with right-sided sciatica 11/11/2022   Chronic pain of both shoulders 11/11/2022   Palpitations 04/01/2022   Shortness of breath 04/01/2022   RUQ abdominal pain 01/28/2022   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   Primary osteoarthritis of left knee 06/11/2021   Anticoagulated 03/16/2021   Mild intermittent asthma without complication 03/16/2021   Pulmonary nodules 03/16/2021   Microscopic hematuria 11/19/2020   Medical home patient encounter 07/08/2019   Ingrown toenail  03/12/2019   Thrombophilia (HCC) 03/12/2019   Advanced directives, counseling/discussion 03/12/2019   BMI 29.0-29.9,adult 03/12/2019   Osteoporosis without current pathological fracture 03/12/2019   Stress incontinence, female 03/12/2019   H/O partial thyroidectomy 02/16/2019   History of radioactive iodine thyroid ablation 05/23/2018   Lumbago with sciatica, right side 12/27/2017   Chronic midline posterior neck pain 12/27/2017   Hyperlipidemia associated with type 2 diabetes mellitus (HCC) 09/26/2017   Hypertension associated with diabetes (HCC) 09/26/2017   History of DVT (deep vein thrombosis) 09/26/2017   Nocturnal cough 09/19/2017   Herpes zoster 05/10/2017   Type 2 diabetes mellitus with microalbuminuria (HCC) 03/20/2017   Type 2 diabetes mellitus with microalbuminuria, without long-term current use of insulin (HCC) 03/20/2017   Incontinence of feces 01/14/2017   Normocytic anemia 09/15/2016   B12 deficiency 11/13/2015   Encounter for preventive health examination 09/11/2015   Tubular adenoma of colon 09/10/2015   History of bladder suspension procedure 09/08/2014   SI (stress incontinence), female 09/08/2014   Insomnia 06/18/2014   Vitamin D deficiency 08/28/2013   Overweight (BMI 25.0-29.9) 08/14/2013   S/P hysterectomy with oophorectomy 08/13/2013   History of thyroid nodule 08/13/2013   Encounter for Medicare annual wellness exam 08/13/2013   Osteoarthritis of left knee 07/16/2013   Degenerative disk disease 07/16/2013   Personal history of DVT (deep vein thrombosis)    Anxiety 10/11/2012   Irritable bowel syndrome 10/11/2012   Postmenopausal osteoporosis 10/11/2012   Asthma    Hypothyroidism     Past Surgical History:  Procedure Laterality Date   ABDOMINAL HYSTERECTOMY  1991   secondary to abnormal PAPs   ANAL RECTAL MANOMETRY N/A 01/24/2017   Procedure: ANO RECTAL MANOMETRY;  Surgeon: Napoleon Form, MD;  Location: [facility];  Service: Endoscopy;   Laterality: N/A;   BLADDER SUSPENSION  2003   COLONOSCOPY  2016   CORONARY ANGIOPLASTY  2013   arteries were clean .  Dr. Laveda Norman at [facility]    KNEE ARTHROSCOPY      OB History   No obstetric history on file.      Home Medications    Prior to Admission medications   Medication Sig Start Date End Date Taking? Authorizing Provider  albuterol (VENTOLIN HFA) 108 (90 Base) MCG/ACT inhaler Inhale 2 puffs into the lungs every 4 (four) hours as needed for wheezing or shortness of breath. 11/20/23  Yes Gokul Waybright R, NP  benzonatate (TESSALON) 100 MG capsule Take 1 capsule (100 mg total) by mouth every 8 (eight) hours. 11/20/23  Yes Chalee Hirota R, NP  predniSONE (STERAPRED UNI-PAK 21 TAB) 10 MG (21) TBPK tablet Take by mouth daily. Take 6 tabs by mouth daily  for 1 days, then 5 tabs for 1 days, then 4 tabs for 1 days, then 3 tabs for 1 days, 2 tabs for 1 days, then 1 tab by mouth daily for 1 days 11/20/23  Yes Gilberto Stanforth R, NP  promethazine-dextromethorphan (PROMETHAZINE-DM) 6.25-15 MG/5ML syrup Take 2.5 mLs by mouth at bedtime as needed. 11/20/23  Yes Paeton Studer, Hansel Starling R, NP  acetaminophen (TYLENOL) 650 MG CR tablet Take 1,300 mg by mouth as needed.    [provider]  ascorbic acid (VITAMIN C) 1000 MG tablet Take 1 tablet by mouth daily.    [provider]  atorvastatin (LIPITOR) 80 MG tablet Take 80 mg by mouth daily.    [provider]  Cholecalciferol (VITAMIN D-1000 MAX ST) 25 MCG (1000 UT) tablet Take 1,000 Units by mouth daily.    [provider]  cyanocobalamin (VITAMIN B12) 1000 MCG/ML injection Inject 1,000 mcg into the muscle every 30 (thirty) days. 01/20/23   [provider]  gabapentin (NEURONTIN) 300 MG capsule Take 1 capsule (300 mg total) by mouth 3 (three) times daily as needed. Patient taking differently: Take 300 mg by mouth at bedtime. 12/09/17   Sherlene Shams, MD  hydrochlorothiazide (HYDRODIURIL) 12.5 MG tablet Take 12.5 mg by mouth  daily.    [provider]  ipratropium-albuterol (DUONEB) 0.5-2.5 (3) MG/3ML SOLN Inhale 3 mLs into the lungs every 6 (six) hours as needed. 09/19/23   [provider]  levothyroxine (SYNTHROID) 50 MCG tablet Take 1 tablet (50 mcg total) by mouth daily. 11/17/23   Dana Allan, MD  lisinopril (ZESTRIL) 20 MG tablet Take 20 mg by mouth daily.    [provider]  loratadine (CLARITIN) 10 MG tablet Take 1 tablet (10 mg total) by mouth daily. 07/03/15   Glori Luis, MD  MAGNESIUM PO Take 1 tablet by mouth daily.    [provider]  metFORMIN (GLUCOPHAGE) 500 MG tablet Take 500 mg by mouth daily.    [provider]  mometasone (NASONEX) 50 MCG/ACT nasal spray Place 2 sprays into the nose daily.    [provider]  Multiple Vitamin (MULTIVITAMIN) tablet Take 1 tablet by mouth daily.    [provider]  ondansetron (ZOFRAN-ODT) 8 MG disintegrating tablet Take 8 mg by mouth as needed. 08/11/22   [provider]  rivaroxaban (  XARELTO) 10 MG TABS tablet Take 1 tablet (10 mg total) by mouth daily. 11/18/23   Dana Allan, MD  triamcinolone cream (KENALOG) 0.1 % Apply 1 Application topically 2 (two) times daily.    [provider]  Vitamin D, Ergocalciferol, (DRISDOL) 1.25 MG (50000 UNIT) CAPS capsule Take 1 capsule (50,000 Units total) by mouth every 7 (seven) days. 11/17/23   Dana Allan, MD    Family History Family History  Problem Relation Age of Onset   Mental illness Mother        multi infarct dementia   Stroke Mother    Asthma Mother    Mental illness Father        alzheimers dementia   Asthma Brother    Hernia Brother    Asthma Son    Colon cancer Maternal Aunt 38   Breast cancer Maternal Aunt 65   Uterine cancer Maternal Grandmother    Colon polyps Sister    Graves' disease Sister    Asthma Daughter    Colon cancer Cousin        40's   Diabetes Neg Hx    Kidney disease Neg Hx    Esophageal cancer  Neg Hx     Social History Social History   Tobacco Use   Smoking status: Never   Smokeless tobacco: Never  Substance Use Topics   Alcohol use: No   Drug use: No     Allergies   Codeine, Latex, Augmentin [amoxicillin-pot clavulanate], Morphine, and Morphine and codeine   Review of Systems Review of Systems   Physical Exam Triage Vital Signs ED Triage Vitals  Encounter Vitals Group     BP 11/20/23 1154 (!) 117/59     Systolic BP Percentile --      Diastolic BP Percentile --      Pulse Rate 11/20/23 1154 81     Resp 11/20/23 1154 18     Temp 11/20/23 1154 98.8 F (37.1 C)     Temp Source 11/20/23 1154 Oral     SpO2 11/20/23 1154 97 %     Weight --      Height --      Head Circumference --      Peak Flow --      Pain Score 11/20/23 1156 9     Pain Loc --      Pain Education --      Exclude from Growth Chart --    No data found.  Updated Vital Signs BP (!) 117/59   Pulse 81   Temp 98.8 F (37.1 C) (Oral)   Resp 18   SpO2 97%   Visual Acuity Right Eye Distance:   Left Eye Distance:   Bilateral Distance:    Right Eye Near:   Left Eye Near:    Bilateral Near:     Physical Exam Constitutional:      Appearance: Normal appearance.  HENT:     Right Ear: Tympanic membrane, ear canal and external ear normal.     Left Ear: Tympanic membrane, ear canal and external ear normal.     Nose: Congestion present. No rhinorrhea.     Mouth/Throat:     Mouth: Mucous membranes are moist.     Pharynx: Oropharynx is clear. No oropharyngeal exudate or posterior oropharyngeal erythema.  Eyes:     Extraocular Movements: Extraocular movements intact.  Cardiovascular:     Rate and Rhythm: Normal rate and regular rhythm.     Pulses: Normal pulses.  Heart sounds: Normal heart sounds.  Pulmonary:     Effort: Pulmonary effort is normal.     Breath sounds: Normal breath sounds.  Neurological:     Mental Status: She is alert and oriented to person, place, and time.  Mental status is at baseline.      UC Treatments / Results  Labs (all labs ordered are listed, but only abnormal results are displayed) Labs Reviewed  POC COVID19/FLU A&B COMBO - Normal  POCT RAPID STREP A (OFFICE) - Normal    EKG   Radiology No results found.  Procedures Procedures (including critical care time)  Medications Ordered in UC Medications - No data to display  Initial Impression / Assessment and Plan / UC Course  I have reviewed the triage vital signs and the nursing notes.  Pertinent labs & imaging results that were available during my care of the patient were reviewed by me and considered in my medical decision making (see chart for details).  Viral uri with cough  Patient is in no signs of distress nor toxic appearing.  Vital signs are stable.  Low suspicion for pneumonia, pneumothorax or bronchitis and therefore will defer imaging.  Covid, Flu and strep negative.  Prescribed albuterol inhaler, Tessalon, prednisone and Promethazine DM.May use additional over-the-counter medications as needed for supportive care.  May follow-up with urgent care as needed if symptoms persist or worsen.   Final Clinical Impressions(s) / UC Diagnoses   Final diagnoses:  Viral URI with cough     Discharge Instructions      Your symptoms today are most likely being caused by a virus and should steadily improve in time it can take up to 7 to 10 days before you truly start to see a turnaround however things will get better  COVID, flu and strep test negative You may use inhaler or nebulizer treatment as needed for wheezing  If wheezing continues or you begin to have shortness of breath take prednisone every morning with food as directed  use Tessalon pill every 8 hours for cough, may use cough syrup at bedtime for additional comfort    You can take Tylenol and/or Ibuprofen as needed for fever reduction and pain relief.   For cough: honey 1/2 to 1 teaspoon (you can  dilute the honey in water or another fluid).  You can also use guaifenesin and dextromethorphan for cough. You can use a humidifier for chest congestion and cough.  If you don't have a humidifier, you can sit in the bathroom with the hot shower running.      For sore throat: try warm salt water gargles, cepacol lozenges, throat spray, warm tea or water with lemon/honey, popsicles or ice, or OTC cold relief medicine for throat discomfort.   For congestion: take a daily anti-histamine like Zyrtec, Claritin, and a oral decongestant, such as pseudoephedrine.  You can also use Flonase 1-2 sprays in each nostril daily.   It is important to stay hydrated: drink plenty of fluids (water, gatorade/powerade/pedialyte, juices, or teas) to keep your throat moisturized and help further relieve irritation/discomfort.    ED Prescriptions     Medication Sig Dispense Auth. Provider   predniSONE (STERAPRED UNI-PAK 21 TAB) 10 MG (21) TBPK tablet Take by mouth daily. Take 6 tabs by mouth daily  for 1 days, then 5 tabs for 1 days, then 4 tabs for 1 days, then 3 tabs for 1 days, 2 tabs for 1 days, then 1 tab by mouth daily for 1 days  21 tablet Rosalee Tolley R, NP   benzonatate (TESSALON) 100 MG capsule Take 1 capsule (100 mg total) by mouth every 8 (eight) hours. 21 capsule Yulia Ulrich R, NP   promethazine-dextromethorphan (PROMETHAZINE-DM) 6.25-15 MG/5ML syrup Take 2.5 mLs by mouth at bedtime as needed. 118 mL Stephanne Greeley R, NP   albuterol (VENTOLIN HFA) 108 (90 Base) MCG/ACT inhaler Inhale 2 puffs into the lungs every 4 (four) hours as needed for wheezing or shortness of breath. 17 each Valinda Hoar, NP      PDMP not reviewed this encounter.   Valinda Hoar, NP 11/20/23 1241

## 2023-11-21 ENCOUNTER — Other Ambulatory Visit: Payer: Self-pay | Admitting: Family Medicine

## 2023-11-21 ENCOUNTER — Telehealth: Payer: Self-pay

## 2023-11-21 NOTE — Telephone Encounter (Signed)
 Copied from CRM [id number]. Topic: Clinical - Prescription Issue >> Nov 21, 2023  3:12 PM [place] J wrote: Reason for CRM: Patient recently changed pharmacy to [facility]. She would like to know if Dr. Clent Ridges could transfer all medications over.

## 2023-11-21 NOTE — Telephone Encounter (Unsigned)
 Copied from CRM [id number]. Topic: Clinical - Medication Refill >> Nov 21, 2023  3:10 PM [place] J wrote: Most Recent Primary Care Visit:  Provider: Dana Allan  Department: [facility]  Visit Type: NEW PATIENT  Date: 11/10/2023  Medication: accu-chek diabetes strips  Has the patient contacted their pharmacy? Yes (Agent: If no, request that the patient contact the pharmacy for the refill. If patient does not wish to contact the pharmacy document the reason why and proceed with request.) (Agent: If yes, when and what did the pharmacy advise?)  Is this the correct pharmacy for this prescription? Yes If no, delete pharmacy and type the correct one.  This is the patient's preferred pharmacy:  [facility]   Has the prescription been filled recently? No  Is the patient out of the medication? Yes  Has the patient been seen for an appointment in the last year OR does the patient have an upcoming appointment? Yes  Can we respond through MyChart? Yes  Agent: Please be advised that Rx refills may take up to 3 business days. We ask that you follow-up with your pharmacy.

## 2023-11-21 NOTE — Telephone Encounter (Signed)
 Copied from CRM [id number]. Topic: Clinical - Medication Refill >> Nov 21, 2023  3:06 PM [place] J wrote: Most Recent Primary Care Visit:  Provider: Dana Allan  Department: [facility]  Visit Type: NEW PATIENT  Date: 11/10/2023  Medication: rivaroxaban (XARELTO) 10 MG TABS tablet  Has the patient contacted their pharmacy? Yes (Agent: If no, request that the patient contact the pharmacy for the refill. If patient does not wish to contact the pharmacy document the reason why and proceed with request.) (Agent: If yes, when and what did the pharmacy advise?)  Is this the correct pharmacy for this prescription? Yes If no, delete pharmacy and type the correct one.  This is the patient's preferred pharmacy:   [facility] ([facility])  Has the prescription been filled recently? No  Is the patient out of the medication? Yes  Has the patient been seen for an appointment in the last year OR does the patient have an upcoming appointment? Yes  Can we respond through MyChart? Yes  Agent: Please be advised that Rx refills may take up to 3 business days. We ask that you follow-up with your pharmacy.

## 2023-11-22 MED ORDER — RIVAROXABAN 10 MG PO TABS: 10.0000 mg | ORAL_TABLET | Freq: Every day | ORAL | 0 refills | Status: DC

## 2023-11-22 NOTE — Addendum Note (Signed)
 Addended by: Kristie Cowman on: 11/22/2023 09:02 AM   Modules accepted: Orders

## 2023-11-22 NOTE — Telephone Encounter (Signed)
 Last Fill: Xarelto: Sent Today, confirmed rcvd by pharmacy     Test Strips: Pt is requesting test strips, not on current med list  Last OV: 11/10/23 Next OV: 11/24/23  Routing to provider for review/authorization.

## 2023-11-22 NOTE — Telephone Encounter (Signed)
 Spoke to pt. Pt needed her Xaralto sent to [facility] due to pricing being to high at [facility]. Medication has been sent in

## 2023-11-24 ENCOUNTER — Ambulatory Visit: Payer: Medicare HMO | Admitting: Family Medicine

## 2023-11-24 MED ORDER — BLOOD GLUCOSE MONITORING SUPPL DEVI: 1.0000 | Freq: Three times a day (TID) | 0 refills | Status: AC

## 2023-11-24 MED ORDER — LANCETS MISC. MISC: 1.0000 | Freq: Three times a day (TID) | 0 refills | Status: AC

## 2023-11-24 MED ORDER — LANCET DEVICE MISC: 1.0000 | Freq: Three times a day (TID) | 0 refills | Status: AC

## 2023-11-24 MED ORDER — BLOOD GLUCOSE TEST VI STRP: 1.0000 | ORAL_STRIP | Freq: Three times a day (TID) | 0 refills | Status: AC

## 2023-11-25 ENCOUNTER — Encounter: Payer: Self-pay | Admitting: Family Medicine

## 2023-11-25 ENCOUNTER — Ambulatory Visit (INDEPENDENT_AMBULATORY_CARE_PROVIDER_SITE_OTHER): Admitting: Family Medicine

## 2023-11-25 VITALS — BP 120/66 | HR 60 | Temp 98.3°F | Resp 20 | Ht <= 58 in | Wt 122.5 lb

## 2023-11-25 DIAGNOSIS — Z7984 Long term (current) use of oral hypoglycemic drugs: Secondary | ICD-10-CM | POA: Diagnosis not present

## 2023-11-25 DIAGNOSIS — K8020 Calculus of gallbladder without cholecystitis without obstruction: Secondary | ICD-10-CM

## 2023-11-25 DIAGNOSIS — E890 Postprocedural hypothyroidism: Secondary | ICD-10-CM | POA: Diagnosis not present

## 2023-11-25 DIAGNOSIS — E1129 Type 2 diabetes mellitus with other diabetic kidney complication: Secondary | ICD-10-CM

## 2023-11-25 DIAGNOSIS — R809 Proteinuria, unspecified: Secondary | ICD-10-CM | POA: Diagnosis not present

## 2023-11-25 DIAGNOSIS — J4520 Mild intermittent asthma, uncomplicated: Secondary | ICD-10-CM

## 2023-11-25 DIAGNOSIS — R1011 Right upper quadrant pain: Secondary | ICD-10-CM

## 2023-11-25 DIAGNOSIS — Z1231 Encounter for screening mammogram for malignant neoplasm of breast: Secondary | ICD-10-CM

## 2023-11-25 DIAGNOSIS — D6869 Other thrombophilia: Secondary | ICD-10-CM

## 2023-11-25 NOTE — Patient Instructions (Addendum)
 It was a pleasure meeting you today. Thank you for allowing me to take part in your health care.  Our goals for today as we discussed include:  Ultrasound shows small stones in the gallbladder.  Referral sent to General Surgery for evaluation  Follow up with GI doctor for cyst in liver  Referral sent to surgery for evaluation of gallstones  Referral sent to Endocrinology   Schedule lab appointment 4 weeks after starting Levothyroxine 50 mcg daily  Referral sent for Mammogram. Please call to schedule appointment. [facility] [address] [place], [place] [address] [phone]     This is a list of the screening recommended for you and due dates:  Health Maintenance  Topic Date Due   Eye exam for diabetics  05/20/2018   Complete foot exam   09/19/2018   Mammogram  01/23/2021   COVID-19 Vaccine (8 - 2024-25 season) 11/03/2023   Medicare Annual Wellness Visit  03/13/2024   Hemoglobin A1C  05/09/2024   DTaP/Tdap/Td vaccine (2 - Td or Tdap) 06/18/2024   Yearly kidney function blood test for diabetes  11/09/2024   Yearly kidney health urinalysis for diabetes  11/09/2024   Colon Cancer Screening  11/28/2024   Pneumonia Vaccine  Completed   Flu Shot  Completed   DEXA scan (bone density measurement)  Completed   Hepatitis C Screening  Completed   Zoster (Shingles) Vaccine  Completed   HPV Vaccine  Aged Out      If you have any questions or concerns, please do not hesitate to call the office at [phone].  I look forward to our next visit and until then take care and stay safe.  Regards,   Dana Allan, MD   [facility]

## 2023-11-25 NOTE — Progress Notes (Signed)
 SUBJECTIVE:   Chief Complaint  Patient presents with   Diabetes    2 week follow up   HPI Presents for follow up chronic disease management  Discussed the use of AI scribe software for clinical note transcription with the patient, who gave verbal consent to proceed.  History of Present Illness The patient presents for a follow-up visit to review referrals and manage diabetes-related care.  She is here to ensure that all necessary referrals have been sent and to complete a foot exam related to her diabetes management. She has not yet received appointments for the referrals sent for vascular, cardiology, and endocrinology consultations.  She visited urgent care on Sunday due to a sore throat and cough. She was prescribed medications during this visit, which added to her existing prescription costs. No chest pain or shortness of breath.  She experiences occasional numbness and tingling in her legs. She manages her foot care herself, including cutting her toenails, and uses oil for dryness. She wears compression socks to manage swelling.  She has a history of blood clots and is on medication for this condition. She encountered issues with the cost of her medication at [facility] and has requested that her prescriptions be sent to [facility] instead.  She recently received a prescription for an albuterol inhaler after experiencing an asthma attack triggered by perfumes at a funeral. She finds the inhaler helpful in managing her symptoms.  She is currently taking Synthroid (levothyroxine) and is transitioning from a 75 mcg dose to a 50 mcg dose. She is awaiting the new prescription and plans to have her thyroid levels rechecked four weeks after starting the new dose.  She inquires about the results of an ultrasound she had done last Friday, as she has not yet received the report.    PERTINENT PMH / PSH: As above  OBJECTIVE:  BP 120/66   Pulse 60   Temp 98.3 F (36.8 C)   Resp 20    Ht 4\' 10"  (1.473 m)   Wt 122 lb 8 oz (55.6 kg)   SpO2 100%   BMI 25.60 kg/m    Physical Exam Vitals reviewed.  Constitutional:      General: She is not in acute distress.    Appearance: Normal appearance. She is not ill-appearing, toxic-appearing or diaphoretic.  Eyes:     General:        Right eye: No discharge.        Left eye: No discharge.     Conjunctiva/sclera: Conjunctivae normal.  Cardiovascular:     Rate and Rhythm: Normal rate and regular rhythm.     Heart sounds: Normal heart sounds.  Pulmonary:     Effort: Pulmonary effort is normal.     Breath sounds: Normal breath sounds.  Musculoskeletal:        General: Normal range of motion.  Skin:    General: Skin is warm and dry.  Neurological:     General: No focal deficit present.     Mental Status: She is alert and oriented to person, place, and time. Mental status is at baseline.  Psychiatric:        Mood and Affect: Mood normal.        Behavior: Behavior normal.        Thought Content: Thought content normal.        Judgment: Judgment normal.           11/25/2023    2:02 PM 11/10/2023    2:22 PM  12/28/2016   10:36 AM 08/24/2016   11:08 AM 09/10/2015   10:07 AM  Depression screen PHQ 2/9  Decreased Interest 0 0 0 0 0  Down, Depressed, Hopeless 0 0 0 0 1  PHQ - 2 Score 0 0 0 0 1  Altered sleeping 1 1     Tired, decreased energy 1 1     Change in appetite 0 0     Feeling bad or failure about yourself  0 0     Trouble concentrating 0 0     Moving slowly or fidgety/restless 0 0     Suicidal thoughts 0 0     PHQ-9 Score 2 2     Difficult doing work/chores Not difficult at all Not difficult at all         11/25/2023    2:02 PM 11/10/2023    2:22 PM  GAD 7 : Generalized Anxiety Score  Nervous, Anxious, on Edge 0 0  Control/stop worrying 0 0  Worry too much - different things 0 0  Trouble relaxing 0 0  Restless 0 0  Easily annoyed or irritable 0 0  Afraid - awful might happen 0 0  Total GAD 7 Score 0 0   Anxiety Difficulty Not difficult at all Not difficult at all    ASSESSMENT/PLAN:  Postoperative hypothyroidism Assessment & Plan: Recent TSH low.  Has not decreased dose of Levothyroxine from 75 mcg to 50 mcg  - Start 50 mcg levothyroxine. - Order thyroid function tests four weeks after starting new dose. - Referral sent to Endocrinology  Orders: -     Ambulatory referral to Endocrinology  Abdominal pain, RUQ Assessment & Plan: Recent RUQ u/s showing cholelithiasis without inflammation.  Continues to have intermittent pain -Refer to surgery for evaluation  Orders: -     Ambulatory referral to General Surgery  Calculus of gallbladder without cholecystitis without obstruction -     Ambulatory referral to General Surgery  Breast cancer screening by mammogram -     3D Screening Mammogram, Left and Right; Future  Type 2 diabetes mellitus with microalbuminuria, without long-term current use of insulin (HCC) Assessment & Plan: Routine follow-up for diabetes management. Advised on foot care and use of compression socks. - Annual foot exam today - Advise continued use of compression socks. - Continue Metformin 500 mg daily   Hypercoagulable state, secondary (HCC) Assessment & Plan: On anticoagulation therapy for history of DVT. Difficulty obtaining medication due to cost issues. - Send anticoagulation prescription to [facility] pharmacy.   Mild intermittent asthma, unspecified whether complicated Assessment & Plan: Controlled with Albuterol inhaler and nebulizer.  No history of smoking. -Continue current treatment.    PDMP reviewed  Return if symptoms worsen or fail to improve, for PCP.  Dana Allan, MD

## 2023-11-29 NOTE — H&P (View-Only) (Signed)
 Patient ID: Christine Manning, female   DOB: [birth year], 78 y.o.   MRN: [id number]  Chief Complaint:  RUQ pain  History of Present Illness Christine Manning Christine Manning is a 78 y.o. female with about a year of post-prandial RUQ pain.  Denies N/V, F/C.  Has been avoiding fried foods, but doesn't associate particular foods with evoking the pain.  Possibly radiating to right shoulder.  No h/o jaundice, or hepatitis, denies any melena or acholic stools.  No prior non-pelvic abdominal surgery. Has IBS w/ diarrhea.  Takes Xarelto for h/o DVT.    Past Medical History Past Medical History:  Diagnosis Date   Asthma    Colon polyp    DDD (degenerative disc disease)    Hyperlipidemia    Hypertension    Hyperthyroidism    Personal history of DVT (deep vein thrombosis) 09/20/1978   x 2, pregnanacy and immobility induced    Traveling blood clot in leg [facility])       Past Surgical History:  Procedure Laterality Date   ABDOMINAL HYSTERECTOMY  1991   secondary to abnormal PAPs   ANAL RECTAL MANOMETRY N/A 01/24/2017   Procedure: ANO RECTAL MANOMETRY;  Surgeon: Napoleon Form, MD;  Location: [facility];  Service: Endoscopy;  Laterality: N/A;   BLADDER SUSPENSION  2003   COLONOSCOPY  2016   CORONARY ANGIOPLASTY  2013   arteries were clean .  [facility] Laveda Norman at [facility]    KNEE ARTHROSCOPY      Allergies  Allergen Reactions   Codeine Hives and Shortness Of Breath   Latex Hives and Swelling   Augmentin [Amoxicillin-Pot Clavulanate] Other (See Comments)    Causes IBS Causes IBS   Morphine Nausea Only   Morphine And Codeine Nausea And Vomiting    Current Outpatient Medications  Medication Sig Dispense Refill   acetaminophen (TYLENOL) 650 MG CR tablet Take 1,300 mg by mouth as needed.     albuterol (VENTOLIN HFA) 108 (90 Base) MCG/ACT inhaler Inhale 2 puffs into the lungs every 4 (four) hours as needed for wheezing or shortness of breath. 17 each 0   ascorbic acid (VITAMIN C) 1000 MG tablet Take 1 tablet by  mouth daily.     atorvastatin (LIPITOR) 80 MG tablet Take 80 mg by mouth daily.     benzonatate (TESSALON) 100 MG capsule Take 1 capsule (100 mg total) by mouth every 8 (eight) hours. 21 capsule 0   Blood Glucose Monitoring Suppl DEVI 1 each by Does not apply route in the morning, at noon, and at bedtime. May substitute to any manufacturer covered by patient's insurance. 1 each 0   Cholecalciferol (VITAMIN D-1000 MAX ST) 25 MCG (1000 UT) tablet Take 1,000 Units by mouth daily.     cyanocobalamin (VITAMIN B12) 1000 MCG/ML injection Inject 1,000 mcg into the muscle every 30 (thirty) days.     gabapentin (NEURONTIN) 300 MG capsule Take 1 capsule (300 mg total) by mouth 3 (three) times daily as needed. (Patient taking differently: Take 300 mg by mouth at bedtime.) 90 capsule 5   Glucose Blood (BLOOD GLUCOSE TEST STRIPS) STRP 1 each by In Vitro route in the morning, at noon, and at bedtime. May substitute to any manufacturer covered by patient's insurance. 100 strip 0   hydrochlorothiazide (HYDRODIURIL) 12.5 MG tablet Take 12.5 mg by mouth daily.     ipratropium-albuterol (DUONEB) 0.5-2.5 (3) MG/3ML SOLN Inhale 3 mLs into the lungs every 6 (six) hours as needed.     Lancet Device MISC  1 each by Does not apply route in the morning, at noon, and at bedtime. May substitute to any manufacturer covered by patient's insurance. 1 each 0   Lancets Misc. MISC 1 each by Does not apply route in the morning, at noon, and at bedtime. May substitute to any manufacturer covered by patient's insurance. 100 each 0   levothyroxine (SYNTHROID) 50 MCG tablet Take 1 tablet (50 mcg total) by mouth daily. 90 tablet 3   lisinopril (ZESTRIL) 20 MG tablet Take 20 mg by mouth daily.     loratadine (CLARITIN) 10 MG tablet Take 1 tablet (10 mg total) by mouth daily. 30 tablet 0   MAGNESIUM PO Take 1 tablet by mouth daily.     metFORMIN (GLUCOPHAGE) 500 MG tablet Take 500 mg by mouth daily.     mometasone (NASONEX) 50 MCG/ACT nasal  spray Place 2 sprays into the nose daily.     Multiple Vitamin (MULTIVITAMIN) tablet Take 1 tablet by mouth daily.     ondansetron (ZOFRAN-ODT) 8 MG disintegrating tablet Take 8 mg by mouth as needed.     predniSONE (STERAPRED UNI-PAK 21 TAB) 10 MG (21) TBPK tablet Take by mouth daily. Take 6 tabs by mouth daily  for 1 days, then 5 tabs for 1 days, then 4 tabs for 1 days, then 3 tabs for 1 days, 2 tabs for 1 days, then 1 tab by mouth daily for 1 days 21 tablet 0   promethazine-dextromethorphan (PROMETHAZINE-DM) 6.25-15 MG/5ML syrup Take 2.5 mLs by mouth at bedtime as needed. 118 mL 0   rivaroxaban (XARELTO) 10 MG TABS tablet Take 1 tablet (10 mg total) by mouth daily. 90 tablet 0   triamcinolone cream (KENALOG) 0.1 % Apply 1 Application topically 2 (two) times daily.     Vitamin D, Ergocalciferol, (DRISDOL) 1.25 MG (50000 UNIT) CAPS capsule Take 1 capsule (50,000 Units total) by mouth every 7 (seven) days. 12 capsule 1   No current facility-administered medications for this visit.    Family History Family History  Problem Relation Age of Onset   Mental illness Mother        multi infarct dementia   Stroke Mother    Asthma Mother    Mental illness Father        alzheimers dementia   Asthma Brother    Hernia Brother    Asthma Son    Colon cancer Maternal Aunt 60   Breast cancer Maternal Aunt 5   Uterine cancer Maternal Grandmother    Colon polyps Sister    Graves' disease Sister    Asthma Daughter    Colon cancer Cousin        40's   Diabetes Neg Hx    Kidney disease Neg Hx    Esophageal cancer Neg Hx       Social History Social History   Tobacco Use   Smoking status: Never   Smokeless tobacco: Never  Substance Use Topics   Alcohol use: No   Drug use: No        Review of Systems  Constitutional:  Negative for chills, fever and weight loss.  HENT: Negative.    Eyes: Negative.   Respiratory:  Negative for cough and shortness of breath.   Cardiovascular:  Negative  for chest pain.  Gastrointestinal:  Positive for abdominal pain and diarrhea. Negative for blood in stool and melena.  Genitourinary: Negative.   Skin: Negative.   Neurological: Negative.   Psychiatric/Behavioral: Negative.  Physical Exam Blood pressure 109/74, pulse 80, temperature 98.4 F (36.9 C), temperature source Oral, height 4\' 11"  (1.499 m), weight 122 lb 3.2 oz (55.4 kg), SpO2 95%. Last Weight  Most recent update: 12/01/2023  1:33 PM    Weight  55.4 kg (122 lb 3.2 oz)             CONSTITUTIONAL: Well developed, and nourished, appropriately responsive and aware without distress.   EYES: Sclera non-icteric.   EARS, NOSE, MOUTH AND THROAT:  The oropharynx is clear. Oral mucosa is pink and moist. Hearing is intact to voice.  NECK: Trachea is midline, and there is no jugular venous distension.  LYMPH NODES:  Lymph nodes in the neck are not appreciated. RESPIRATORY:  Lungs are clear, and breath sounds are equal bilaterally.  Normal respiratory effort without pathologic use of accessory muscles. CARDIOVASCULAR: Heart is regular in rate and rhythm.   Well perfused.  GI: The abdomen is  soft, nontender, and nondistended. There were no palpable masses.  I did not appreciate hepatosplenomegaly. There were normal bowel sounds. MUSCULOSKELETAL:  Symmetrical muscle tone appreciated in all four extremities.    SKIN: Skin turgor is normal. No pathologic skin lesions appreciated.  NEUROLOGIC:  Motor and sensation appear grossly normal.  Cranial nerves are grossly without defect. PSYCH:  Alert and oriented to person, place and time. Affect is appropriate for situation.  Data Reviewed I have personally reviewed what is currently available of the patient's imaging, recent labs and medical records.   Labs:     Latest Ref Rng & Units 11/10/2023    3:10 PM 12/22/2017    8:26 AM 11/08/2017    1:49 PM  CBC  WBC 4.0 - 10.5 K/uL 4.9  5.7  4.4   Hemoglobin 12.0 - 15.0 g/dL 84.1  32.4  40.1    Hematocrit 36.0 - 46.0 % 35.9  32.3  32.1   Platelets 150.0 - 400.0 K/uL 292.0  323.0  385.0       Latest Ref Rng & Units 11/10/2023    3:10 PM 09/19/2017   10:07 AM 03/17/2017    9:56 AM  CMP  Glucose 70 - 99 mg/dL 78  93  027   BUN 6 - 23 mg/dL 18  9  14    Creatinine 0.40 - 1.20 mg/dL 2.53  6.64  4.03   Sodium 135 - 145 mEq/L 141  141  142   Potassium 3.5 - 5.1 mEq/L 4.1  4.1  4.1   Chloride 96 - 112 mEq/L 103  105  107   CO2 19 - 32 mEq/L 28  28  29    Calcium 8.4 - 10.5 mg/dL 9.1  8.9  9.0   Total Protein 6.0 - 8.3 g/dL 7.5  7.3  6.7   Total Bilirubin 0.2 - 1.2 mg/dL 0.6  0.3  0.3   Alkaline Phos 39 - 117 U/L 46  55  57   AST 0 - 37 U/L 16  20  15    ALT 0 - 35 U/L 8  13  9     Imaging: Radiological images reviewed:  CLINICAL DATA:  Right upper quadrant pain   EXAM: ULTRASOUND ABDOMEN LIMITED RIGHT UPPER QUADRANT   COMPARISON:  Ultrasound abdomen 04/10/2009   FINDINGS: Gallbladder:   Small stones. No gallbladder wall thickening or pericholecystic fluid. Negative sonographic Murphy's sign.   Common bile duct:   Diameter: 2.4 mm   Liver:   There is a 1.8 cm septated cyst right hepatic  lobe. Normal parenchymal echogenicity. Portal vein is patent on color Doppler imaging with normal direction of blood flow towards the liver.   Other: None.   IMPRESSION: Cholelithiasis without secondary signs of acute cholecystitis.     Electronically Signed   By: Annia Belt M.D.   On: 11/18/2023 22:56   Within last 24 hrs: No results found.  Assessment    Chronic calculus cholecystitis with biliary colic  Patient Active Problem List   Diagnosis Date Noted   CCC (chronic calculous cholecystitis) 12/02/2023   Postmenopausal estrogen deficiency 11/17/2023   Decreased GFR 11/17/2023   Iron deficiency anemia 11/16/2023   Vitamin B 12 deficiency 11/16/2023   Type 2 diabetes mellitus with complications (HCC) 11/16/2023   Breast cancer screening by mammogram 11/16/2023    Need for hepatitis C screening test 11/16/2023   Postthrombotic syndrome 11/16/2023   Hypercoagulable state, secondary (HCC) 11/16/2023   History of Helicobacter pylori infection 06/09/2023   Low iron 03/16/2023   Chronic bilateral low back pain with right-sided sciatica 11/11/2022   Chronic pain of both shoulders 11/11/2022   Palpitations 04/01/2022   Shortness of breath 04/01/2022   RUQ abdominal pain 01/28/2022   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   Primary osteoarthritis of left knee 06/11/2021   Anticoagulated 03/16/2021   Mild intermittent asthma without complication 03/16/2021   Pulmonary nodules 03/16/2021   Microscopic hematuria 11/19/2020   Medical home patient encounter 07/08/2019   Ingrown toenail 03/12/2019   Thrombophilia (HCC) 03/12/2019   Advanced directives, counseling/discussion 03/12/2019   BMI 29.0-29.9,adult 03/12/2019   Osteoporosis without current pathological fracture 03/12/2019   Stress incontinence, female 03/12/2019   H/O partial thyroidectomy 02/16/2019   History of radioactive iodine thyroid ablation 05/23/2018   Lumbago with sciatica, right side 12/27/2017   Chronic midline posterior neck pain 12/27/2017   Hyperlipidemia associated with type 2 diabetes mellitus (HCC) 09/26/2017   Hypertension associated with diabetes (HCC) 09/26/2017   History of DVT (deep vein thrombosis) 09/26/2017   Nocturnal cough 09/19/2017   Herpes zoster 05/10/2017   Type 2 diabetes mellitus with microalbuminuria, without long-term current use of insulin (HCC) 03/20/2017   Incontinence of feces 01/14/2017   Normocytic anemia 09/15/2016   B12 deficiency 11/13/2015   Encounter for preventive health examination 09/11/2015   Tubular adenoma of colon 09/10/2015   History of bladder suspension procedure 09/08/2014   SI (stress incontinence), female 09/08/2014   Insomnia 06/18/2014   Vitamin D deficiency 08/28/2013   Overweight (BMI 25.0-29.9) 08/14/2013   S/P  hysterectomy with oophorectomy 08/13/2013   History of thyroid nodule 08/13/2013   Encounter for Medicare annual wellness exam 08/13/2013   Osteoarthritis of left knee 07/16/2013   Degenerative disk disease 07/16/2013   Personal history of DVT (deep vein thrombosis)    Anxiety 10/11/2012   Irritable bowel syndrome 10/11/2012   Postmenopausal osteoporosis 10/11/2012   Asthma    Hypothyroidism     Plan    This was discussed thoroughly.  Optimal plan is for robotic cholecystectomy utilizing ICG imaging. Risks and benefits have been discussed with the patient which include but are not limited to anesthesia, bleeding, infection, biliary ductal injury, resulting in leak or stenosis, other associated unanticipated injuries affiliated with laparoscopic surgery.   Reviewed that removing the gallbladder will only address the symptoms related to the gallbladder itself.  I believe there is the desire to proceed, accepting the risks with understanding.  Questions elicited and answered to satisfaction.    No guarantees ever expressed or implied.  Face-to-face time spent with the patient and accompanying care providers(if present) was 30 minutes, spent counseling, educating, and coordinating care of the patient.    These notes generated with voice recognition software. I apologize for typographical errors.  [name] M.D., FACS 12/02/2023, 10:03 AM

## 2023-11-29 NOTE — Progress Notes (Unsigned)
 Patient ID: Christine Manning, female   DOB: 12/21/1945, 78 y.o.   MRN: [id number]  Chief Complaint:  RUQ pain  History of Present Illness Christine Manning is a 78 y.o. female with about a year of post-prandial RUQ pain.  Denies N/V, F/C.  Has been avoiding fried foods, but doesn't associate particular foods with evoking the pain.  Possibly radiating to right shoulder.  No h/o jaundice, or hepatitis, denies any melena or acholic stools.  No prior non-pelvic abdominal surgery. Has IBS w/ diarrhea.  Takes Xarelto for h/o DVT.    Past Medical History Past Medical History:  Diagnosis Date   Asthma    Colon polyp    DDD (degenerative disc disease)    Hyperlipidemia    Hypertension    Hyperthyroidism    Personal history of DVT (deep vein thrombosis) 09/20/1978   x 2, pregnanacy and immobility induced    Traveling blood clot in leg [facility])       Past Surgical History:  Procedure Laterality Date   ABDOMINAL HYSTERECTOMY  1991   secondary to abnormal PAPs   ANAL RECTAL MANOMETRY N/A 01/24/2017   Procedure: ANO RECTAL MANOMETRY;  Surgeon: Napoleon Form, MD;  Location: [facility];  Service: Endoscopy;  Laterality: N/A;   BLADDER SUSPENSION  2003   COLONOSCOPY  2016   CORONARY ANGIOPLASTY  2013   arteries were clean .  [facility] Laveda Norman at [facility]    KNEE ARTHROSCOPY      Allergies  Allergen Reactions   Codeine Hives and Shortness Of Breath   Latex Hives and Swelling   Augmentin [Amoxicillin-Pot Clavulanate] Other (See Comments)    Causes IBS Causes IBS   Morphine Nausea Only   Morphine And Codeine Nausea And Vomiting    Current Outpatient Medications  Medication Sig Dispense Refill   acetaminophen (TYLENOL) 650 MG CR tablet Take 1,300 mg by mouth as needed.     albuterol (VENTOLIN HFA) 108 (90 Base) MCG/ACT inhaler Inhale 2 puffs into the lungs every 4 (four) hours as needed for wheezing or shortness of breath. 17 each 0   ascorbic acid (VITAMIN C) 1000 MG tablet Take 1 tablet by  mouth daily.     atorvastatin (LIPITOR) 80 MG tablet Take 80 mg by mouth daily.     benzonatate (TESSALON) 100 MG capsule Take 1 capsule (100 mg total) by mouth every 8 (eight) hours. 21 capsule 0   Blood Glucose Monitoring Suppl DEVI 1 each by Does not apply route in the morning, at noon, and at bedtime. May substitute to any manufacturer covered by patient's insurance. 1 each 0   Cholecalciferol (VITAMIN D-1000 MAX ST) 25 MCG (1000 UT) tablet Take 1,000 Units by mouth daily.     cyanocobalamin (VITAMIN B12) 1000 MCG/ML injection Inject 1,000 mcg into the muscle every 30 (thirty) days.     gabapentin (NEURONTIN) 300 MG capsule Take 1 capsule (300 mg total) by mouth 3 (three) times daily as needed. (Patient taking differently: Take 300 mg by mouth at bedtime.) 90 capsule 5   Glucose Blood (BLOOD GLUCOSE TEST STRIPS) STRP 1 each by In Vitro route in the morning, at noon, and at bedtime. May substitute to any manufacturer covered by patient's insurance. 100 strip 0   hydrochlorothiazide (HYDRODIURIL) 12.5 MG tablet Take 12.5 mg by mouth daily.     ipratropium-albuterol (DUONEB) 0.5-2.5 (3) MG/3ML SOLN Inhale 3 mLs into the lungs every 6 (six) hours as needed.     Lancet Device MISC  1 each by Does not apply route in the morning, at noon, and at bedtime. May substitute to any manufacturer covered by patient's insurance. 1 each 0   Lancets Misc. MISC 1 each by Does not apply route in the morning, at noon, and at bedtime. May substitute to any manufacturer covered by patient's insurance. 100 each 0   levothyroxine (SYNTHROID) 50 MCG tablet Take 1 tablet (50 mcg total) by mouth daily. 90 tablet 3   lisinopril (ZESTRIL) 20 MG tablet Take 20 mg by mouth daily.     loratadine (CLARITIN) 10 MG tablet Take 1 tablet (10 mg total) by mouth daily. 30 tablet 0   MAGNESIUM PO Take 1 tablet by mouth daily.     metFORMIN (GLUCOPHAGE) 500 MG tablet Take 500 mg by mouth daily.     mometasone (NASONEX) 50 MCG/ACT nasal  spray Place 2 sprays into the nose daily.     Multiple Vitamin (MULTIVITAMIN) tablet Take 1 tablet by mouth daily.     ondansetron (ZOFRAN-ODT) 8 MG disintegrating tablet Take 8 mg by mouth as needed.     predniSONE (STERAPRED UNI-PAK 21 TAB) 10 MG (21) TBPK tablet Take by mouth daily. Take 6 tabs by mouth daily  for 1 days, then 5 tabs for 1 days, then 4 tabs for 1 days, then 3 tabs for 1 days, 2 tabs for 1 days, then 1 tab by mouth daily for 1 days 21 tablet 0   promethazine-dextromethorphan (PROMETHAZINE-DM) 6.25-15 MG/5ML syrup Take 2.5 mLs by mouth at bedtime as needed. 118 mL 0   rivaroxaban (XARELTO) 10 MG TABS tablet Take 1 tablet (10 mg total) by mouth daily. 90 tablet 0   triamcinolone cream (KENALOG) 0.1 % Apply 1 Application topically 2 (two) times daily.     Vitamin D, Ergocalciferol, (DRISDOL) 1.25 MG (50000 UNIT) CAPS capsule Take 1 capsule (50,000 Units total) by mouth every 7 (seven) days. 12 capsule 1   No current facility-administered medications for this visit.    Family History Family History  Problem Relation Age of Onset   Mental illness Mother        multi infarct dementia   Stroke Mother    Asthma Mother    Mental illness Father        alzheimers dementia   Asthma Brother    Hernia Brother    Asthma Son    Colon cancer Maternal Aunt 60   Breast cancer Maternal Aunt 78   Uterine cancer Maternal Grandmother    Colon polyps Sister    Graves' disease Sister    Asthma Daughter    Colon cancer Cousin        40's   Diabetes Neg Hx    Kidney disease Neg Hx    Esophageal cancer Neg Hx       Social History Social History   Tobacco Use   Smoking status: Never   Smokeless tobacco: Never  Substance Use Topics   Alcohol use: No   Drug use: No        Review of Systems  Constitutional:  Negative for chills, fever and weight loss.  HENT: Negative.    Eyes: Negative.   Respiratory:  Negative for cough and shortness of breath.   Cardiovascular:  Negative  for chest pain.  Gastrointestinal:  Positive for abdominal pain and diarrhea. Negative for blood in stool and melena.  Genitourinary: Negative.   Skin: Negative.   Neurological: Negative.   Psychiatric/Behavioral: Negative.  Physical Exam Blood pressure 109/74, pulse 80, temperature 98.4 F (36.9 C), temperature source Oral, height 4\' 11"  (1.499 m), weight 122 lb 3.2 oz (55.4 kg), SpO2 95%. Last Weight  Most recent update: 12/01/2023  1:33 PM    Weight  55.4 kg (122 lb 3.2 oz)             CONSTITUTIONAL: Well developed, and nourished, appropriately responsive and aware without distress.   EYES: Sclera non-icteric.   EARS, NOSE, MOUTH AND THROAT:  The oropharynx is clear. Oral mucosa is pink and moist. Hearing is intact to voice.  NECK: Trachea is midline, and there is no jugular venous distension.  LYMPH NODES:  Lymph nodes in the neck are not appreciated. RESPIRATORY:  Lungs are clear, and breath sounds are equal bilaterally.  Normal respiratory effort without pathologic use of accessory muscles. CARDIOVASCULAR: Heart is regular in rate and rhythm.   Well perfused.  GI: The abdomen is  soft, nontender, and nondistended. There were no palpable masses.  I did not appreciate hepatosplenomegaly. There were normal bowel sounds. MUSCULOSKELETAL:  Symmetrical muscle tone appreciated in all four extremities.    SKIN: Skin turgor is normal. No pathologic skin lesions appreciated.  NEUROLOGIC:  Motor and sensation appear grossly normal.  Cranial nerves are grossly without defect. PSYCH:  Alert and oriented to person, place and time. Affect is appropriate for situation.  Data Reviewed I have personally reviewed what is currently available of the patient's imaging, recent labs and medical records.   Labs:     Latest Ref Rng & Units 11/10/2023    3:10 PM 12/22/2017    8:26 AM 11/08/2017    1:49 PM  CBC  WBC 4.0 - 10.5 K/uL 4.9  5.7  4.4   Hemoglobin 12.0 - 15.0 g/dL 40.9  81.1  91.4    Hematocrit 36.0 - 46.0 % 35.9  32.3  32.1   Platelets 150.0 - 400.0 K/uL 292.0  323.0  385.0       Latest Ref Rng & Units 11/10/2023    3:10 PM 09/19/2017   10:07 AM 03/17/2017    9:56 AM  CMP  Glucose 70 - 99 mg/dL 78  93  782   BUN 6 - 23 mg/dL 18  9  14    Creatinine 0.40 - 1.20 mg/dL 9.56  2.13  0.86   Sodium 135 - 145 mEq/L 141  141  142   Potassium 3.5 - 5.1 mEq/L 4.1  4.1  4.1   Chloride 96 - 112 mEq/L 103  105  107   CO2 19 - 32 mEq/L 28  28  29    Calcium 8.4 - 10.5 mg/dL 9.1  8.9  9.0   Total Protein 6.0 - 8.3 g/dL 7.5  7.3  6.7   Total Bilirubin 0.2 - 1.2 mg/dL 0.6  0.3  0.3   Alkaline Phos 39 - 117 U/L 46  55  57   AST 0 - 37 U/L 16  20  15    ALT 0 - 35 U/L 8  13  9     Imaging: Radiological images reviewed:  CLINICAL DATA:  Right upper quadrant pain   EXAM: ULTRASOUND ABDOMEN LIMITED RIGHT UPPER QUADRANT   COMPARISON:  Ultrasound abdomen 04/10/2009   FINDINGS: Gallbladder:   Small stones. No gallbladder wall thickening or pericholecystic fluid. Negative sonographic Murphy's sign.   Common bile duct:   Diameter: 2.4 mm   Liver:   There is a 1.8 cm septated cyst right hepatic  lobe. Normal parenchymal echogenicity. Portal vein is patent on color Doppler imaging with normal direction of blood flow towards the liver.   Other: None.   IMPRESSION: Cholelithiasis without secondary signs of acute cholecystitis.     Electronically Signed   By: Annia Belt M.D.   On: 11/18/2023 22:56   Within last 24 hrs: No results found.  Assessment    Chronic calculus cholecystitis with biliary colic  Patient Active Problem List   Diagnosis Date Noted   CCC (chronic calculous cholecystitis) 12/02/2023   Postmenopausal estrogen deficiency 11/17/2023   Decreased GFR 11/17/2023   Iron deficiency anemia 11/16/2023   Vitamin B 12 deficiency 11/16/2023   Type 2 diabetes mellitus with complications (HCC) 11/16/2023   Breast cancer screening by mammogram 11/16/2023    Need for hepatitis C screening test 11/16/2023   Postthrombotic syndrome 11/16/2023   Hypercoagulable state, secondary (HCC) 11/16/2023   History of Helicobacter pylori infection 06/09/2023   Low iron 03/16/2023   Chronic bilateral low back pain with right-sided sciatica 11/11/2022   Chronic pain of both shoulders 11/11/2022   Palpitations 04/01/2022   Shortness of breath 04/01/2022   RUQ abdominal pain 01/28/2022   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   Primary osteoarthritis of left knee 06/11/2021   Anticoagulated 03/16/2021   Mild intermittent asthma without complication 03/16/2021   Pulmonary nodules 03/16/2021   Microscopic hematuria 11/19/2020   Medical home patient encounter 07/08/2019   Ingrown toenail 03/12/2019   Thrombophilia (HCC) 03/12/2019   Advanced directives, counseling/discussion 03/12/2019   BMI 29.0-29.9,adult 03/12/2019   Osteoporosis without current pathological fracture 03/12/2019   Stress incontinence, female 03/12/2019   H/O partial thyroidectomy 02/16/2019   History of radioactive iodine thyroid ablation 05/23/2018   Lumbago with sciatica, right side 12/27/2017   Chronic midline posterior neck pain 12/27/2017   Hyperlipidemia associated with type 2 diabetes mellitus (HCC) 09/26/2017   Hypertension associated with diabetes (HCC) 09/26/2017   History of DVT (deep vein thrombosis) 09/26/2017   Nocturnal cough 09/19/2017   Herpes zoster 05/10/2017   Type 2 diabetes mellitus with microalbuminuria, without long-term current use of insulin (HCC) 03/20/2017   Incontinence of feces 01/14/2017   Normocytic anemia 09/15/2016   B12 deficiency 11/13/2015   Encounter for preventive health examination 09/11/2015   Tubular adenoma of colon 09/10/2015   History of bladder suspension procedure 09/08/2014   SI (stress incontinence), female 09/08/2014   Insomnia 06/18/2014   Vitamin D deficiency 08/28/2013   Overweight (BMI 25.0-29.9) 08/14/2013   S/P  hysterectomy with oophorectomy 08/13/2013   History of thyroid nodule 08/13/2013   Encounter for Medicare annual wellness exam 08/13/2013   Osteoarthritis of left knee 07/16/2013   Degenerative disk disease 07/16/2013   Personal history of DVT (deep vein thrombosis)    Anxiety 10/11/2012   Irritable bowel syndrome 10/11/2012   Postmenopausal osteoporosis 10/11/2012   Asthma    Hypothyroidism     Plan    This was discussed thoroughly.  Optimal plan is for robotic cholecystectomy utilizing ICG imaging. Risks and benefits have been discussed with the patient which include but are not limited to anesthesia, bleeding, infection, biliary ductal injury, resulting in leak or stenosis, other associated unanticipated injuries affiliated with laparoscopic surgery.   Reviewed that removing the gallbladder will only address the symptoms related to the gallbladder itself.  I believe there is the desire to proceed, accepting the risks with understanding.  Questions elicited and answered to satisfaction.    No guarantees ever expressed or implied.  Face-to-face time spent with the patient and accompanying care providers(if present) was 30 minutes, spent counseling, educating, and coordinating care of the patient.    These notes generated with voice recognition software. I apologize for typographical errors.  [name] M.D., FACS 12/02/2023, 10:03 AM

## 2023-11-30 ENCOUNTER — Encounter: Payer: Self-pay | Admitting: Family Medicine

## 2023-11-30 NOTE — Assessment & Plan Note (Signed)
 Routine follow-up for diabetes management. Advised on foot care and use of compression socks. - Annual foot exam today - Advise continued use of compression socks. - Continue Metformin 500 mg daily

## 2023-11-30 NOTE — Assessment & Plan Note (Signed)
 Recent RUQ u/s showing cholelithiasis without inflammation.  Continues to have intermittent pain -Refer to surgery for evaluation

## 2023-11-30 NOTE — Assessment & Plan Note (Signed)
 On anticoagulation therapy for history of DVT. Difficulty obtaining medication due to cost issues. - Send anticoagulation prescription to [facility] pharmacy.

## 2023-11-30 NOTE — Assessment & Plan Note (Signed)
 Recent TSH low.  Has not decreased dose of Levothyroxine from 75 mcg to 50 mcg  - Start 50 mcg levothyroxine. - Order thyroid function tests four weeks after starting new dose. - Referral sent to Endocrinology

## 2023-11-30 NOTE — Assessment & Plan Note (Signed)
 Controlled with Albuterol inhaler and nebulizer.  No history of smoking. -Continue current treatment.

## 2023-12-01 ENCOUNTER — Ambulatory Visit (INDEPENDENT_AMBULATORY_CARE_PROVIDER_SITE_OTHER): Payer: Medicaid Other | Admitting: Surgery

## 2023-12-01 ENCOUNTER — Encounter: Payer: Self-pay | Admitting: Surgery

## 2023-12-01 VITALS — BP 109/74 | HR 80 | Temp 98.4°F | Ht 59.0 in | Wt 122.2 lb

## 2023-12-01 DIAGNOSIS — K8010 Calculus of gallbladder with chronic cholecystitis without obstruction: Secondary | ICD-10-CM

## 2023-12-01 NOTE — Patient Instructions (Signed)
 You have requested to have your gallbladder removed. This will be done at [facility] with Dr. Claudine Mouton.  You will most likely be out of work 1-2 weeks for this surgery.  If you have FMLA or disability paperwork that needs filled out you may drop this off at our office or this can be faxed to [phone].  You will return after your post-op appointment with a lifting restriction for approximately 4 more weeks.  You will be able to eat anything you would like to following surgery. But, start by eating a bland diet and advance this as tolerated. The Gallbladder diet is below, please go as closely by this diet as possible prior to surgery to avoid any further attacks.  Please see the (blue)pre-care form that you have been given today. Our surgery scheduler will call you to verify surgery date and to go over information.   If you have any questions, please call our office.  Laparoscopic Cholecystectomy Laparoscopic cholecystectomy is surgery to remove the gallbladder. The gallbladder is located in the upper right part of the abdomen, behind the liver. It is a storage sac for bile, which is produced in the liver. Bile aids in the digestion and absorption of fats. Cholecystectomy is often done for inflammation of the gallbladder (cholecystitis). This condition is usually caused by a buildup of gallstones (cholelithiasis) in the gallbladder. Gallstones can block the flow of bile, and that can result in inflammation and pain. In severe cases, emergency surgery may be required. If emergency surgery is not required, you will have time to prepare for the procedure. Laparoscopic surgery is an alternative to open surgery. Laparoscopic surgery has a shorter recovery time. Your common bile duct may also need to be examined during the procedure. If stones are found in the common bile duct, they may be removed. LET [facility] CARE PROVIDER KNOW ABOUT: Any allergies you have. All medicines you are taking,  including vitamins, herbs, eye drops, creams, and over-the-counter medicines. Previous problems you or members of your family have had with the use of anesthetics. Any blood disorders you have. Previous surgeries you have had.  Any medical conditions you have. RISKS AND COMPLICATIONS Generally, this is a safe procedure. However, problems may occur, including: Infection. Bleeding. Allergic reactions to medicines. Damage to other structures or organs. A stone remaining in the common bile duct. A bile leak from the cyst duct that is clipped when your gallbladder is removed. The need to convert to open surgery, which requires a larger incision in the abdomen. This may be necessary if your surgeon thinks that it is not safe to continue with a laparoscopic procedure. BEFORE THE PROCEDURE Ask your health care provider about: Changing or stopping your regular medicines. This is especially important if you are taking diabetes medicines or blood thinners. Taking medicines such as aspirin and ibuprofen. These medicines can thin your blood. Do not take these medicines before your procedure if your health care provider instructs you not to. Follow instructions from your health care provider about eating or drinking restrictions. Let your health care provider know if you develop a cold or an infection before surgery. Plan to have someone take you home after the procedure. Ask your health care provider how your surgical site will be marked or identified. You may be given antibiotic medicine to help prevent infection. PROCEDURE To reduce your risk of infection: Your health care team will wash or sanitize their hands. Your skin will be washed with soap. An IV  tube may be inserted into one of your veins. You will be given a medicine to make you fall asleep (general anesthetic). A breathing tube will be placed in your mouth. The surgeon will make several small cuts (incisions) in your abdomen. A thin,  lighted tube (laparoscope) that has a tiny camera on the end will be inserted through one of the small incisions. The camera on the laparoscope will send a picture to a TV screen (monitor) in the operating room. This will give the surgeon a good view inside your abdomen. A gas will be pumped into your abdomen. This will expand your abdomen to give the surgeon more room to perform the surgery. Other tools that are needed for the procedure will be inserted through the other incisions. The gallbladder will be removed through one of the incisions. After your gallbladder has been removed, the incisions will be closed with stitches (sutures), staples, or skin glue. Your incisions may be covered with a bandage (dressing). The procedure may vary among health care providers and hospitals. AFTER THE PROCEDURE Your blood pressure, heart rate, breathing rate, and blood oxygen level will be monitored often until the medicines you were given have worn off. You will be given medicines as needed to control your pain.   This information is not intended to replace advice given to you by your health care provider. Make sure you discuss any questions you have with your health care provider.   Document Released: 09/06/2005 Document Revised: 05/28/2015 Document Reviewed: 04/18/2013 Elsevier Interactive Patient Education 2016 [employer] Inc[employer]   Low-Fat Diet for Gallbladder Conditions A low-fat diet can be helpful if you have pancreatitis or a gallbladder condition. With these conditions, your pancreas and gallbladder have trouble digesting fats. A healthy eating plan with less fat will help rest your pancreas and gallbladder and reduce your symptoms. WHAT DO I NEED TO KNOW ABOUT THIS DIET? Eat a low-fat diet. Reduce your fat intake to less than 20-30% of your total daily calories. This is less than 50-60 g of fat per day. Remember that you need some fat in your diet. Ask your dietician what your daily goal should  be. Choose nonfat and low-fat healthy foods. Look for the words "nonfat," "low fat," or "fat free." As a guide, look on the label and choose foods with less than 3 g of fat per serving. Eat only one serving. Avoid alcohol. Do not smoke. If you need help quitting, talk with your health care provider. Eat small frequent meals instead of three large heavy meals. WHAT FOODS CAN I EAT? Grains Include healthy grains and starches such as potatoes, wheat bread, fiber-rich cereal, and brown rice. Choose whole grain options whenever possible. In adults, whole grains should account for 45-65% of your daily calories.  Fruits and Vegetables Eat plenty of fruits and vegetables. Fresh fruits and vegetables add fiber to your diet. Meats and Other Protein Sources Eat lean meat such as chicken and pork. Trim any fat off of meat before cooking it. Eggs, fish, and beans are other sources of protein. In adults, these foods should account for 10-35% of your daily calories. Dairy Choose low-fat milk and dairy options. Dairy includes fat and protein, as well as calcium.  Fats and Oils Limit high-fat foods such as fried foods, sweets, baked goods, sugary drinks.  Other Creamy sauces and condiments, such as mayonnaise, can add extra fat. Think about whether or not you need to use them, or use smaller amounts or low fat options.  WHAT FOODS ARE NOT RECOMMENDED? High fat foods, such as: [employer]. Ice cream. [place] toast. Sweet rolls. Pizza. Cheese bread. Foods covered with batter, butter, creamy sauces, or cheese. Fried foods. Sugary drinks and desserts. Foods that cause gas or bloating   This information is not intended to replace advice given to you by your health care provider. Make sure you discuss any questions you have with your health care provider.   Document Released: 09/11/2013 Document Reviewed: 09/11/2013 Elsevier Interactive Patient Education [employer]

## 2023-12-02 ENCOUNTER — Telehealth: Payer: Self-pay | Admitting: Surgery

## 2023-12-02 ENCOUNTER — Ambulatory Visit: Payer: Self-pay | Admitting: Surgery

## 2023-12-02 DIAGNOSIS — K8010 Calculus of gallbladder with chronic cholecystitis without obstruction: Secondary | ICD-10-CM | POA: Insufficient documentation

## 2023-12-02 HISTORY — DX: Calculus of gallbladder with chronic cholecystitis without obstruction: K80.10

## 2023-12-02 NOTE — Telephone Encounter (Signed)
 Patient has been advised of Pre-Admission date/time, and Surgery date at [facility].  Surgery Date: 12/21/23 Preadmission Testing Date: 12/12/23 (phone 8a-1p)  Patient has been made aware to call [phone], between 1-3:00pm the day before surgery, to find out what time to arrive for surgery.

## 2023-12-12 ENCOUNTER — Encounter
Admission: RE | Admit: 2023-12-12 | Discharge: 2023-12-12 | Disposition: A | Discharge status: Home / Self Care | Source: Ambulatory Visit | Attending: Surgery | Admitting: Surgery

## 2023-12-12 ENCOUNTER — Other Ambulatory Visit: Payer: Self-pay

## 2023-12-12 VITALS — Ht 59.0 in | Wt 120.0 lb

## 2023-12-12 DIAGNOSIS — Z01812 Encounter for preprocedural laboratory examination: Secondary | ICD-10-CM

## 2023-12-12 DIAGNOSIS — I152 Hypertension secondary to endocrine disorders: Secondary | ICD-10-CM

## 2023-12-12 DIAGNOSIS — Z01810 Encounter for preprocedural cardiovascular examination: Secondary | ICD-10-CM

## 2023-12-12 HISTORY — DX: Proteinuria, unspecified: R80.9

## 2023-12-12 HISTORY — DX: Other primary thrombophilia: D68.59

## 2023-12-12 HISTORY — DX: Nausea with vomiting, unspecified: Z98.890

## 2023-12-12 HISTORY — DX: Personal history of other infectious and parasitic diseases: Z86.19

## 2023-12-12 HISTORY — DX: Unilateral primary osteoarthritis, left knee: M17.12

## 2023-12-12 HISTORY — DX: Vitamin D deficiency, unspecified: E55.9

## 2023-12-12 HISTORY — DX: Personal history of irradiation: Z92.3

## 2023-12-12 HISTORY — DX: Other nonspecific abnormal finding of lung field: R91.8

## 2023-12-12 HISTORY — DX: Nontoxic single thyroid nodule: E04.1

## 2023-12-12 HISTORY — DX: Proteinuria, unspecified: E11.29

## 2023-12-12 HISTORY — DX: Age-related osteoporosis without current pathological fracture: M81.0

## 2023-12-12 HISTORY — DX: Deficiency of other specified B group vitamins: E53.8

## 2023-12-12 HISTORY — DX: Cardiac murmur, unspecified: R01.1

## 2023-12-12 HISTORY — DX: Other specified postprocedural states: R11.2

## 2023-12-12 HISTORY — DX: Family history of other specified conditions: Z84.89

## 2023-12-12 NOTE — Patient Instructions (Addendum)
 Your procedure is scheduled on: Wednesday, April 2 Report to the Registration Desk on the 1st floor of the [facility] To find out your arrival time, please call [phone] between 1PM - 3PM on: Tuesday, April 1 If your arrival time is 6:00 am, do not arrive before that time as the [facility] entrance doors do not open until 6:00 am.  REMEMBER: Instructions that are not followed completely may result in serious medical risk, up to and including death; or upon the discretion of your surgeon and anesthesiologist your surgery may need to be rescheduled.  Do not eat or drink after midnight the night before surgery.  No gum chewing or hard candies.  One week prior to surgery: starting March 26 Stop Anti-inflammatories (NSAIDS) such as Advil, Aleve, Ibuprofen, Motrin, Naproxen, Naprosyn and Aspirin based products such as Excedrin, Goody's Powder, BC Powder. Stop ANY OVER THE COUNTER supplements until after surgery. Stop vitamin C, vitamin D, multiple vitamin.  You may however, continue to take Tylenol if needed for pain up until the day of surgery.  metFORMIN - hold for 2 days before surgery. Last day to take is Sunday, March 30. Resume AFTER surgery.  rivaroxaban (XARELTO) - hold for 3 days before surgery. Last day to take is Saturday, March 29. Resume AFTER surgery per surgeon instruction.  Continue taking all of your other prescription medications up until the day of surgery.  ON THE DAY OF SURGERY ONLY TAKE THESE MEDICATIONS WITH SIPS OF WATER:  ipratropium-albuterol (DUONEB) nebulizer levothyroxine (SYNTHROID)   Bring your albuterol inhaler to the hospital.  No Alcohol for 24 hours before or after surgery.  No Smoking including e-cigarettes for 24 hours before surgery.  No chewable tobacco products for at least 6 hours before surgery.  No nicotine patches on the day of surgery.  Do not use any "recreational" drugs for at least a week (preferably 2 weeks) before your  surgery.  Please be advised that the combination of cocaine and anesthesia may have negative outcomes, up to and including death. If you test positive for cocaine, your surgery will be cancelled.  On the morning of surgery brush your teeth with toothpaste and water, you may rinse your mouth with mouthwash if you wish. Do not swallow any toothpaste or mouthwash.  Use CHG Soap as directed on instruction sheet.  Do not wear jewelry, make-up, hairpins, clips or nail polish.  For welded (permanent) jewelry: bracelets, anklets, waist bands, etc.  Please have this removed prior to surgery.  If it is not removed, there is a chance that hospital personnel will need to cut it off on the day of surgery.  Do not wear lotions, powders, or perfumes.   Do not shave body hair from the neck down 48 hours before surgery.  Contact lenses, hearing aids and dentures may not be worn into surgery.  Do not bring valuables to the hospital. [facility] is not responsible for any missing/lost belongings or valuables.   Notify your doctor if there is any change in your medical condition (cold, fever, infection).  Wear comfortable clothing (specific to your surgery type) to the hospital.  After surgery, you can help prevent lung complications by doing breathing exercises.  Take deep breaths and cough every 1-2 hours. Your doctor may order a device called an Incentive Spirometer to help you take deep breaths. When coughing or sneezing, hold a pillow firmly against your incision with both hands. This is called "splinting." Doing this helps protect your incision.  It also decreases belly discomfort.  If you are being discharged the day of surgery, you will not be allowed to drive home. You will need a responsible individual to drive you home and stay with you for 24 hours after surgery.   If you are taking public transportation, you will need to have a responsible individual with you.  Please call the  Pre-admissions Testing Dept. at [phone] if you have any questions about these instructions.  Surgery Visitation Policy:  Patients having surgery or a procedure may have two visitors.  Children under the age of 22 must have an adult with them who is not the patient.  Temporary Visitor Restrictions Due to increasing cases of flu, RSV and COVID-19: Children ages 13 and under will not be able to visit patients in [facility] hospitals under most circumstances.      Preparing for Surgery with CHLORHEXIDINE GLUCONATE (CHG) Soap  Chlorhexidine Gluconate (CHG) Soap  o An antiseptic cleaner that kills germs and bonds with the skin to continue killing germs even after washing  o Used for showering the night before surgery and morning of surgery  Before surgery, you can play an important role by reducing the number of germs on your skin.  CHG (Chlorhexidine gluconate) soap is an antiseptic cleanser which kills germs and bonds with the skin to continue killing germs even after washing.  Please do not use if you have an allergy to CHG or antibacterial soaps. If your skin becomes reddened/irritated stop using the CHG.  1. Shower the NIGHT BEFORE SURGERY and the MORNING OF SURGERY with CHG soap.  2. If you choose to wash your hair, wash your hair first as usual with your normal shampoo.  3. After shampooing, rinse your hair and body thoroughly to remove the shampoo.  4. Use CHG as you would any other liquid soap. You can apply CHG directly to the skin and wash gently with a scrungie or a clean washcloth.  5. Apply the CHG soap to your body only from the neck down. Do not use on open wounds or open sores. Avoid contact with your eyes, ears, mouth, and genitals (private parts). Wash face and genitals (private parts) with your normal soap.  6. Wash thoroughly, paying special attention to the area where your surgery will be performed.  7. Thoroughly rinse your body with warm water.  8.  Do not shower/wash with your normal soap after using and rinsing off the CHG soap.  9. Pat yourself dry with a clean towel.  10. Wear clean pajamas to bed the night before surgery.  12. Place clean sheets on your bed the night of your first shower and do not sleep with pets.  13. Shower again with the CHG soap on the day of surgery prior to arriving at the hospital.  14. Do not apply any deodorants/lotions/powders.  15. Please wear clean clothes to the hospital.

## 2023-12-13 ENCOUNTER — Encounter
Admission: RE | Admit: 2023-12-13 | Discharge: 2023-12-13 | Disposition: A | Discharge status: Home / Self Care | Source: Ambulatory Visit | Attending: Surgery | Admitting: Surgery

## 2023-12-13 DIAGNOSIS — I152 Hypertension secondary to endocrine disorders: Secondary | ICD-10-CM | POA: Insufficient documentation

## 2023-12-13 DIAGNOSIS — E1159 Type 2 diabetes mellitus with other circulatory complications: Secondary | ICD-10-CM | POA: Diagnosis not present

## 2023-12-13 DIAGNOSIS — Z01818 Encounter for other preprocedural examination: Secondary | ICD-10-CM | POA: Insufficient documentation

## 2023-12-13 DIAGNOSIS — Z01812 Encounter for preprocedural laboratory examination: Secondary | ICD-10-CM

## 2023-12-13 DIAGNOSIS — Z01810 Encounter for preprocedural cardiovascular examination: Secondary | ICD-10-CM

## 2023-12-13 LAB — BASIC METABOLIC PANEL
Anion gap: 8 (ref 5–15)
BUN: 19 mg/dL (ref 8–23)
CO2: 26 mmol/L (ref 22–32)
Calcium: 9.1 mg/dL (ref 8.9–10.3)
Chloride: 107 mmol/L (ref 98–111)
Creatinine, Ser: 1.11 mg/dL — ABNORMAL HIGH (ref 0.44–1.00)
GFR, Estimated: 52 mL/min — ABNORMAL LOW (ref >60)
Glucose, Bld: 92 mg/dL (ref 70–99)
Potassium: 3.9 mmol/L (ref 3.5–5.1)
Sodium: 141 mmol/L (ref 135–145)

## 2023-12-16 ENCOUNTER — Other Ambulatory Visit: Payer: Medicare HMO

## 2023-12-21 ENCOUNTER — Ambulatory Visit
Admission: RE | Admit: 2023-12-21 | Discharge: 2023-12-21 | Disposition: A | Discharge status: Home / Self Care | Source: Ambulatory Visit | Attending: Surgery | Admitting: Surgery

## 2023-12-21 ENCOUNTER — Other Ambulatory Visit: Payer: Self-pay

## 2023-12-21 ENCOUNTER — Encounter: Payer: Self-pay | Admitting: Surgery

## 2023-12-21 ENCOUNTER — Ambulatory Visit: Admitting: Certified Registered"

## 2023-12-21 ENCOUNTER — Ambulatory Visit: Payer: Self-pay | Admitting: Urgent Care

## 2023-12-21 ENCOUNTER — Encounter
Admission: RE | Disposition: A | Discharge status: Home / Self Care | Payer: Self-pay | Source: Ambulatory Visit | Attending: Surgery

## 2023-12-21 DIAGNOSIS — Z86718 Personal history of other venous thrombosis and embolism: Secondary | ICD-10-CM | POA: Diagnosis not present

## 2023-12-21 DIAGNOSIS — E119 Type 2 diabetes mellitus without complications: Secondary | ICD-10-CM | POA: Diagnosis not present

## 2023-12-21 DIAGNOSIS — Z7984 Long term (current) use of oral hypoglycemic drugs: Secondary | ICD-10-CM | POA: Insufficient documentation

## 2023-12-21 DIAGNOSIS — Z794 Long term (current) use of insulin: Secondary | ICD-10-CM | POA: Insufficient documentation

## 2023-12-21 DIAGNOSIS — K8010 Calculus of gallbladder with chronic cholecystitis without obstruction: Secondary | ICD-10-CM | POA: Insufficient documentation

## 2023-12-21 DIAGNOSIS — K580 Irritable bowel syndrome with diarrhea: Secondary | ICD-10-CM | POA: Diagnosis not present

## 2023-12-21 DIAGNOSIS — I10 Essential (primary) hypertension: Secondary | ICD-10-CM | POA: Diagnosis not present

## 2023-12-21 DIAGNOSIS — Z01812 Encounter for preprocedural laboratory examination: Secondary | ICD-10-CM

## 2023-12-21 DIAGNOSIS — Z7901 Long term (current) use of anticoagulants: Secondary | ICD-10-CM | POA: Diagnosis not present

## 2023-12-21 DIAGNOSIS — K8020 Calculus of gallbladder without cholecystitis without obstruction: Secondary | ICD-10-CM | POA: Diagnosis not present

## 2023-12-21 DIAGNOSIS — E039 Hypothyroidism, unspecified: Secondary | ICD-10-CM | POA: Diagnosis not present

## 2023-12-21 LAB — GLUCOSE, CAPILLARY
Glucose-Capillary: 80 mg/dL (ref 70–99)
Glucose-Capillary: 155 mg/dL — ABNORMAL HIGH (ref 70–99)
Glucose-Capillary: 130 mg/dL — ABNORMAL HIGH (ref 70–99)

## 2023-12-21 SURGERY — CHOLECYSTECTOMY, ROBOT-ASSISTED, LAPAROSCOPIC
Anesthesia: General | Site: Abdomen

## 2023-12-21 MED ORDER — MIDAZOLAM HCL 5 MG/5ML IJ SOLN
INTRAMUSCULAR | Status: DC | PRN
  Administered 2023-12-21 (×2): 1 mg via INTRAVENOUS

## 2023-12-21 MED ORDER — FENTANYL CITRATE (PF) 100 MCG/2ML IJ SOLN
INTRAMUSCULAR | Status: DC | PRN
Start: 2023-12-21 — End: 2023-12-21
  Administered 2023-12-21 (×2): 50 ug via INTRAVENOUS

## 2023-12-21 MED ORDER — PHENYLEPHRINE 80 MCG/ML (10ML) SYRINGE FOR IV PUSH (FOR BLOOD PRESSURE SUPPORT)
PREFILLED_SYRINGE | INTRAVENOUS | Status: DC | PRN
  Administered 2023-12-21: 80 ug via INTRAVENOUS

## 2023-12-21 MED ORDER — DEXMEDETOMIDINE HCL IN NACL 80 MCG/20ML IV SOLN
INTRAVENOUS | Status: DC | PRN
  Administered 2023-12-21 (×3): 4 ug via INTRAVENOUS

## 2023-12-21 MED ORDER — CELECOXIB 200 MG PO CAPS
200.0000 mg | ORAL_CAPSULE | ORAL | Status: AC
  Administered 2023-12-21: 200 mg via ORAL

## 2023-12-21 MED ORDER — DEXAMETHASONE SODIUM PHOSPHATE 10 MG/ML IJ SOLN
INTRAMUSCULAR | Status: DC | PRN
  Administered 2023-12-21: 10 mg via INTRAVENOUS

## 2023-12-21 MED ORDER — BUPIVACAINE LIPOSOME 1.3 % IJ SUSP: 20.0000 mL | Freq: Once | INTRAMUSCULAR | Status: DC

## 2023-12-21 MED ORDER — IBUPROFEN 800 MG PO TABS
800.0000 mg | ORAL_TABLET | Freq: Three times a day (TID) | ORAL | 0 refills | Status: DC | PRN
Start: 2023-12-21 — End: 2024-01-05

## 2023-12-21 MED ORDER — PROPOFOL 10 MG/ML IV BOLUS
INTRAVENOUS | Status: DC | PRN
  Administered 2023-12-21: 120 mg via INTRAVENOUS

## 2023-12-21 MED ORDER — ONDANSETRON HCL 4 MG/2ML IJ SOLN
INTRAMUSCULAR | Status: DC | PRN
  Administered 2023-12-21: 4 mg via INTRAVENOUS

## 2023-12-21 MED ORDER — MIDAZOLAM HCL 2 MG/2ML IJ SOLN
INTRAMUSCULAR | Status: AC
  Filled 2023-12-21: qty 2

## 2023-12-21 MED ORDER — DROPERIDOL 2.5 MG/ML IJ SOLN: 0.6250 mg | Freq: Once | INTRAMUSCULAR | Status: DC | PRN

## 2023-12-21 MED ORDER — CEFAZOLIN SODIUM-DEXTROSE 2-4 GM/100ML-% IV SOLN
INTRAVENOUS | Status: AC
  Filled 2023-12-21: qty 100

## 2023-12-21 MED ORDER — INDOCYANINE GREEN 25 MG IV SOLR
INTRAVENOUS | Status: AC
  Filled 2023-12-21: qty 10

## 2023-12-21 MED ORDER — BUPIVACAINE-EPINEPHRINE (PF) 0.25% -1:200000 IJ SOLN
INTRAMUSCULAR | Status: AC
  Filled 2023-12-21: qty 30

## 2023-12-21 MED ORDER — SUGAMMADEX SODIUM 200 MG/2ML IV SOLN
INTRAVENOUS | Status: DC | PRN
  Administered 2023-12-21: 200 mg via INTRAVENOUS

## 2023-12-21 MED ORDER — CHLORHEXIDINE GLUCONATE 0.12 % MT SOLN
15.0000 mL | Freq: Once | OROMUCOSAL | Status: AC
  Administered 2023-12-21: 15 mL via OROMUCOSAL

## 2023-12-21 MED ORDER — ACETAMINOPHEN 500 MG PO TABS
ORAL_TABLET | ORAL | Status: AC
  Filled 2023-12-21: qty 2

## 2023-12-21 MED ORDER — INDOCYANINE GREEN 25 MG IV SOLR
1.2500 mg | Freq: Once | INTRAVENOUS | Status: AC
  Administered 2023-12-21: 1.25 mg via INTRAVENOUS

## 2023-12-21 MED ORDER — ONDANSETRON HCL 4 MG PO TABS: 4.0000 mg | ORAL_TABLET | Freq: Three times a day (TID) | ORAL | 0 refills | Status: DC | PRN

## 2023-12-21 MED ORDER — ROCURONIUM BROMIDE 100 MG/10ML IV SOLN
INTRAVENOUS | Status: DC | PRN
  Administered 2023-12-21: 40 mg via INTRAVENOUS
  Administered 2023-12-21: 10 mg via INTRAVENOUS

## 2023-12-21 MED ORDER — CHLORHEXIDINE GLUCONATE CLOTH 2 % EX PADS
6.0000 | MEDICATED_PAD | Freq: Once | CUTANEOUS | Status: AC
  Administered 2023-12-21: 6 via TOPICAL

## 2023-12-21 MED ORDER — CELECOXIB 200 MG PO CAPS
ORAL_CAPSULE | ORAL | Status: AC
  Filled 2023-12-21: qty 1

## 2023-12-21 MED ORDER — PENTAFLUOROPROP-TETRAFLUOROETH EX AERO
INHALATION_SPRAY | CUTANEOUS | Status: AC
  Filled 2023-12-21: qty 30

## 2023-12-21 MED ORDER — LIDOCAINE HCL (CARDIAC) PF 100 MG/5ML IV SOSY
PREFILLED_SYRINGE | INTRAVENOUS | Status: DC | PRN
  Administered 2023-12-21: 50 mg via INTRAVENOUS

## 2023-12-21 MED ORDER — GABAPENTIN 300 MG PO CAPS
300.0000 mg | ORAL_CAPSULE | ORAL | Status: AC
  Administered 2023-12-21: 300 mg via ORAL

## 2023-12-21 MED ORDER — BUPIVACAINE LIPOSOME 1.3 % IJ SUSP
INTRAMUSCULAR | Status: AC
  Filled 2023-12-21: qty 20

## 2023-12-21 MED ORDER — PROPOFOL 10 MG/ML IV BOLUS
INTRAVENOUS | Status: AC
  Filled 2023-12-21: qty 20

## 2023-12-21 MED ORDER — CEFAZOLIN SODIUM-DEXTROSE 2-4 GM/100ML-% IV SOLN
2.0000 g | INTRAVENOUS | Status: AC
  Administered 2023-12-21: 2 g via INTRAVENOUS

## 2023-12-21 MED ORDER — 0.9 % SODIUM CHLORIDE (POUR BTL) OPTIME
TOPICAL | Status: DC | PRN
  Administered 2023-12-21: 500 mL

## 2023-12-21 MED ORDER — FENTANYL CITRATE (PF) 100 MCG/2ML IJ SOLN: 25.0000 ug | INTRAMUSCULAR | Status: DC | PRN

## 2023-12-21 MED ORDER — BUPIVACAINE-EPINEPHRINE 0.25% -1:200000 IJ SOLN
INTRAMUSCULAR | Status: DC | PRN
  Administered 2023-12-21: 50 mL via SURGICAL_CAVITY

## 2023-12-21 MED ORDER — ACETAMINOPHEN 500 MG PO TABS
1000.0000 mg | ORAL_TABLET | ORAL | Status: AC
  Administered 2023-12-21: 1000 mg via ORAL

## 2023-12-21 MED ORDER — GABAPENTIN 300 MG PO CAPS
ORAL_CAPSULE | ORAL | Status: AC
  Filled 2023-12-21: qty 1

## 2023-12-21 MED ORDER — FENTANYL CITRATE (PF) 100 MCG/2ML IJ SOLN
INTRAMUSCULAR | Status: AC
  Filled 2023-12-21: qty 2

## 2023-12-21 MED ORDER — SODIUM CHLORIDE 0.9 % IV SOLN: INTRAVENOUS | Status: DC

## 2023-12-21 MED ORDER — CHLORHEXIDINE GLUCONATE 0.12 % MT SOLN
OROMUCOSAL | Status: AC
  Filled 2023-12-21: qty 15

## 2023-12-21 MED ORDER — HYDROCODONE-ACETAMINOPHEN 5-325 MG PO TABS
1.0000 | ORAL_TABLET | Freq: Four times a day (QID) | ORAL | 0 refills | Status: DC | PRN
Start: 2023-12-21 — End: 2024-01-05

## 2023-12-21 MED ORDER — ORAL CARE MOUTH RINSE: 15.0000 mL | Freq: Once | OROMUCOSAL | Status: AC

## 2023-12-21 SURGICAL SUPPLY — 47 items
BAG PRESSURE INF REUSE 3000 (BAG) IMPLANT
CLIP LIGATING HEM O LOK PURPLE (MISCELLANEOUS) ×1 IMPLANT
COVER TIP SHEARS 8 DVNC (MISCELLANEOUS) ×1 IMPLANT
DERMABOND ADVANCED .7 DNX12 (GAUZE/BANDAGES/DRESSINGS) ×1 IMPLANT
DRAPE ARM DVNC X/XI (DISPOSABLE) ×4 IMPLANT
DRAPE COLUMN DVNC XI (DISPOSABLE) ×1 IMPLANT
FORCEPS BPLR R/ABLATION 8 DVNC (INSTRUMENTS) ×1 IMPLANT
FORCEPS PROGRASP DVNC XI (FORCEP) ×1 IMPLANT
GLOVE ORTHO TXT STRL SZ7.5 (GLOVE) ×2 IMPLANT
GLOVE SURG SYN 6.5 ES PF (GLOVE) ×2 IMPLANT
GLOVE SURG SYN 6.5 PF PI (GLOVE) IMPLANT
GLOVE SURG SYN 7.0 (GLOVE) ×2 IMPLANT
GLOVE SURG SYN 7.0 PF PI (GLOVE) IMPLANT
GOWN STRL REUS W/ TWL LRG LVL3 (GOWN DISPOSABLE) ×2 IMPLANT
GOWN STRL REUS W/ TWL XL LVL3 (GOWN DISPOSABLE) ×2 IMPLANT
GRASPER SUT TROCAR 14GX15 (MISCELLANEOUS) ×1 IMPLANT
IRRIGATION STRYKERFLOW (MISCELLANEOUS) IMPLANT
IRRIGATOR STRYKERFLOW (MISCELLANEOUS) IMPLANT
IRRIGATOR SUCT 8 DISP DVNC XI (IRRIGATION / IRRIGATOR) IMPLANT
KIT PINK PAD W/HEAD ARE REST (MISCELLANEOUS) ×1 IMPLANT
KIT PINK PAD W/HEAD ARM REST (MISCELLANEOUS) ×1 IMPLANT
KIT TURNOVER KIT A (KITS) ×1 IMPLANT
LABEL OR SOLS (LABEL) ×1 IMPLANT
MANIFOLD NEPTUNE II (INSTRUMENTS) ×1 IMPLANT
NDL HYPO 22X1.5 SAFETY MO (MISCELLANEOUS) ×1 IMPLANT
NDL INSUFFLATION 14GA 120MM (NEEDLE) ×1 IMPLANT
NEEDLE HYPO 22X1.5 SAFETY MO (MISCELLANEOUS) ×1 IMPLANT
NEEDLE INSUFFLATION 14GA 120MM (NEEDLE) ×1 IMPLANT
NS IRRIG 500ML POUR BTL (IV SOLUTION) ×1 IMPLANT
PACK LAP CHOLECYSTECTOMY (MISCELLANEOUS) ×1 IMPLANT
SCISSORS MNPLR CVD DVNC XI (INSTRUMENTS) ×1 IMPLANT
SEAL UNIV 5-12 XI (MISCELLANEOUS) ×4 IMPLANT
SET TUBE SMOKE EVAC HIGH FLOW (TUBING) ×1 IMPLANT
SOL .9 NS 3000ML IRR UROMATIC (IV SOLUTION) IMPLANT
SOL ELECTROSURG ANTI STICK (MISCELLANEOUS) ×1 IMPLANT
SOLUTION ELECTROSURG ANTI STCK (MISCELLANEOUS) ×1 IMPLANT
SPIKE FLUID TRANSFER (MISCELLANEOUS) ×1 IMPLANT
SUT MNCRL 4-0 27 PS-2 XMFL (SUTURE) ×1 IMPLANT
SUT VICRYL 0 UR6 27IN ABS (SUTURE) ×1 IMPLANT
SUTURE MNCRL 4-0 27XMF (SUTURE) ×1 IMPLANT
SYR TOOMEY IRRIG 70ML (MISCELLANEOUS) IMPLANT
SYRINGE TOOMEY IRRIG 70ML (MISCELLANEOUS) IMPLANT
SYS BAG RETRIEVAL 10MM (BASKET) ×1 IMPLANT
SYSTEM BAG RETRIEVAL 10MM (BASKET) ×1 IMPLANT
TRAP FLUID SMOKE EVACUATOR (MISCELLANEOUS) ×1 IMPLANT
TROCAR Z-THREAD FIOS 11X100 BL (TROCAR) ×1 IMPLANT
WATER STERILE IRR 500ML POUR (IV SOLUTION) ×1 IMPLANT

## 2023-12-21 NOTE — Interval H&P Note (Signed)
 History and Physical Interval Note:  12/21/2023 8:23 AM  Christine Manning  has presented today for surgery, with the diagnosis of Symptomatic cholelithiasis.  The various methods of treatment have been discussed with the patient and family. After consideration of risks, benefits and other options for treatment, the patient has consented to  Procedure(s) with comments: CHOLECYSTECTOMY, ROBOT-ASSISTED, LAPAROSCOPIC (N/A) - ICG as a surgical intervention.  The patient's history has been reviewed, patient examined, no change in status, stable for surgery.  I have reviewed the patient's chart and labs.  Questions were answered to the patient's satisfaction.     [name]

## 2023-12-21 NOTE — Transfer of Care (Signed)
 Immediate Anesthesia Transfer of Care Note  Patient: Christine Manning  Procedure(s) Performed: CHOLECYSTECTOMY, ROBOT-ASSISTED, LAPAROSCOPIC (Abdomen)  Patient Location: PACU  Anesthesia Type:General  Level of Consciousness: drowsy  Airway & Oxygen Therapy: Patient Spontanous Breathing  Post-op Assessment: Report given to RN and Post -op Vital signs reviewed and stable  Post vital signs: Reviewed and stable  Last Vitals:  Vitals Value Taken Time  BP 99/59 12/21/23 0949  Temp    Pulse 78 12/21/23 0950  Resp 16 12/21/23 0950  SpO2 95 % 12/21/23 0950  Vitals shown include unfiled device data.  Last Pain:  Vitals:   12/21/23 0708  PainSc: 0-No pain         Complications: No notable events documented.

## 2023-12-21 NOTE — Op Note (Signed)
 Robotic cholecystectomy with Indocyamine Green Ductal Imaging.   Pre-operative Diagnosis: Chronic calculus cholecystitis  Post-operative Diagnosis:  Same.  Procedure: Robotic assisted laparoscopic cholecystectomy with Indocyamine Green Ductal Imaging.   Surgeon: [name], M.D., FACS  Anesthesia: General. with endotracheal tube  Findings: as expected.   Estimated Blood Loss: 5 mL         Drains: None         Specimens: Gallbladder           Complications: none  Procedure Details  The patient was seen again in the Holding Room.  1.25 mg dose of ICG was administered intravenously.   The benefits, complications, treatment options, risks and expected outcomes were again reviewed with the patient. The likelihood of improving the patient's symptoms with return to their baseline status is good.  The patient and/or family concurred with the proposed plan, giving informed consent, again alternatives reviewed.  The patient was taken to Operating Room, identified, and the procedure verified as robotic assisted laparoscopic cholecystectomy.  Prior to the induction of general anesthesia, antibiotic prophylaxis was administered. VTE prophylaxis was in place. General endotracheal anesthesia was then administered and tolerated well. The patient was positioned in the supine position.  After the induction, the abdomen was prepped with Chloraprep and draped in the sterile fashion.  A Time Out was held and the above information confirmed.  After local infiltration of quarter percent Marcaine with epinephrine, stab incision was made left upper quadrant.  Just below the costal margin at Palmer's point, approximately midclavicular line the Veres needle is passed with sensation of the layers to penetrate the abdominal wall and into the peritoneum.  Saline drop test is confirmed peritoneal placement.  Insufflation is initiated with carbon dioxide to pressures of 15 mmHg.  Local infiltration with 0.25%  Marcaine with epinephrine is utilized for all skin incisions.  Made a 12 mm incision on the right periumbilical site, I advanced an optical 11mm port under direct visualization into the peritoneal cavity.  Once the peritoneum was penetrated, insufflation was initiated.  The trocar was then advanced into the abdominal cavity under direct visualization. Pneumoperitoneum was then continued utilizing CO2 at 15 mmHg or less and tolerated well without any adverse changes in the patient's vital signs.  Two 8.5-mm ports were placed in the left lower quadrant and laterally, and one to the right lower quadrant, all under direct vision. Local infiltration with a mixture of Exparel and 0.25% Marcaine with epinephrine is utilized for all port sites with deep infiltration under visualization.   The patient was positioned  in reverse Trendelenburg, tilted the patient's left side down.  Da Vinci XI robot was then positioned on to the patient's left side, and docked.  The gallbladder was identified, the fundus grasped via the arm 4 Prograsp and retracted cephalad. Adhesions were lysed with scissors and cautery.  The infundibulum was identified grasped and retracted laterally, exposing the peritoneum overlying the triangle of Calot. This was then opened and dissected using cautery & scissors.  We did achieve the critical view of safety with 2 and only 2 structures entering the gallbladder and we were able to see the liver plate posterior to this from both sides;with the extended critical view of the cystic duct and cystic artery obtained, aided by the ICG via FireFly which improved localization of the major ductal anatomy.    The cystic duct was clearly identified and dissected to isolation.   Artery well isolated and clipped, and the cystic duct was  triple clipped and divided with scissors, as close to the gallbladder neck as feasible, thus leaving two on the remaining stump.  The specimen side of the artery is sealed with  bipolar and divided with monopolar scissors.   The gallbladder was taken from the gallbladder fossa in a retrograde fashion with the electrocautery. The gallbladder was removed and placed in an Endocatch bag.  The liver bed is inspected. Hemostasis was confirmed.  The robot was undocked and moved away from the operative field. No irrigation was utilized. The gallbladder and Endocatch sac were then removed through the paraumbilical port site.   Inspection of the right upper quadrant was performed. No bleeding, bile duct injury or leak, or bowel injury was noted. The infra-umbilical port site fascia was closed with interrumpted 0 Vicryl sutures using PMI/cone under direct visualization. Pneumoperitoneum was released and ports removed.  4-0 subcuticular Monocryl was used to close the skin. Dermabond was  applied.  The patient was then extubated and brought to the recovery room in stable condition. Sponge, lap, and needle counts were correct at closure and at the conclusion of the case.               [name], M.D., [facility] 12/21/2023 9:52 AM

## 2023-12-21 NOTE — Anesthesia Preprocedure Evaluation (Signed)
 Anesthesia Evaluation  Patient identified by MRN, date of birth, ID band Patient awake    Reviewed: Allergy & Precautions, H&P , NPO status , Patient's Chart, lab work & pertinent test results, reviewed documented beta blocker date and time   History of Anesthesia Complications (+) PONV, Family history of anesthesia reaction and history of anesthetic complications  Airway Mallampati: I  TM Distance: >3 FB Neck ROM: full    Dental  (+) Dental Advidsory Given, Caps, Missing, Teeth Intact   Pulmonary neg shortness of breath, asthma , neg COPD, Recent URI , Resolved   Pulmonary exam normal breath sounds clear to auscultation       Cardiovascular Exercise Tolerance: Good hypertension, (-) angina (-) Past MI and (-) Cardiac Stents Normal cardiovascular exam(-) dysrhythmias + Valvular Problems/Murmurs  Rhythm:regular Rate:Normal     Neuro/Psych  PSYCHIATRIC DISORDERS Anxiety     negative neurological ROS     GI/Hepatic negative GI ROS, Neg liver ROS,,,  Endo/Other  diabetes, Type 2, Insulin DependentHypothyroidism    Renal/GU negative Renal ROS  negative genitourinary   Musculoskeletal   Abdominal   Peds  Hematology negative hematology ROS (+)   Anesthesia Other Findings Past Medical History: No date: Asthma No date: B12 deficiency No date: Colon polyp No date: DDD (degenerative disc disease) No date: Family history of adverse reaction to anesthesia     Comment:  sister with post op nausea No date: Heart murmur 2023: History of deep vein thrombosis (DVT) of lower extremity     Comment:  right No date: History of Helicobacter infection No date: History of radioactive iodine thyroid ablation No date: Hyperlipidemia No date: Hypertension No date: Hyperthyroidism No date: Hypothyroidism No date: Osteoporosis without current pathological fracture 1979: Personal history of DVT (deep vein thrombosis)     Comment:  right  leg during pregnancy No date: PONV (postoperative nausea and vomiting) No date: Primary osteoarthritis of left knee No date: Pulmonary nodules No date: Thrombophilia (HCC) No date: Thyroid nodule No date: Traveling blood clot in leg (HCC) No date: Type 2 diabetes mellitus with microalbuminuria, 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) No date: Vitamin D deficiency   Reproductive/Obstetrics negative OB ROS                             Anesthesia Physical Anesthesia Plan  ASA: 3  Anesthesia Plan: General   Post-op Pain Management:    Induction: Intravenous  PONV Risk Score and Plan: 4 or greater and Propofol infusion, TIVA, Treatment may vary due to age or medical condition, Ondansetron and Dexamethasone  Airway Management Planned: Oral ETT  Additional Equipment:   Intra-op Plan:   Post-operative Plan: Extubation in OR  Informed Consent: I have reviewed the patients History and Physical, chart, labs and discussed the procedure including the risks, benefits and alternatives for the proposed anesthesia with the patient or authorized representative who has indicated his/her understanding and acceptance.     Dental Advisory Given  Plan Discussed with: Anesthesiologist, CRNA and Surgeon  Anesthesia Plan Comments:         Anesthesia Quick Evaluation

## 2023-12-21 NOTE — Anesthesia Procedure Notes (Signed)
 Procedure Name: Intubation Date/Time: 12/21/2023 8:45 AM  Performed by: Cheral Bay, CRNAPre-anesthesia Checklist: Patient identified, Emergency Drugs available, Suction available and Patient being monitored Patient Re-evaluated:Patient Re-evaluated prior to induction Oxygen Delivery Method: Circle system utilized Preoxygenation: Pre-oxygenation with 100% oxygen Induction Type: IV induction Ventilation: Mask ventilation without difficulty Laryngoscope Size: McGrath and 3 Grade View: Grade I Tube type: Oral Tube size: 6.5 mm Number of attempts: 1 Airway Equipment and Method: Stylet Placement Confirmation: ETT inserted through vocal cords under direct vision, positive ETCO2 and breath sounds checked- equal and bilateral Secured at: 20 cm Tube secured with: Tape Dental Injury: Teeth and Oropharynx as per pre-operative assessment

## 2023-12-22 LAB — SURGICAL PATHOLOGY

## 2023-12-22 NOTE — Anesthesia Postprocedure Evaluation (Signed)
 Anesthesia Post Note  Patient: Christine Manning  Procedure(s) Performed: CHOLECYSTECTOMY, ROBOT-ASSISTED, LAPAROSCOPIC (Abdomen)  Patient location during evaluation: PACU Anesthesia Type: General Level of consciousness: awake and alert Pain management: pain level controlled Vital Signs Assessment: post-procedure vital signs reviewed and stable Respiratory status: spontaneous breathing, nonlabored ventilation, respiratory function stable and patient connected to nasal cannula oxygen Cardiovascular status: blood pressure returned to baseline and stable Postop Assessment: no apparent nausea or vomiting Anesthetic complications: no   No notable events documented.   Last Vitals:  Vitals:   12/21/23 1100 12/21/23 1130  BP: 103/63 (!) 113/98  Pulse: (!) 57 63  Resp: 19 17  Temp: (!) 36.1 C (!) 36.1 C  SpO2: 99% 97%    Last Pain:  Vitals:   12/21/23 1130  PainSc: 0-No pain                 Lenard Simmer

## 2023-12-30 ENCOUNTER — Other Ambulatory Visit

## 2024-01-02 ENCOUNTER — Encounter (INDEPENDENT_AMBULATORY_CARE_PROVIDER_SITE_OTHER): Payer: Self-pay | Admitting: Nurse Practitioner

## 2024-01-02 ENCOUNTER — Ambulatory Visit (INDEPENDENT_AMBULATORY_CARE_PROVIDER_SITE_OTHER): Admitting: Nurse Practitioner

## 2024-01-02 VITALS — BP 97/63 | HR 71 | Resp 18 | Ht 59.0 in | Wt 124.0 lb

## 2024-01-02 DIAGNOSIS — I83811 Varicose veins of right lower extremities with pain: Secondary | ICD-10-CM | POA: Diagnosis not present

## 2024-01-02 DIAGNOSIS — R002 Palpitations: Secondary | ICD-10-CM

## 2024-01-02 DIAGNOSIS — M5441 Lumbago with sciatica, right side: Secondary | ICD-10-CM | POA: Diagnosis not present

## 2024-01-02 DIAGNOSIS — G8929 Other chronic pain: Secondary | ICD-10-CM

## 2024-01-02 DIAGNOSIS — I739 Peripheral vascular disease, unspecified: Secondary | ICD-10-CM

## 2024-01-02 MED ORDER — RIVAROXABAN 10 MG PO TABS: 10.0000 mg | ORAL_TABLET | Freq: Every day | ORAL | 3 refills | Status: AC

## 2024-01-03 NOTE — Progress Notes (Signed)
 Subjective:    Patient ID: Christine Manning, female    DOB: [birth year], 78 y.o.   MRN: [id number] Chief Complaint  Patient presents with   New Patient (Initial Visit)    np. consult. History of DVT  ref: Dana Allan    The patient is a 78 year old female who presents today as a referral from Dr. Clent Ridges in regards to her history of DVT.  The patient has had multiple DVTs in the past and it has been determined that she is hypercoagulable.  She remains on low-dose Xarelto and has been tolerating this well.  Unfortunately she relocated recently and because of that her Xarelto lapsed for short amount of time.  She denies any new or developing symptoms that may be consistent with a DVT.  However she does endorse having pain in her right lower extremity.  She notes that shooting pain from her hip down her leg as well as discomfort while walking as well.  The symptoms sound like they are claudication in nature.  She denies any issues with her left lower extremity all of her pain and discomfort is in her right.  She previously had noninvasive studies done by vascular specialist to follow at her neurology.  At the time it was shown that she has significant reflux in her right great saphenous vein.  In addition she had abnormal ABIs on the right lower extremity.    Review of Systems  Cardiovascular:  Negative for leg swelling.  Musculoskeletal:  Positive for arthralgias and back pain.  All other systems reviewed and are negative.      Objective:   Physical Exam Vitals reviewed.  HENT:     Head: Normocephalic.  Cardiovascular:     Rate and Rhythm: Normal rate.     Pulses:          Dorsalis pedis pulses are 1+ on the right side.  Pulmonary:     Effort: Pulmonary effort is normal.  Skin:    General: Skin is warm and dry.  Neurological:     Mental Status: She is alert and oriented to person, place, and time.  Psychiatric:        Mood and Affect: Mood normal.        Behavior: Behavior  normal.        Thought Content: Thought content normal.        Judgment: Judgment normal.     BP 97/63   Pulse 71   Resp 18   Ht 4\' 11"  (1.499 m)   Wt 124 lb (56.2 kg)   BMI 25.04 kg/m   Past Medical History:  Diagnosis Date   Asthma    B12 deficiency    Colon polyp    DDD (degenerative disc disease)    Family history of adverse reaction to anesthesia    sister with post op nausea   Heart murmur    History of deep vein thrombosis (DVT) of lower extremity 2023   right   History of Helicobacter infection    History of radioactive iodine thyroid ablation    Hyperlipidemia    Hypertension    Hyperthyroidism    Hypothyroidism    Osteoporosis without current pathological fracture    Personal history of DVT (deep vein thrombosis) 1979   right leg during pregnancy   PONV (postoperative nausea and vomiting)    Primary osteoarthritis of left knee    Pulmonary nodules    Thrombophilia (HCC)    Thyroid nodule    Traveling  blood clot in leg (HCC)    Type 2 diabetes mellitus with microalbuminuria, without long-term current use of insulin (HCC)    Vitamin D deficiency     Social History   Socioeconomic History   Marital status: Legally Separated    Spouse name: Not on file   Number of children: 3   Years of education: Not on file   Highest education level: Not on file  Occupational History   Occupation: Retired  Tobacco Use   Smoking status: Never   Smokeless tobacco: Never  Vaping Use   Vaping status: Never Used  Substance and Sexual Activity   Alcohol use: No   Drug use: No   Sexual activity: Not Currently  Other Topics Concern   Not on file  Social History Narrative   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    Social Drivers of Corporate investment banker Strain: Low Risk  (01/08/2022)   Received from [facility]   Overall Financial Resource Strain (CARDIA)    Difficulty of Paying Living Expenses: Not hard at all  Food Insecurity: No Food  Insecurity (01/08/2022)   Received from [facility]   Hunger Vital Sign    Worried About Running Out of Food in the Last Year: Never true    Ran Out of Food in the Last Year: Never true  Transportation Needs: No Transportation Needs (01/08/2022)   Received from [facility] - Transportation    Lack of Transportation (Medical): No    Lack of Transportation (Non-Medical): No  Physical Activity: Not on file  Stress: Not on file  Social Connections: Not on file  Intimate Partner Violence: Not At Risk (07/20/2018)   Received from [facility]   Humiliation, Afraid, Rape, and Kick questionnaire    Fear of Current or Ex-Partner: No    Emotionally Abused: No    Physically Abused: No    Sexually Abused: No    Past Surgical History:  Procedure Laterality Date   ABDOMINAL HYSTERECTOMY  09/20/1989   secondary to abnormal PAPs   ANAL RECTAL MANOMETRY N/A 01/24/2017   Procedure: ANO RECTAL MANOMETRY;  Surgeon: Sergio Dandy, MD;  Location: [facility];  Service: Endoscopy;  Laterality: N/A;   BLADDER SUSPENSION  09/20/2001   BREAST BIOPSY Right 1984   neg   CESAREAN SECTION  1979   COLONOSCOPY  09/20/2014   COLONOSCOPY  11/29/2019   CORONARY ANGIOPLASTY  09/21/2011   arteries were clean .  Dr. Garland Junk at [facility]    KNEE ARTHROSCOPY Left    THYROIDECTOMY, PARTIAL Right 02/01/2019   TONSILLECTOMY     TOTAL KNEE ARTHROPLASTY Left 07/08/2021   TUBAL LIGATION      Family History  Problem Relation Age of Onset   Mental illness Mother        multi infarct dementia   Stroke Mother    Asthma Mother    Mental illness Father        alzheimers dementia   Asthma Brother    Hernia Brother    Asthma Son    Colon cancer Maternal Aunt 21   Breast cancer Maternal Aunt 65   Uterine cancer Maternal Grandmother    Colon polyps Sister    Murrell Arrant' disease Sister    Asthma Daughter    Colon cancer Cousin        40's   Diabetes Neg Hx    Kidney disease Neg Hx    Esophageal  cancer Neg Hx     Allergies  Allergen Reactions   Codeine Hives and Shortness Of Breath   Latex Hives and Swelling   Augmentin [Amoxicillin-Pot Clavulanate] Other (See Comments)    Causes IBS   Morphine Nausea Only   Morphine And Codeine Nausea And Vomiting       Latest Ref Rng & Units 11/10/2023    3:10 PM 12/22/2017    8:26 AM 11/08/2017    1:49 PM  CBC  WBC 4.0 - 10.5 K/uL 4.9  5.7  4.4   Hemoglobin 12.0 - 15.0 g/dL 32.4  40.1  02.7   Hematocrit 36.0 - 46.0 % 35.9  32.3  32.1   Platelets 150.0 - 400.0 K/uL 292.0  323.0  385.0       CMP     Component Value Date/Time   NA 141 12/13/2023 1025   NA 142 03/13/2013 1132   K 3.9 12/13/2023 1025   K 4.1 03/13/2013 1132   CL 107 12/13/2023 1025   CL 108 (H) 03/13/2013 1132   CO2 26 12/13/2023 1025   CO2 28 03/13/2013 1132   GLUCOSE 92 12/13/2023 1025   GLUCOSE 101 (H) 03/13/2013 1132   BUN 19 12/13/2023 1025   BUN 12 03/13/2013 1132   CREATININE 1.11 (H) 12/13/2023 1025   CREATININE 0.91 03/13/2013 1132   CALCIUM 9.1 12/13/2023 1025   CALCIUM 9.1 03/13/2013 1132   PROT 7.5 11/10/2023 1510   PROT 8.2 03/13/2013 1132   ALBUMIN 4.3 11/10/2023 1510   ALBUMIN 3.9 03/13/2013 1132   AST 16 11/10/2023 1510   AST 14 (L) 03/13/2013 1132   ALT 8 11/10/2023 1510   ALT 17 03/13/2013 1132   ALKPHOS 46 11/10/2023 1510   ALKPHOS 78 03/13/2013 1132   BILITOT 0.6 11/10/2023 1510   BILITOT 0.3 03/13/2013 1132   GFR 43.21 (L) 11/10/2023 1510   GFRNONAA 52 (L) 12/13/2023 1025   GFRNONAA >60 03/13/2013 1132     No results found.     Assessment & Plan:   1. 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  However it was also noted that studies about 2 years ago the patient had significant venous reflux in the right lower extremity and so her discomfort could be from varicose  veins as well.  2. PAD (peripheral artery disease) (HCC) The patient's previous studies about 2 years ago had a diminished ABI.  Some of her symptoms are concerning for claudication but she also has lower back issues as noted below.  Will have patient return for repeat ABI to stratify and this may be the source of her pain as well.  3. Chronic bilateral low back pain with right-sided sciatica This is also another possible cause of her lower extremity discomfort  4. Palpitations The patient was previously established with a cardiologist in [place].  Due to her relocation she needs a cardiologist that is local for her.  Will plan on referring her to cardiologist for evaluation - Ambulatory referral to Cardiology   Current Outpatient Medications on File Prior to Visit  Medication Sig Dispense Refill   acetaminophen (TYLENOL) 650 MG CR tablet Take 1,300 mg by mouth as needed.     albuterol (VENTOLIN HFA) 108 (90 Base) MCG/ACT inhaler Inhale 2 puffs into the lungs every 4 (four) hours as needed for wheezing  or shortness of breath. 17 each 0   ascorbic acid (VITAMIN C) 1000 MG tablet Take 1 tablet by mouth daily.     atorvastatin (LIPITOR) 80 MG tablet Take 80 mg by mouth at bedtime.     benzonatate (TESSALON) 100 MG capsule Take 1 capsule (100 mg total) by mouth every 8 (eight) hours. (Patient taking differently: Take 100 mg by mouth 3 (three) times daily as needed for cough.) 21 capsule 0   Blood Glucose Monitoring Suppl DEVI 1 each by Does not apply route in the morning, at noon, and at bedtime. May substitute to any manufacturer covered by patient's insurance. 1 each 0   Cholecalciferol (VITAMIN D-1000 MAX ST) 25 MCG (1000 UT) tablet Take 1,000 Units by mouth daily.     cyanocobalamin (VITAMIN B12) 1000 MCG/ML injection Inject 1,000 mcg into the muscle every 30 (thirty) days.     gabapentin (NEURONTIN) 300 MG capsule Take 1 capsule (300 mg total) by mouth 3 (three) times daily as needed.  (Patient taking differently: Take 300 mg by mouth at bedtime.) 90 capsule 5   hydrochlorothiazide (HYDRODIURIL) 12.5 MG tablet Take 12.5 mg by mouth daily.     HYDROcodone-acetaminophen (NORCO/VICODIN) 5-325 MG tablet Take 1 tablet by mouth every 6 (six) hours as needed for moderate pain (pain score 4-6). 15 tablet 0   ibuprofen (ADVIL) 800 MG tablet Take 1 tablet (800 mg total) by mouth every 8 (eight) hours as needed. 30 tablet 0   ipratropium-albuterol (DUONEB) 0.5-2.5 (3) MG/3ML SOLN Inhale 3 mLs into the lungs every 6 (six) hours as needed.     levothyroxine (SYNTHROID) 50 MCG tablet Take 1 tablet (50 mcg total) by mouth daily. 90 tablet 3   lisinopril (ZESTRIL) 20 MG tablet Take 20 mg by mouth daily.     loratadine (CLARITIN) 10 MG tablet Take 1 tablet (10 mg total) by mouth daily. 30 tablet 0   MAGNESIUM PO Take 1 tablet by mouth daily.     metFORMIN (GLUCOPHAGE) 500 MG tablet Take 500 mg by mouth daily.     mometasone (NASONEX) 50 MCG/ACT nasal spray Place 2 sprays into the nose daily.     Multiple Vitamin (MULTIVITAMIN) tablet Take 1 tablet by mouth daily.     ondansetron (ZOFRAN) 4 MG tablet Take 1 tablet (4 mg total) by mouth every 8 (eight) hours as needed for nausea or vomiting. 20 tablet 0   ondansetron (ZOFRAN-ODT) 8 MG disintegrating tablet Take 8 mg by mouth as needed.     triamcinolone cream (KENALOG) 0.1 % Apply 1 Application topically 2 (two) times daily as needed (irritation).     Vitamin D, Ergocalciferol, (DRISDOL) 1.25 MG (50000 UNIT) CAPS capsule Take 1 capsule (50,000 Units total) by mouth every 7 (seven) days. 12 capsule 1   No current facility-administered medications on file prior to visit.    There are no Patient Instructions on file for this visit. No follow-ups on file.   Eathel Pajak E Mat Stuard, NP

## 2024-01-04 NOTE — Progress Notes (Unsigned)
 [facility] POST-OP OFFICE VISIT  01/05/2024  HPI: Christine Manning is a 78 y.o. female had surgery on December 21, 2023, now s/p robotic cholecystectomy.  Path showed chronic calculus cholecystitis.  She reports having some loose stools and some sporadic bloating.  Had a learning her way around what she could eat and cannot eat.  Denies any significant pain.  Biggest concern was some episodes of palpitations recently and is currently has a cardiology consultation pending.  Vital signs: BP (!) 96/57   Pulse 87   Temp 98.1 F (36.7 C)   Ht 4\' 11"  (1.499 m)   Wt 123 lb (55.8 kg)   SpO2 100%   BMI 24.84 kg/m    Physical Exam: Constitutional: She looks good, well recovered. Heart rate, within normal limits. Abdomen: Soft benign nontender. Skin: All incisions are clean dry and intact, Dermabond is dry pristine and flaking off nicely.  Assessment/Plan: This is a 78 y.o. female status post robotic cholecystectomy.  Patient Active Problem List   Diagnosis Date Noted   CCC (chronic calculous cholecystitis) 12/02/2023   Postmenopausal estrogen deficiency 11/17/2023   Decreased GFR 11/17/2023   Iron deficiency anemia 11/16/2023   Vitamin B 12 deficiency 11/16/2023   Type 2 diabetes mellitus with complications (HCC) 11/16/2023   Breast cancer screening by mammogram 11/16/2023   Need for hepatitis C screening test 11/16/2023   Postthrombotic syndrome 11/16/2023   Hypercoagulable state, secondary (HCC) 11/16/2023   History of Helicobacter pylori infection 06/09/2023   Low iron 03/16/2023   Chronic bilateral low back pain with right-sided sciatica 11/11/2022   Chronic pain of both shoulders 11/11/2022   Palpitations 04/01/2022   Shortness of breath 04/01/2022   RUQ abdominal pain 01/28/2022   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   Primary osteoarthritis of left knee 06/11/2021   Anticoagulated 03/16/2021   Mild intermittent asthma without complication 03/16/2021    Pulmonary nodules 03/16/2021   Microscopic hematuria 11/19/2020   Medical home patient encounter 07/08/2019   Ingrown toenail 03/12/2019   Thrombophilia (HCC) 03/12/2019   Advanced directives, counseling/discussion 03/12/2019   BMI 29.0-29.9,adult 03/12/2019   Osteoporosis without current pathological fracture 03/12/2019   Stress incontinence, female 03/12/2019   H/O partial thyroidectomy 02/16/2019   History of radioactive iodine thyroid ablation 05/23/2018   Lumbago with sciatica, right side 12/27/2017   Chronic midline posterior neck pain 12/27/2017   Hyperlipidemia associated with type 2 diabetes mellitus (HCC) 09/26/2017   Hypertension associated with diabetes (HCC) 09/26/2017   History of DVT (deep vein thrombosis) 09/26/2017   Nocturnal cough 09/19/2017   Herpes zoster 05/10/2017   Type 2 diabetes mellitus with microalbuminuria, without long-term current use of insulin (HCC) 03/20/2017   Incontinence of feces 01/14/2017   Normocytic anemia 09/15/2016   B12 deficiency 11/13/2015   Encounter for preventive health examination 09/11/2015   Tubular adenoma of colon 09/10/2015   History of bladder suspension procedure 09/08/2014   SI (stress incontinence), female 09/08/2014   Insomnia 06/18/2014   Vitamin D deficiency 08/28/2013   Overweight (BMI 25.0-29.9) 08/14/2013   S/P hysterectomy with oophorectomy 08/13/2013   History of thyroid nodule 08/13/2013   Encounter for Medicare annual wellness exam 08/13/2013   Osteoarthritis of left knee 07/16/2013   Degenerative disk disease 07/16/2013   Personal history of DVT (deep vein thrombosis)    Anxiety 10/11/2012   Irritable bowel syndrome 10/11/2012   Postmenopausal osteoporosis 10/11/2012   Asthma    Hypothyroidism     - May follow-up with [place]  as needed.  Anticipating her IBS to settle with dietary adjustments over time.  Currently improving.   Flynn Hylan M.D., FACS 01/05/2024, 11:04 AM

## 2024-01-05 ENCOUNTER — Encounter: Payer: Self-pay | Admitting: Surgery

## 2024-01-05 ENCOUNTER — Ambulatory Visit (INDEPENDENT_AMBULATORY_CARE_PROVIDER_SITE_OTHER): Admitting: Surgery

## 2024-01-05 VITALS — BP 96/57 | HR 87 | Temp 98.1°F | Ht 59.0 in | Wt 123.0 lb

## 2024-01-05 DIAGNOSIS — Z09 Encounter for follow-up examination after completed treatment for conditions other than malignant neoplasm: Secondary | ICD-10-CM

## 2024-01-05 DIAGNOSIS — Z9049 Acquired absence of other specified parts of digestive tract: Secondary | ICD-10-CM | POA: Insufficient documentation

## 2024-01-05 DIAGNOSIS — K8010 Calculus of gallbladder with chronic cholecystitis without obstruction: Secondary | ICD-10-CM

## 2024-01-05 NOTE — Patient Instructions (Addendum)
GENERAL POST-OPERATIVE  PATIENT INSTRUCTIONS   WOUND CARE INSTRUCTIONS: If the wound becomes bright red and painful or starts to drain infected material that is not clear, please contact your physician immediately.  If the wound is mildly pink and has a thick firm ridge underneath it, this is normal, and is referred to as a healing ridge.  This will resolve over the next 4-6 weeks.  BATHING: You may shower if you have been informed of this by your surgeon. However, Please do not submerge in a tub, hot tub, or pool until incisions are completely sealed or have been told by your surgeon that you may do so.  DIET:  You may eat any foods that you can tolerate.  It is a good idea to eat a high fiber diet and take in plenty of fluids to prevent constipation.  If you do become constipated you may want to take a mild laxative or take ducolax tablets on a daily basis until your bowel habits are regular.  Constipation can be very uncomfortable, along with straining, after recent surgery.  ACTIVITY: You are encouraged to walk and engage in light activity for the next two weeks.  You should not lift more than 20 pounds for 6 weeks total after surgery as it could put you at increased risk for complications. Twenty pounds is roughly equivalent to a plastic bag of groceries. At that time- Listen to your body when lifting, if you have pain when lifting, stop and then try again in a few days. Soreness after doing exercises or activities of daily living is normal as you get back in to your normal routine.  MEDICATIONS:  Try to take narcotic medications and anti-inflammatory medications, such as tylenol, ibuprofen, naprosyn, etc., with food.  This will minimize stomach upset from the medication.  Should you develop nausea and vomiting from the pain medication, or develop a rash, please discontinue the medication and contact your physician.  You should not drive, make important decisions, or operate machinery when taking  narcotic pain medication.  SUNBLOCK Use sun block to incision area over the next year if this area will be exposed to sun. This helps decrease scarring and will allow you avoid a permanent darkened area over your incision.  QUESTIONS:  Please feel free to call our office if you have any questions, and we will be glad to assist you. [phone]  

## 2024-01-09 ENCOUNTER — Other Ambulatory Visit (INDEPENDENT_AMBULATORY_CARE_PROVIDER_SITE_OTHER)

## 2024-01-09 DIAGNOSIS — E890 Postprocedural hypothyroidism: Secondary | ICD-10-CM

## 2024-01-09 DIAGNOSIS — R944 Abnormal results of kidney function studies: Secondary | ICD-10-CM

## 2024-01-09 LAB — COMPREHENSIVE METABOLIC PANEL WITH GFR
ALT: 10 U/L (ref 0–35)
AST: 18 U/L (ref 0–37)
Albumin: 4.4 g/dL (ref 3.5–5.2)
Alkaline Phosphatase: 51 U/L (ref 39–117)
BUN: 22 mg/dL (ref 6–23)
CO2: 28 meq/L (ref 19–32)
Calcium: 9.5 mg/dL (ref 8.4–10.5)
Chloride: 104 meq/L (ref 96–112)
Creatinine, Ser: 1.33 mg/dL — ABNORMAL HIGH (ref 0.40–1.20)
GFR: 38.91 mL/min — ABNORMAL LOW (ref >60.00)
Glucose, Bld: 92 mg/dL (ref 70–99)
Potassium: 4 meq/L (ref 3.5–5.1)
Sodium: 142 meq/L (ref 135–145)
Total Bilirubin: 0.6 mg/dL (ref 0.2–1.2)
Total Protein: 7.3 g/dL (ref 6.0–8.3)

## 2024-01-09 LAB — TSH: TSH: 0.4 u[IU]/mL (ref 0.35–5.50)

## 2024-02-06 DIAGNOSIS — E039 Hypothyroidism, unspecified: Secondary | ICD-10-CM | POA: Diagnosis not present

## 2024-02-06 DIAGNOSIS — E1122 Type 2 diabetes mellitus with diabetic chronic kidney disease: Secondary | ICD-10-CM | POA: Diagnosis not present

## 2024-02-06 DIAGNOSIS — Z1331 Encounter for screening for depression: Secondary | ICD-10-CM | POA: Diagnosis not present

## 2024-02-06 DIAGNOSIS — N1831 Chronic kidney disease, stage 3a: Secondary | ICD-10-CM | POA: Diagnosis not present

## 2024-02-07 ENCOUNTER — Encounter (INDEPENDENT_AMBULATORY_CARE_PROVIDER_SITE_OTHER): Payer: Self-pay

## 2024-02-10 ENCOUNTER — Other Ambulatory Visit (INDEPENDENT_AMBULATORY_CARE_PROVIDER_SITE_OTHER): Payer: Self-pay | Admitting: Nurse Practitioner

## 2024-02-10 DIAGNOSIS — R6889 Other general symptoms and signs: Secondary | ICD-10-CM

## 2024-02-10 DIAGNOSIS — I83811 Varicose veins of right lower extremities with pain: Secondary | ICD-10-CM

## 2024-02-15 ENCOUNTER — Ambulatory Visit (INDEPENDENT_AMBULATORY_CARE_PROVIDER_SITE_OTHER): Admitting: Nurse Practitioner

## 2024-02-15 ENCOUNTER — Encounter (INDEPENDENT_AMBULATORY_CARE_PROVIDER_SITE_OTHER): Payer: Self-pay | Admitting: Nurse Practitioner

## 2024-02-15 ENCOUNTER — Ambulatory Visit (INDEPENDENT_AMBULATORY_CARE_PROVIDER_SITE_OTHER)

## 2024-02-15 VITALS — BP 122/84 | HR 73 | Resp 18 | Ht 59.0 in | Wt 124.0 lb

## 2024-02-15 DIAGNOSIS — I70201 Unspecified atherosclerosis of native arteries of extremities, right leg: Secondary | ICD-10-CM | POA: Diagnosis not present

## 2024-02-15 DIAGNOSIS — G8929 Other chronic pain: Secondary | ICD-10-CM

## 2024-02-15 DIAGNOSIS — M5441 Lumbago with sciatica, right side: Secondary | ICD-10-CM | POA: Diagnosis not present

## 2024-02-15 DIAGNOSIS — I739 Peripheral vascular disease, unspecified: Secondary | ICD-10-CM | POA: Diagnosis not present

## 2024-02-15 DIAGNOSIS — I83811 Varicose veins of right lower extremities with pain: Secondary | ICD-10-CM | POA: Diagnosis not present

## 2024-02-15 DIAGNOSIS — E538 Deficiency of other specified B group vitamins: Secondary | ICD-10-CM | POA: Diagnosis not present

## 2024-02-15 DIAGNOSIS — R6889 Other general symptoms and signs: Secondary | ICD-10-CM

## 2024-02-16 LAB — VAS US ABI WITH/WO TBI
Left ABI: 1.13
Right ABI: 1.12

## 2024-02-20 ENCOUNTER — Encounter (INDEPENDENT_AMBULATORY_CARE_PROVIDER_SITE_OTHER): Payer: Self-pay | Admitting: Nurse Practitioner

## 2024-02-20 NOTE — Progress Notes (Signed)
 Subjective:    Patient ID: Christine Manning, female    DOB: Sep 25, 1945, 78 y.o.   MRN: [id number] Chief Complaint  Patient presents with   Follow-up    Pt conv ABI R Venous Reflux    The patient is a 78 year old female who presents today as a referral from Dr. Sueanne Emerald in regards to her history of DVT.  The patient has had multiple DVTs in the past and it has been determined that she is hypercoagulable.  She remains on low-dose Xarelto  and has been tolerating this well.  Unfortunately she relocated recently and because of that her Xarelto  lapsed for short amount of time.  She denies any new or developing symptoms that may be consistent with a DVT.  However she does endorse having pain in her right lower extremity.  She notes that shooting pain from her hip down her leg as well as discomfort while walking as well.  The symptoms sound like they are claudication in nature.  Initially she denies any pain in her left lower extremity but she notes that since her last visit she has begun to experience pain in her left.  She previously had noninvasive studies done by vascular specialist to follow at her neurology.  At the time it was shown that she has significant reflux in her right great saphenous vein.  In addition she had abnormal ABIs on the right lower extremity.  The patient also has a history of B12 deficiency and is receiving B12 shots however we are not certain when her last shot was.  She also notes that she had a history of restless leg syndrome and was receiving treatment by her previous primary but that was also stopped.  I can find the last shot around November but the patient believes she may have had 1 in February.  Unfortunately the patient has been relocating and is in the process of changing over many of her specialist.  She notes that her current PCP, Dr. Sueanne Emerald is also relocating and will be leaving in June.  Her next visit with her PCP is not until December.  Today she underwent  noninvasive studies which shows a right ABI 1.12 on the left of 1.13.  She has multiphasic waveforms in the bilateral tibial vessels.  Today venous reflux study was only done on the right per the patient's previous discussion.  No evidence of deep venous insufficiency noted.  She does have superficial venous reflux noted throughout the great saphenous vein.    Review of Systems  Cardiovascular:  Negative for leg swelling.  Musculoskeletal:  Positive for arthralgias and back pain.  Neurological:  Positive for numbness.  All other systems reviewed and are negative.      Objective:   Physical Exam Vitals reviewed.  HENT:     Head: Normocephalic.  Cardiovascular:     Rate and Rhythm: Normal rate.     Pulses:          Dorsalis pedis pulses are 1+ on the right side.  Pulmonary:     Effort: Pulmonary effort is normal.  Skin:    General: Skin is warm and dry.  Neurological:     Mental Status: She is alert and oriented to person, place, and time.  Psychiatric:        Mood and Affect: Mood normal.        Behavior: Behavior normal.        Thought Content: Thought content normal.  Judgment: Judgment normal.     BP 122/84   Pulse 73   Resp 18   Ht 4\' 11"  (1.499 m)   Wt 124 lb (56.2 kg)   BMI 25.04 kg/m   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   Colon polyp    DDD (degenerative disc disease)    Family history of adverse reaction to anesthesia    sister with post op nausea   Heart murmur    History of deep vein thrombosis (DVT) of lower extremity 2023   right   History of Helicobacter infection    History of radioactive iodine thyroid  ablation    Hyperlipidemia    Hypertension    Hyperthyroidism    Hypothyroidism    Osteoporosis without current pathological fracture    Personal history of DVT (deep vein thrombosis) 1979   right leg during pregnancy   PONV (postoperative nausea and  vomiting)    Primary osteoarthritis of left knee    Pulmonary nodules    Thrombophilia (HCC)    Thyroid  nodule    Traveling blood clot in leg (HCC)    Type 2 diabetes mellitus with microalbuminuria, without long-term current use of insulin (HCC)    Vitamin D  deficiency     Social History   Socioeconomic History   Marital status: Legally Separated    Spouse name: Not on file   Number of children: 3   Years of education: Not on file   Highest education level: Not on file  Occupational History   Occupation: Retired  Tobacco Use   Smoking status: Never    Passive exposure: Never   Smokeless tobacco: Never  Vaping Use   Vaping status: Never Used  Substance and Sexual Activity   Alcohol use: No   Drug use: No   Sexual activity: Not Currently  Other Topics Concern   Not on file  Social History Narrative   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    Social Drivers of Health   Financial Resource Strain: Medium Risk (02/06/2024)   Received from [facility]   Overall Financial Resource Strain (CARDIA)    Difficulty of Paying Living Expenses: Somewhat hard  Food Insecurity: No Food Insecurity (02/06/2024)   Received from [facility]   Hunger Vital Sign    Worried About Running Out of Food in the Last Year: Never true    Ran Out of Food in the Last Year: Never true  Transportation Needs: Unmet Transportation Needs (02/06/2024)   Received from [facility] - Transportation    In the past 12 months, has lack of transportation kept you from medical appointments or from getting medications?: Yes    Lack of Transportation (Non-Medical): Yes  Physical Activity: Not on file  Stress: Not on file  Social Connections: Not on file  Intimate Partner Violence: Not At Risk (07/20/2018)   Received from [facility]   Humiliation, Afraid, Rape, and Kick questionnaire    Fear of Current or Ex-Partner: No    Emotionally  Abused: No    Physically Abused: No    Sexually Abused: No    Past Surgical History:  Procedure Laterality Date   ABDOMINAL HYSTERECTOMY  09/20/1989   secondary to abnormal PAPs   ANAL RECTAL MANOMETRY N/A 01/24/2017   Procedure: ANO RECTAL MANOMETRY;  Surgeon: Sergio Dandy, MD;  Location: [facility];  Service: Endoscopy;  Laterality: N/A;   BLADDER SUSPENSION  09/20/2001   BREAST BIOPSY Right 1984   neg   CESAREAN SECTION  1979   COLONOSCOPY  09/20/2014   COLONOSCOPY  11/29/2019   CORONARY ANGIOPLASTY  09/21/2011   arteries were clean .  Dr[facility] Garland [facility] at [facility]    KNEE ARTHROSCOPY Left    THYROIDECTOMY, PARTIAL Right 02/01/2019   TONSILLECTOMY     TOTAL KNEE ARTHROPLASTY Left 07/08/2021   TUBAL LIGATION      Family History  Problem Relation Age of Onset   Mental illness Mother        multi infarct dementia   Stroke Mother    Asthma Mother    Mental illness Father        alzheimers dementia   Asthma Brother    Hernia Brother    Asthma Son    Colon cancer Maternal Aunt 61   Breast cancer Maternal Aunt 81   Uterine cancer Maternal Grandmother    Colon polyps Sister    Graves' disease Sister    Asthma Daughter    Colon cancer Cousin        40's   Diabetes Neg Hx    Kidney disease Neg Hx    Esophageal cancer Neg Hx     Allergies  Allergen Reactions   Codeine Hives and Shortness Of Breath   Latex Hives and Swelling   Augmentin [Amoxicillin-Pot Clavulanate] Other (See Comments)    Causes IBS   Morphine Nausea Only   Morphine And Codeine Nausea And Vomiting       Latest Ref Rng & Units 11/10/2023    3:10 PM 12/22/2017    8:26 AM 11/08/2017    1:49 PM  CBC  WBC 4.0 - 10.5 K/uL 4.9  5.7  4.4   Hemoglobin 12.0 - 15.0 g/dL 16.1  09.6  04.5   Hematocrit 36.0 - 46.0 % 35.9  32.3  32.1   Platelets 150.0 - 400.0 K/uL 292.0  323.0  385.0       CMP     Component Value Date/Time   NA 142 01/09/2024 1339   NA 142 03/13/2013 1132   K 4.0 01/09/2024 1339   K  4.1 03/13/2013 1132   CL 104 01/09/2024 1339   CL 108 (H) 03/13/2013 1132   CO2 28 01/09/2024 1339   CO2 28 03/13/2013 1132   GLUCOSE 92 01/09/2024 1339   GLUCOSE 101 (H) 03/13/2013 1132   BUN 22 01/09/2024 1339   BUN 12 03/13/2013 1132   CREATININE 1.33 (H) 01/09/2024 1339   CREATININE 0.91 03/13/2013 1132   CALCIUM 9.5 01/09/2024 1339   CALCIUM 9.1 03/13/2013 1132   PROT 7.3 01/09/2024 1339   PROT 8.2 03/13/2013 1132   ALBUMIN 4.4 01/09/2024 1339   ALBUMIN 3.9 03/13/2013 1132   AST 18 01/09/2024 1339   AST 14 (L) 03/13/2013 1132   ALT 10 01/09/2024 1339   ALT 17 03/13/2013 1132   ALKPHOS 51 01/09/2024 1339   ALKPHOS 78 03/13/2013 1132   BILITOT 0.6 01/09/2024 1339   BILITOT 0.3 03/13/2013 1132   GFR 38.91 (L) 01/09/2024 1339   GFRNONAA 52 (L) 12/13/2023 1025   GFRNONAA >60 03/13/2013 1132     No results found.     Assessment & Plan:   1. Varicose veins of right lower extremity with pain (Primary) The patient does have evidence of significant venous reflux in the right lower extremity.  Some of her  symptoms may be related to reflux but I have a suspicion that they are related to something more neurological in nature as noted below.  Prior to planning any intervention, I feel it would be prudent to evaluate her underlying issues, such as her B12 deficiency and restless leg syndrome, that may be causing much of her symptoms.  2. PAD (peripheral artery disease) (HCC) Today the patient's ABIs are normal with minimally diminished TBI's.  Based on description of symptoms I do not believe she has any significant arterial disease but this is with monitoring on a consistent basis.  3. Chronic bilateral low back pain with right-sided sciatica This is also another possible cause of her lower extremity discomfort   4. B12 deficiency Some of the patient's issues may be related to her B12 deficiency and the fact that she has not had any evaluation of her levels or supplementation  months.  We are going to try to help facilitate this for the patient prior to any interventions.  Current Outpatient Medications on File Prior to Visit  Medication Sig Dispense Refill   acetaminophen  (TYLENOL ) 650 MG CR tablet Take 1,300 mg by mouth as needed.     albuterol  (VENTOLIN  HFA) 108 (90 Base) MCG/ACT inhaler Inhale 2 puffs into the lungs every 4 (four) hours as needed for wheezing or shortness of breath. 17 each 0   ascorbic acid (VITAMIN C) 1000 MG tablet Take 1 tablet by mouth daily.     atorvastatin (LIPITOR) 80 MG tablet Take 80 mg by mouth at bedtime.     benzonatate  (TESSALON ) 100 MG capsule Take 1 capsule (100 mg total) by mouth every 8 (eight) hours. (Patient taking differently: Take 100 mg by mouth 3 (three) times daily as needed for cough.) 21 capsule 0   Blood Glucose Monitoring Suppl DEVI 1 each by Does not apply route in the morning, at noon, and at bedtime. May substitute to any manufacturer covered by patient's insurance. 1 each 0   Cholecalciferol (VITAMIN D -1000 MAX ST) 25 MCG (1000 UT) tablet Take 1,000 Units by mouth daily.     cyanocobalamin  (VITAMIN B12) 1000 MCG/ML injection Inject 1,000 mcg into the muscle every 30 (thirty) days.     gabapentin  (NEURONTIN ) 300 MG capsule Take 1 capsule (300 mg total) by mouth 3 (three) times daily as needed. (Patient taking differently: Take 300 mg by mouth at bedtime.) 90 capsule 5   hydrochlorothiazide  (HYDRODIURIL ) 12.5 MG tablet Take 12.5 mg by mouth daily.     ipratropium-albuterol  (DUONEB) 0.5-2.5 (3) MG/3ML SOLN Inhale 3 mLs into the lungs every 6 (six) hours as needed.     levothyroxine  (SYNTHROID ) 50 MCG tablet Take 1 tablet (50 mcg total) by mouth daily. 90 tablet 3   lisinopril  (ZESTRIL ) 20 MG tablet Take 20 mg by mouth daily.     loratadine  (CLARITIN ) 10 MG tablet Take 1 tablet (10 mg total) by mouth daily. 30 tablet 0   MAGNESIUM PO Take 1 tablet by mouth daily.     metFORMIN (GLUCOPHAGE) 500 MG tablet Take 500 mg by  mouth daily.     mometasone (NASONEX) 50 MCG/ACT nasal spray Place 2 sprays into the nose daily.     Multiple Vitamin (MULTIVITAMIN) tablet Take 1 tablet by mouth daily.     ondansetron  (ZOFRAN -ODT) 8 MG disintegrating tablet Take 8 mg by mouth as needed.     rivaroxaban  (XARELTO ) 10 MG TABS tablet Take 1 tablet (10 mg total) by mouth daily. 90 tablet 3  triamcinolone  cream (KENALOG ) 0.1 % Apply 1 Application topically 2 (two) times daily as needed (irritation).     Vitamin D , Ergocalciferol , (DRISDOL ) 1.25 MG (50000 UNIT) CAPS capsule Take 1 capsule (50,000 Units total) by mouth every 7 (seven) days. 12 capsule 1   No current facility-administered medications on file prior to visit.    There are no Patient Instructions on file for this visit. No follow-ups on file.   Chelsey Redondo E Rocio Roam, NP

## 2024-02-29 ENCOUNTER — Ambulatory Visit

## 2024-02-29 ENCOUNTER — Encounter: Payer: Self-pay | Admitting: Cardiology

## 2024-02-29 ENCOUNTER — Ambulatory Visit: Attending: Cardiology | Admitting: Cardiology

## 2024-02-29 VITALS — BP 100/68 | HR 54 | Ht 59.0 in | Wt 122.6 lb

## 2024-02-29 DIAGNOSIS — E7800 Pure hypercholesterolemia, unspecified: Secondary | ICD-10-CM | POA: Diagnosis not present

## 2024-02-29 DIAGNOSIS — R002 Palpitations: Secondary | ICD-10-CM | POA: Diagnosis not present

## 2024-02-29 DIAGNOSIS — I10 Essential (primary) hypertension: Secondary | ICD-10-CM | POA: Diagnosis not present

## 2024-02-29 NOTE — Progress Notes (Signed)
 Cardiology Office Note:    Date:  02/29/2024   ID:  Christine Manning, DOB 1945-10-09, MRN [id number]  PCP:  Valli Gaw, MD   [facility] Providers Cardiologist:  None     Referring MD: Valene Gash, NP   Chief Complaint  Patient presents with   Follow-up    New patient  has been doing well with no complaints of chest pain, chest pressure or SOB, medciation reviewed verbally with patient    History of Present Illness:    Christine Manning is a 78 y.o. female with a hx of hypertension, hyperlipidemia, DVT on Xarelto  who presents due to palpitations.  States having occasional prominent heartbeat over the past 2 months.  Symptoms occurred after having gallbladder surgery 2 months ago.  Overall denies chest pain or shortness of breath.  Follows up with vascular surgery due to history of DVT and varicose veins.  Has occasional dizziness with changing positions or sometimes with standing.  Denies syncope.  Previously seen at [facility] from a cardiac perspective.  Last evaluation 09/2022.  Notes indicate patient having occasional intermittent palpitations, workup included echo 05/2020 with normal EF 65%, mild AI. Myocardial perfusion stress test 02/2020 normal with no ischemia.  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   Colon polyp    DDD (degenerative disc disease)    Family history of adverse reaction to anesthesia    sister with post op nausea   Heart murmur    History of deep vein thrombosis (DVT) of lower extremity 2023   right   History of Helicobacter infection    History of radioactive iodine thyroid  ablation    Hyperlipidemia    Hypertension    Hyperthyroidism    Hypothyroidism    Osteoporosis without current pathological fracture    Personal history of DVT (deep vein thrombosis) 1979   right leg during pregnancy   PONV (postoperative nausea and vomiting)    Primary osteoarthritis  of left knee    Pulmonary nodules    Thrombophilia (HCC)    Thyroid  nodule    Traveling blood clot in leg (HCC)    Type 2 diabetes mellitus with microalbuminuria, without long-term current use of insulin (HCC)    Vitamin D  deficiency     Past Surgical History:  Procedure Laterality Date   ABDOMINAL HYSTERECTOMY  09/20/1989   secondary to abnormal PAPs   ANAL RECTAL MANOMETRY N/A 01/24/2017   Procedure: ANO RECTAL MANOMETRY;  Surgeon: Sergio Dandy, MD;  Location: [facility];  Service: Endoscopy;  Laterality: N/A;   BLADDER SUSPENSION  09/20/2001   BREAST BIOPSY Right 1984   neg   CESAREAN SECTION  1979   COLONOSCOPY  09/20/2014   COLONOSCOPY  11/29/2019   CORONARY ANGIOPLASTY  09/21/2011   arteries were clean .  Dr. Garland Junk at [facility]    KNEE ARTHROSCOPY Left    THYROIDECTOMY, PARTIAL Right 02/01/2019   TONSILLECTOMY     TOTAL KNEE ARTHROPLASTY Left 07/08/2021   TUBAL LIGATION      Current Medications: No outpatient medications have been marked as taking for the 02/29/24 encounter (Office Visit) with Constancia Delton, MD.     Allergies:   Codeine, Latex, Augmentin [amoxicillin-pot clavulanate], Morphine, and Morphine and codeine   Social History   Socioeconomic History   Marital status: Legally Separated    Spouse name: Not on file   Number of children: 3  Years of education: Not on file   Highest education level: Not on file  Occupational History   Occupation: Retired  Tobacco Use   Smoking status: Never    Passive exposure: Never   Smokeless tobacco: Never  Vaping Use   Vaping status: Never Used  Substance and Sexual Activity   Alcohol use: No   Drug use: No   Sexual activity: Not Currently  Other Topics Concern   Not on file  Social History Narrative   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    Social Drivers of Health   Financial Resource Strain: Medium Risk (02/06/2024)   Received from [facility]   Overall Financial  Resource Strain (CARDIA)    Difficulty of Paying Living Expenses: Somewhat hard  Food Insecurity: No Food Insecurity (02/06/2024)   Received from [facility]   Hunger Vital Sign    Worried About Running Out of Food in the Last Year: Never true    Ran Out of Food in the Last Year: Never true  Transportation Needs: Unmet Transportation Needs (02/06/2024)   Received from [facility] - Transportation    In the past 12 months, has lack of transportation kept you from medical appointments or from getting medications?: Yes    Lack of Transportation (Non-Medical): Yes  Physical Activity: Not on file  Stress: Not on file  Social Connections: Not on file     Family History: The patient's family history includes Asthma in her brother, daughter, mother, and son; Breast cancer (age of onset: 67) in her maternal aunt; Colon cancer in her cousin; Colon cancer (age of onset: 67) in her maternal aunt; Colon polyps in her sister; Murrell Arrant' disease in her sister; Hernia in her brother; Mental illness in her father and mother; Stroke in her mother; Uterine cancer in her maternal grandmother. There is no history of Diabetes, Kidney disease, or Esophageal cancer.  ROS:   Please see the history of present illness.     All other systems reviewed and are negative.  EKGs/Labs/Other Studies Reviewed:    The following studies were reviewed today:  EKG Interpretation Date/Time:  Wednesday February 29 2024 11:17:05 EDT Ventricular Rate:  54 PR Interval:  146 QRS Duration:  120 QT Interval:  460 QTC Calculation: 436 R Axis:   -40  Text Interpretation: Sinus bradycardia Left axis deviation Right bundle branch block Inferior infarct , age undetermined Confirmed by Constancia Delton ([id number]) on 02/29/2024 11:36:01 AM    Recent Labs: 11/10/2023: Hemoglobin 12.0; Platelets 292.0 01/09/2024: ALT 10; BUN 22; Creatinine, Ser 1.33; Potassium 4.0; Sodium 142; TSH 0.40  Recent  Lipid Panel    Component Value Date/Time   CHOL 205 (H) 11/10/2023 1510   TRIG 139.0 11/10/2023 1510   HDL 62.50 11/10/2023 1510   CHOLHDL 3 11/10/2023 1510   VLDL 27.8 11/10/2023 1510   LDLCALC 114 (H) 11/10/2023 1510   LDLDIRECT 165.0 09/10/2015 0931     Risk Assessment/Calculations:             Physical Exam:    VS:  BP 100/68 (BP Location: Left Arm, Patient Position: Sitting)   Pulse (!) 54   Ht 4' 11 (1.499 m)   Wt 122 lb 9.6 oz (55.6 kg)   SpO2 99%   BMI 24.76 kg/m     Wt Readings from Last 3 Encounters:  02/29/24 122 lb 9.6 oz (55.6 kg)  02/15/24 124 lb (56.2 kg)  01/05/24  123 lb (55.8 kg)     GEN:  Well nourished, well developed in no acute distress HEENT: Normal NECK: No JVD; No carotid bruits CARDIAC: RRR, no murmurs, rubs, gallops RESPIRATORY:  Clear to auscultation without rales, wheezing or rhonchi  ABDOMEN: Soft, non-tender, non-distended MUSCULOSKELETAL:  No edema; No deformity  SKIN: Warm and dry NEUROLOGIC:  Alert and oriented x 3 PSYCHIATRIC:  Normal affect   ASSESSMENT:    1. Palpitations   2. Primary hypertension   3. Pure hypercholesterolemia    PLAN:    In order of problems listed above:  Palpitations, prominent heartbeat.  Placed 2-week cardiac monitor to evaluate any significant arrhythmias.  Last echo 2021 with normal EF. Hypertension, BP low normal, occasional dizziness.  Stop HCTZ.  Continue lisinopril  20 mg daily. Hyperlipidemia, Lipitor 80 mg daily.  Continue for now, continue adding Zetia if lipid panel stays elevated.  Follow-up after cardiac monitor      Medication Adjustments/Labs and Tests Ordered: Current medicines are reviewed at length with the patient today.  Concerns regarding medicines are outlined above.  Orders Placed This Encounter  Procedures   LONG TERM MONITOR (3-14 DAYS)   EKG 12-Lead   No orders of the defined types were placed in this encounter.   Patient Instructions  Medication Instructions:   -stop hydrochlorothiazide   *If you need a refill on your cardiac medications before your next appointment, please call your pharmacy*  Lab Work: No labs ordered today  If you have labs (blood work) drawn today and your tests are completely normal, you will receive your results only by: MyChart Message (if you have MyChart) OR A paper copy in the mail If you have any lab test that is abnormal or we need to change your treatment, we will call you to review the results.  Testing/Procedures: Your physician has recommended that you wear a Zio monitor.   This monitor is a medical device that records the heart's electrical activity. Doctors most often use these monitors to diagnose arrhythmias. Arrhythmias are problems with the speed or rhythm of the heartbeat. The monitor is a small device applied to your chest. You can wear one while you do your normal daily activities. While wearing this monitor if you have any symptoms to push the button and record what you felt. Once you have worn this monitor for the period of time provider prescribed (Usually 14 days), you will return the monitor device in the postage paid box. Once it is returned they will download the data collected and provide us  with a report which the provider will then review and we will call you with those results. Important tips:  Avoid showering during the first 24 hours of wearing the monitor. Avoid excessive sweating to help maximize wear time. Do not submerge the device, no hot tubs, and no swimming pools. Keep any lotions or oils away from the patch. After 24 hours you may shower with the patch on. Take brief showers with your back facing the shower head.  Do not remove patch once it has been placed because that will interrupt data and decrease adhesive wear time. Push the button when you have any symptoms and write down what you were feeling. Once you have completed wearing your monitor, remove and place into box which has  postage paid and place in your outgoing mailbox.  If for some reason you have misplaced your box then call our office and we can provide another box and/or mail it off for you.  Follow-Up: At [facility], you and your health needs are our priority.  As part of our continuing mission to provide you with exceptional heart care, our providers are all part of one team.  This team includes your primary Cardiologist (physician) and Advanced Practice Providers or APPs (Physician Assistants and Nurse Practitioners) who all work together to provide you with the care you need, when you need it.  Your next appointment:   3 month(s)  Provider:   You may see Dr. Junnie Olives or one of the following Advanced Practice Providers on your designated Care Team:   Laneta Pintos, NP Gildardo Labrador, PA-C Varney Gentleman, PA-C Cadence [place], PA-C Ronald Cockayne, NP Morey Ar, NP    We recommend signing up for the patient portal called MyChart.  Sign up information is provided on this After Visit Summary.  MyChart is used to connect with patients for Virtual Visits (Telemedicine).  Patients are able to view lab/test results, encounter notes, upcoming appointments, etc.  Non-urgent messages can be sent to your provider as well.   To learn more about what you can do with MyChart, go to [email or web address].          Signed, Constancia Delton, MD  02/29/2024 12:39 PM    [facility]

## 2024-02-29 NOTE — Patient Instructions (Signed)
 Medication Instructions:  -stop hydrochlorothiazide   *If you need a refill on your cardiac medications before your next appointment, please call your pharmacy*  Lab Work: No labs ordered today  If you have labs (blood work) drawn today and your tests are completely normal, you will receive your results only by: MyChart Message (if you have MyChart) OR A paper copy in the mail If you have any lab test that is abnormal or we need to change your treatment, we will call you to review the results.  Testing/Procedures: Your physician has recommended that you wear a Zio monitor.   This monitor is a medical device that records the heart's electrical activity. Doctors most often use these monitors to diagnose arrhythmias. Arrhythmias are problems with the speed or rhythm of the heartbeat. The monitor is a small device applied to your chest. You can wear one while you do your normal daily activities. While wearing this monitor if you have any symptoms to push the button and record what you felt. Once you have worn this monitor for the period of time provider prescribed (Usually 14 days), you will return the monitor device in the postage paid box. Once it is returned they will download the data collected and provide us  with a report which the provider will then review and we will call you with those results. Important tips:  Avoid showering during the first 24 hours of wearing the monitor. Avoid excessive sweating to help maximize wear time. Do not submerge the device, no hot tubs, and no swimming pools. Keep any lotions or oils away from the patch. After 24 hours you may shower with the patch on. Take brief showers with your back facing the shower head.  Do not remove patch once it has been placed because that will interrupt data and decrease adhesive wear time. Push the button when you have any symptoms and write down what you were feeling. Once you have completed wearing your monitor, remove and  place into box which has postage paid and place in your outgoing mailbox.  If for some reason you have misplaced your box then call our office and we can provide another box and/or mail it off for you.   Follow-Up: At [facility], you and your health needs are our priority.  As part of our continuing mission to provide you with exceptional heart care, our providers are all part of one team.  This team includes your primary Cardiologist (physician) and Advanced Practice Providers or APPs (Physician Assistants and Nurse Practitioners) who all work together to provide you with the care you need, when you need it.  Your next appointment:   3 month(s)  Provider:   You may see Dr. Junnie Olives or one of the following Advanced Practice Providers on your designated Care Team:   Laneta Pintos, NP Gildardo Labrador, PA-C Varney Gentleman, PA-C Cadence [place], PA-C Ronald Cockayne, NP Morey Ar, NP    We recommend signing up for the patient portal called MyChart.  Sign up information is provided on this After Visit Summary.  MyChart is used to connect with patients for Virtual Visits (Telemedicine).  Patients are able to view lab/test results, encounter notes, upcoming appointments, etc.  Non-urgent messages can be sent to your provider as well.   To learn more about what you can do with MyChart, go to [email or web address].

## 2024-04-01 ENCOUNTER — Other Ambulatory Visit: Payer: Self-pay | Admitting: Family Medicine

## 2024-04-01 DIAGNOSIS — E559 Vitamin D deficiency, unspecified: Secondary | ICD-10-CM

## 2024-04-03 DIAGNOSIS — R002 Palpitations: Secondary | ICD-10-CM | POA: Diagnosis not present

## 2024-04-04 ENCOUNTER — Ambulatory Visit: Payer: Self-pay | Admitting: Cardiology

## 2024-04-04 DIAGNOSIS — R002 Palpitations: Secondary | ICD-10-CM

## 2024-04-19 ENCOUNTER — Telehealth: Payer: Self-pay | Admitting: Family Medicine

## 2024-04-19 NOTE — Telephone Encounter (Signed)
 Received a call that patient wanted to be seen sooner for a TOC. Called patient to see if she could come May 09, 2024 @ 1pm. Patient states that she called her insurance and they ok'd her to go back to her provider in [place]. Patient does not want to do a TOC at this office.

## 2024-06-01 ENCOUNTER — Ambulatory Visit: Attending: Cardiology | Admitting: Cardiology

## 2024-06-01 ENCOUNTER — Encounter: Payer: Self-pay | Admitting: Cardiology

## 2024-06-01 VITALS — BP 100/68 | HR 55 | Ht 59.0 in | Wt 117.4 lb

## 2024-06-01 DIAGNOSIS — I471 Supraventricular tachycardia, unspecified: Secondary | ICD-10-CM | POA: Diagnosis not present

## 2024-06-01 DIAGNOSIS — I10 Essential (primary) hypertension: Secondary | ICD-10-CM | POA: Diagnosis not present

## 2024-06-01 DIAGNOSIS — E7800 Pure hypercholesterolemia, unspecified: Secondary | ICD-10-CM | POA: Diagnosis not present

## 2024-06-01 NOTE — Progress Notes (Signed)
 Cardiology Office Note:    Date:  06/01/2024   ID:  Christine Manning, DOB Aug 23, 1946, MRN [id number]  PCP:  Patient, No Pcp Per   [facility] Providers Cardiologist:  None     Referring MD: No ref. provider found   Chief Complaint  Patient presents with   Follow-up    Patient c/o palpitations more frequently lately.     History of Present Illness:    Christine Manning is a 78 y.o. female with a hx of hypertension, hyperlipidemia, DVT on Xarelto  who presents for follow-up.  She was last seen due to palpitations.  Cardiac monitor was placed to evaluate any significant arrhythmias.  She still has occasional palpitations, denies syncope.  Last TSH was adequately controlled.  Has a history of dizziness, low BP.  She was advised to stop HCTZ at last clinic visit.  She still takes HCTZ for some reason.   Prior notes/testing Outside echo 05/2020 with normal EF 65%, mild AI. Myocardial perfusion stress test 02/2020 normal with no ischemia.  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   Colon polyp    DDD (degenerative disc disease)    Family history of adverse reaction to anesthesia    sister with post op nausea   Heart murmur    History of deep vein thrombosis (DVT) of lower extremity 2023   right   History of Helicobacter infection    History of radioactive iodine thyroid  ablation    Hyperlipidemia    Hypertension    Hyperthyroidism    Hypothyroidism    Osteoporosis without current pathological fracture    Personal history of DVT (deep vein thrombosis) 1979   right leg during pregnancy   PONV (postoperative nausea and vomiting)    Primary osteoarthritis of left knee    Pulmonary nodules    Thrombophilia (HCC)    Thyroid  nodule    Traveling blood clot in leg (HCC)    Type 2 diabetes mellitus with microalbuminuria, without long-term current use of insulin (HCC)    Vitamin D  deficiency      Past Surgical History:  Procedure Laterality Date   ABDOMINAL HYSTERECTOMY  09/20/1989   secondary to abnormal PAPs   ANAL RECTAL MANOMETRY N/A 01/24/2017   Procedure: ANO RECTAL MANOMETRY;  Surgeon: Shila Gustav GAILS, MD;  Location: [facility];  Service: Endoscopy;  Laterality: N/A;   BLADDER SUSPENSION  09/20/2001   BREAST BIOPSY Right 1984   neg   CESAREAN SECTION  1979   COLONOSCOPY  09/20/2014   COLONOSCOPY  11/29/2019   CORONARY ANGIOPLASTY  09/21/2011   arteries were clean .  Dr. Nivia at [facility]    KNEE ARTHROSCOPY Left    THYROIDECTOMY, PARTIAL Right 02/01/2019   TONSILLECTOMY     TOTAL KNEE ARTHROPLASTY Left 07/08/2021   TUBAL LIGATION      Current Medications: Current Meds  Medication Sig   acetaminophen  (TYLENOL ) 650 MG CR tablet Take 1,300 mg by mouth as needed.   albuterol  (VENTOLIN  HFA) 108 (90 Base) MCG/ACT inhaler Inhale 2 puffs into the lungs every 4 (four) hours as needed for wheezing or shortness of breath.   ascorbic acid (VITAMIN C) 1000 MG tablet Take 1 tablet by mouth daily.   atorvastatin (LIPITOR) 80 MG tablet Take 80 mg by mouth at bedtime.   benzonatate  (TESSALON ) 100 MG capsule Take 1 capsule (100 mg total) by mouth every 8 (eight)  hours.   Blood Glucose Monitoring Suppl DEVI 1 each by Does not apply route in the morning, at noon, and at bedtime. May substitute to any manufacturer covered by patient's insurance.   cyanocobalamin  (VITAMIN B12) 1000 MCG/ML injection Inject 1,000 mcg into the muscle every 30 (thirty) days.   gabapentin  (NEURONTIN ) 300 MG capsule Take 1 capsule (300 mg total) by mouth 3 (three) times daily as needed.   ipratropium-albuterol  (DUONEB) 0.5-2.5 (3) MG/3ML SOLN Inhale 3 mLs into the lungs every 6 (six) hours as needed.   levothyroxine  (SYNTHROID ) 50 MCG tablet Take 1 tablet (50 mcg total) by mouth daily.   lisinopril  (ZESTRIL ) 20 MG tablet Take 20 mg by mouth daily.   loratadine  (CLARITIN ) 10 MG tablet Take 1 tablet (10 mg  total) by mouth daily.   MAGNESIUM PO Take 1 tablet by mouth daily.   metFORMIN (GLUCOPHAGE) 500 MG tablet Take 500 mg by mouth daily.   mometasone (NASONEX) 50 MCG/ACT nasal spray Place 2 sprays into the nose daily.   Multiple Vitamin (MULTIVITAMIN) tablet Take 1 tablet by mouth daily.   ondansetron  (ZOFRAN -ODT) 8 MG disintegrating tablet Take 8 mg by mouth as needed.   rivaroxaban  (XARELTO ) 10 MG TABS tablet Take 1 tablet (10 mg total) by mouth daily.   triamcinolone  cream (KENALOG ) 0.1 % Apply 1 Application topically 2 (two) times daily as needed (irritation).   Vitamin D , Ergocalciferol , (DRISDOL ) 1.25 MG (50000 UNIT) CAPS capsule Take 1 capsule (50,000 Units total) by mouth every 7 (seven) days.   [DISCONTINUED] hydrochlorothiazide  (HYDRODIURIL ) 12.5 MG tablet Take 12.5 mg by mouth daily.     Allergies:   Codeine, Latex, Morphine, Morphine and codeine, and Amoxicillin-pot clavulanate   Social History   Socioeconomic History   Marital status: Legally Separated    Spouse name: Not on file   Number of children: 3   Years of education: Not on file   Highest education level: Not on file  Occupational History   Occupation: Retired  Tobacco Use   Smoking status: Never    Passive exposure: Never   Smokeless tobacco: Never  Vaping Use   Vaping status: Never Used  Substance and Sexual Activity   Alcohol use: No   Drug use: No   Sexual activity: Not Currently  Other Topics Concern   Not on file  Social History Narrative   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    Social Drivers of Health   Financial Resource Strain: Medium Risk (02/06/2024)   Received from [facility]   Overall Financial Resource Strain (CARDIA)    Difficulty of Paying Living Expenses: Somewhat hard  Food Insecurity: No Food Insecurity (02/06/2024)   Received from [facility]   Hunger Vital Sign    Within the past 12 months, you worried that your food would run out  before you got the money to buy more.: Never true    Within the past 12 months, the food you bought just didn't last and you didn't have money to get more.: Never true  Transportation Needs: Unmet Transportation Needs (02/06/2024)   Received from [facility] - Transportation    In the past 12 months, has lack of transportation kept you from medical appointments or from getting medications?: Yes    Lack of Transportation (Non-Medical): Yes  Physical Activity: Not on file  Stress: Not on file  Social Connections: Not on file     Family History: The  patient's family history includes Asthma in her brother, daughter, mother, and son; Breast cancer (age of onset: 61) in her maternal aunt; Colon cancer in her cousin; Colon cancer (age of onset: 66) in her maternal aunt; Colon polyps in her sister; Yvone' disease in her sister; Hernia in her brother; Mental illness in her father and mother; Stroke in her mother; Uterine cancer in her maternal grandmother. There is no history of Diabetes, Kidney disease, or Esophageal cancer.  ROS:   Please see the history of present illness.     All other systems reviewed and are negative.  EKGs/Labs/Other Studies Reviewed:    The following studies were reviewed today:  EKG Interpretation Date/Time:  Friday June 01 2024 13:50:35 EDT Ventricular Rate:  55 PR Interval:  146 QRS Duration:  122 QT Interval:  462 QTC Calculation: 441 R Axis:   -63  Text Interpretation: Sinus bradycardia Right bundle branch block Left anterior fascicular block Bifascicular block Confirmed by Darliss Rogue ([id number]) on 06/01/2024 1:57:47 PM    Recent Labs: 11/10/2023: Hemoglobin 12.0; Platelets 292.0 01/09/2024: ALT 10; BUN 22; Creatinine, Ser 1.33; Potassium 4.0; Sodium 142; TSH 0.40  Recent Lipid Panel    Component Value Date/Time   CHOL 205 (H) 11/10/2023 1510   TRIG 139.0 11/10/2023 1510   HDL 62.50 11/10/2023 1510   CHOLHDL 3  11/10/2023 1510   VLDL 27.8 11/10/2023 1510   LDLCALC 114 (H) 11/10/2023 1510   LDLDIRECT 165.0 09/10/2015 0931     Risk Assessment/Calculations:             Physical Exam:    VS:  BP 100/68 (BP Location: Left Arm, Patient Position: Sitting, Cuff Size: Normal)   Pulse (!) 55   Ht 4' 11 (1.499 m)   Wt 117 lb 6 oz (53.2 kg)   SpO2 98%   BMI 23.71 kg/m     Wt Readings from Last 3 Encounters:  06/01/24 117 lb 6 oz (53.2 kg)  02/29/24 122 lb 9.6 oz (55.6 kg)  02/15/24 124 lb (56.2 kg)     GEN:  Well nourished, well developed in no acute distress HEENT: Normal NECK: No JVD; No carotid bruits CARDIAC: RRR, no murmurs, rubs, gallops RESPIRATORY:  Clear to auscultation without rales, wheezing or rhonchi  ABDOMEN: Soft, non-tender, non-distended MUSCULOSKELETAL:  No edema; No deformity  SKIN: Warm and dry NEUROLOGIC:  Alert and oriented x 3 PSYCHIATRIC:  Normal affect   ASSESSMENT:    1. Paroxysmal SVT (supraventricular tachycardia) (HCC)   2. Primary hypertension   3. Pure hypercholesterolemia    PLAN:    In order of problems listed above:  Palpitations, monitor showing several episodes of paroxysmal SVT, prominent heartbeat. Last echo 2021 with normal EF.  Repeat echo, refer to EP for any additional input.  Low BP, bradycardia preventing AV Manning agent at this time. Hypertension, BP low normal, occasional dizziness.  Stop HCTZ.  Continue lisinopril  20 mg daily. Hyperlipidemia, continue Lipitor 80 mg daily.  Follow-up in 3 months.     Medication Adjustments/Labs and Tests Ordered: Current medicines are reviewed at length with the patient today.  Concerns regarding medicines are outlined above.  Orders Placed This Encounter  Procedures   Ambulatory referral to Cardiac Electrophysiology   EKG 12-Lead   ECHOCARDIOGRAM COMPLETE   No orders of the defined types were placed in this encounter.   Patient Instructions  Medication Instructions:  - STOP  hydrochlorothiazide   *If you need a refill on your cardiac medications before your  next appointment, please call your pharmacy*  Lab Work: No labs ordered today  If you have labs (blood work) drawn today and your tests are completely normal, you will receive your results only by: MyChart Message (if you have MyChart) OR A paper copy in the mail If you have any lab test that is abnormal or we need to change your treatment, we will call you to review the results.  Testing/Procedures: Your physician has requested that you have an echocardiogram. Echocardiography is a painless test that uses sound waves to create images of your heart. It provides your doctor with information about the size and shape of your heart and how well your heart's chambers and valves are working.   You may receive an ultrasound enhancing agent through an IV if needed to better visualize your heart during the echo. This procedure takes approximately one hour.  There are no restrictions for this procedure.  This will take place at [address] [facility] [address] [facility], [place] [address]  Please note: We ask at that you not bring children with you during ultrasound (echo/ vascular) testing. Due to room size and safety concerns, children are not allowed in the ultrasound rooms during exams. Our front office staff cannot provide observation of children in our lobby area while testing is being conducted. An adult accompanying a patient to their appointment will only be allowed in the ultrasound room at the discretion of the ultrasound technician under special circumstances. We apologize for any inconvenience.   Follow-Up: At [facility], you and your health needs are our priority.  As part of our continuing mission to provide you with exceptional heart care, our providers are all part of one team.  This team includes your primary Cardiologist (physician) and Advanced Practice Providers or APPs (Physician  Assistants and Nurse Practitioners) who all work together to provide you with the care you need, when you need it.  Your next appointment:   3 month(s)  Provider:   You may see Dr. Darliss or one of the following Advanced Practice Providers on your designated Care Team:   Lonni Meager, NP Lesley Maffucci, PA-C Bernardino Bring, PA-C Cadence Latham, PA-C Tylene Lunch, NP Barnie Hila, NP    We recommend signing up for the patient portal called MyChart.  Sign up information is provided on this After Visit Summary.  MyChart is used to connect with patients for Virtual Visits (Telemedicine).  Patients are able to view lab/test results, encounter notes, upcoming appointments, etc.  Non-urgent messages can be sent to your provider as well.   To learn more about what you can do with MyChart, go to [email or web address].          Signed, Redell Darliss, MD  06/01/2024 2:38 PM     [facility]

## 2024-06-01 NOTE — Patient Instructions (Signed)
 Medication Instructions:  - STOP hydrochlorothiazide   *If you need a refill on your cardiac medications before your next appointment, please call your pharmacy*  Lab Work: No labs ordered today  If you have labs (blood work) drawn today and your tests are completely normal, you will receive your results only by: MyChart Message (if you have MyChart) OR A paper copy in the mail If you have any lab test that is abnormal or we need to change your treatment, we will call you to review the results.  Testing/Procedures: Your physician has requested that you have an echocardiogram. Echocardiography is a painless test that uses sound waves to create images of your heart. It provides your doctor with information about the size and shape of your heart and how well your heart's chambers and valves are working.   You may receive an ultrasound enhancing agent through an IV if needed to better visualize your heart during the echo. This procedure takes approximately one hour.  There are no restrictions for this procedure.  This will take place at [address] [facility] [address] [facility], [place] [address]  Please note: We ask at that you not bring children with you during ultrasound (echo/ vascular) testing. Due to room size and safety concerns, children are not allowed in the ultrasound rooms during exams. Our front office staff cannot provide observation of children in our lobby area while testing is being conducted. An adult accompanying a patient to their appointment will only be allowed in the ultrasound room at the discretion of the ultrasound technician under special circumstances. We apologize for any inconvenience.   Follow-Up: At [facility], you and your health needs are our priority.  As part of our continuing mission to provide you with exceptional heart care, our providers are all part of one team.  This team includes your primary Cardiologist (physician) and Advanced Practice  Providers or APPs (Physician Assistants and Nurse Practitioners) who all work together to provide you with the care you need, when you need it.  Your next appointment:   3 month(s)  Provider:   You may see Dr. Darliss or one of the following Advanced Practice Providers on your designated Care Team:   Lonni Meager, NP Lesley Maffucci, PA-C Bernardino Bring, PA-C Cadence [place], PA-C Tylene Lunch, NP Barnie Hila, NP    We recommend signing up for the patient portal called MyChart.  Sign up information is provided on this After Visit Summary.  MyChart is used to connect with patients for Virtual Visits (Telemedicine).  Patients are able to view lab/test results, encounter notes, upcoming appointments, etc.  Non-urgent messages can be sent to your provider as well.   To learn more about what you can do with MyChart, go to [email or web address].

## 2024-06-12 NOTE — Progress Notes (Signed)
 History of Present Illness  Christine Manning is a 78 y.o. female who returns for follow up.  1) Hypothyroidism. Her TSH level remains in normal range on levothyroxine  50 mcg daily. In 01/2019 she underwent right thyroid  lobectomy for a right-sided nodule with compressive symptoms. No missed doses of levothyroxine . Denies heat intolerance, heart racing, heart palpitations, tremor, or unintentional weight loss/gain.   2) Type 2 diabetes. Her Hb A1c is 6.5%. She has a glucometer. She checks her blood glucose in the mornings. Sugars are in the range of 84 - 126 mgdl.  Her diabetes is well controlled on metformin monotherapy. She does tolerate metformin.   Past Medical History:  Diagnosis Date  . Arthritis   . Asthma without status asthmaticus (HHS-HCC)   . Chronic back pain   . Hyperlipidemia   . Hypertension   . Hypothyroidism   . IBS (irritable bowel syndrome)   . Osteoporosis, post-menopausal   . Thyroid  disease   . Type 2 diabetes mellitus (CMS/HHS-HCC)     Outpatient Medications Marked as Taking for the 06/12/24 encounter (Office Visit) with Solum, Anna Melissa, MD  Medication Sig Dispense Refill  . albuterol  (PROVENTIL ) 2.5 mg /3 mL (0.083 %) nebulizer solution Take 3 mLs (2.5 mg total) by nebulization every 6 (six) hours as needed for wheezing or shortness of breath.    . albuterol  MDI, PROVENTIL , VENTOLIN , PROAIR , HFA 90 mcg/actuation inhaler Inhale 2 inhalations into the lungs every 4 (four) hours as needed for Wheezing or Shortness of Breath    . ascorbic acid, vitamin C, (VITAMIN C) 1000 MG tablet Take 1,000 mg by mouth once daily    . atorvastatin (LIPITOR) 80 MG tablet Take 80 mg by mouth once daily    . benzonatate  (TESSALON ) 100 MG capsule Take 100 mg by mouth every 8 (eight) hours    . ergocalciferol , vitamin D2, 1,250 mcg (50,000 unit) capsule Take 50,000 Units by mouth once a week    . fluticasone -salmeterol (ADVAIR DISKUS) 250-50 mcg/dose diskus inhaler Inhale into the  lungs.    . gabapentin  (NEURONTIN ) 300 MG capsule Take 300 mg by mouth 3 (three) times daily as needed    . ipratropium-albuteroL  (DUO-NEB) nebulizer solution Take 3 mLs by nebulization every 6 (six) hours as needed for Wheezing or Shortness of Breath    . Lactobac. rhamnosus GG-inulin 10 billion cell -200 mg Take by mouth.    . levothyroxine  (SYNTHROID ) 50 MCG tablet Take 50 mcg by mouth once daily    . lisinopriL  (ZESTRIL ) 20 MG tablet Take 20 mg by mouth once daily    . loratadine  (CLARITIN ) 10 mg capsule Take 10 mg by mouth once daily as needed    . magnesium 200 mg Take 200 mg by mouth once daily    . metFORMIN (GLUCOPHAGE) 500 MG tablet Take 500 mg by mouth daily with breakfast    . metoprolol  tartrate (LOPRESSOR ) 25 MG tablet Take by mouth.    . mometasone (NASONEX) 50 mcg/actuation nasal spray by Nasal route.    . multivitamin tablet Take by mouth.    . ondansetron  (ZOFRAN ) 4 MG tablet Take by mouth.    . simvastatin  (ZOCOR ) 40 MG tablet Take by mouth.    . XARELTO  10 mg tablet Take 10 mg by mouth daily with breakfast    . ZINC ACETATE ORAL Take 1 each by mouth once daily      Exam BP 108/60   Pulse 62   Ht 149.9 cm (4' 11)  Wt 54.4 kg (120 lb)   SpO2 99%   BMI 24.24 kg/m  GEN: well developed, well nourished female, in NAD. HEENT: No proptosis, lid lag or stare. Oropharynx is clear. EOMI. NECK: supple, trachea midline, thyroid  exam demonstrates no palpable mass or nodules. No TTP.  RESPIRATORY: clear bilaterally, no wheeze, good inspiratory effort. CV: No carotid bruits, RRR. MUSCULOSKELETAL: normal gait and station, no clubbing, +fine tremor of left hand when outstretched and non of right. She is unable to full extend the left arm due to left shoulder pain. EXT: no edema  LYMPH: no submandibular or supraclavicular LAD NEURO: PERRL  PSYC: alert and oriented, good insight  Labs  Appointment on 06/05/2024  Component Date Value Ref Range Status  . Thyroid  Stimulating  Hormone (TSH) 06/05/2024 2.325  0.450-5.330 uIU/ml uIU/mL Final   Reference Range for Pregnant Females >= 18 yrs old: Normal Range for 1st trimester: 0.05-3.70 ulU/ml Normal Range for 2nd trimester: 0.31-4.35 ulU/ml  . Glucose 06/05/2024 83  70 - 110 mg/dL Final  . Sodium 90/83/7974 143  136 - 145 mmol/L Final  . Potassium 06/05/2024 4.9  3.6 - 5.1 mmol/L Final  . Chloride 06/05/2024 107  97 - 109 mmol/L Final  . Carbon Dioxide (CO2) 06/05/2024 28.4  22.0 - 32.0 mmol/L Final  . Calcium 06/05/2024 9.3  8.7 - 10.3 mg/dL Final  . Urea Nitrogen (BUN) 06/05/2024 15  7 - 25 mg/dL Final  . Creatinine 90/83/7974 1.1  0.6 - 1.1 mg/dL Final  . Glomerular Filtration Rate (eGFR) 06/05/2024 52 (L)  >60 mL/min/1.73sq m Final   Comment: CKD-EPI (2021) does not include patient's race in the calculation of eGFR.  Monitoring changes of plasma creatinine and eGFR over time is useful for monitoring kidney function.   Interpretive Ranges for eGFR (CKD-EPI 2021):  eGFR:       >60 mL/min/1.73 sq. m - Normal eGFR:       30-59 mL/min/1.73 sq. m - Moderately Decreased eGFR:       15-29 mL/min/1.73 sq. m  - Severely Decreased eGFR:       < 15 mL/min/1.73 sq. m  - Kidney Failure    Note: These eGFR calculations do not apply in acute situations when eGFR is changing rapidly or patients on dialysis. CKD-EPI (2021) does not include patient's race in the calculation of eGFR.  Monitoring changes of plasma creatinine and eGFR over time is useful for monitoring kidney function.   Interpretive Ranges for eGFR (CKD-EPI 2021):  eGFR:       >60 mL/min/1.73 sq. m - Normal eGFR:       30-59 mL/min/1.73 sq. m - Moderately Decreased eGFR:       15-29 mL/min/1.73 sq. m  - Severely Decreased eGFR:       < 15 mL/min/1.73 sq. m  -                           Kidney Failure    Note: These eGFR calculations do not apply in acute situations when eGFR is changing rapidly or patients on dialysis.  . BUN/Crea Ratio 06/05/2024 13.6   6.0 - 20.0 Final  . Anion Gap w/K 06/05/2024 12.5  6.0 - 16.0 Final  . Hemoglobin A1C 06/05/2024 6.5 (H)  4.2 - 5.6 % Final  . Average Blood Glucose (Calc) 06/05/2024 140  mg/dL Final     Assessment Hypothyroidism.    Type 2 diabetes CKD stage 3a  Plan -  Hypothyroidism is adequately controlled. Continue levothyroxine  50 mcg daily.   - Counseled her that her diabetes is mild and well controlled on low dose metformin.  - Check sugars 3-4 times weekly. Bring glucometer to follow up appts.   - Will request copy of last dilated eye exam report from [facility].     - Continue ACE-I and statin.  - Anticipate follow up in 6 months with labs prior.

## 2024-06-17 ENCOUNTER — Emergency Department (HOSPITAL_COMMUNITY)
Admission: EM | Admit: 2024-06-17 | Discharge: 2024-06-17 | Disposition: A | Discharge status: Home / Self Care | Attending: Emergency Medicine | Admitting: Emergency Medicine

## 2024-06-17 ENCOUNTER — Emergency Department (HOSPITAL_COMMUNITY)

## 2024-06-17 DIAGNOSIS — Y92014 Private driveway to single-family (private) house as the place of occurrence of the external cause: Secondary | ICD-10-CM | POA: Insufficient documentation

## 2024-06-17 DIAGNOSIS — S0003XA Contusion of scalp, initial encounter: Secondary | ICD-10-CM | POA: Insufficient documentation

## 2024-06-17 DIAGNOSIS — S8002XA Contusion of left knee, initial encounter: Secondary | ICD-10-CM | POA: Insufficient documentation

## 2024-06-17 DIAGNOSIS — S8001XA Contusion of right knee, initial encounter: Secondary | ICD-10-CM | POA: Insufficient documentation

## 2024-06-17 DIAGNOSIS — I4891 Unspecified atrial fibrillation: Secondary | ICD-10-CM | POA: Insufficient documentation

## 2024-06-17 DIAGNOSIS — W010XXA Fall on same level from slipping, tripping and stumbling without subsequent striking against object, initial encounter: Secondary | ICD-10-CM | POA: Insufficient documentation

## 2024-06-17 DIAGNOSIS — E119 Type 2 diabetes mellitus without complications: Secondary | ICD-10-CM | POA: Insufficient documentation

## 2024-06-17 DIAGNOSIS — I10 Essential (primary) hypertension: Secondary | ICD-10-CM | POA: Diagnosis not present

## 2024-06-17 DIAGNOSIS — M542 Cervicalgia: Secondary | ICD-10-CM | POA: Insufficient documentation

## 2024-06-17 DIAGNOSIS — Z91040 Latex allergy status: Secondary | ICD-10-CM | POA: Insufficient documentation

## 2024-06-17 DIAGNOSIS — W19XXXA Unspecified fall, initial encounter: Secondary | ICD-10-CM

## 2024-06-17 DIAGNOSIS — Z7984 Long term (current) use of oral hypoglycemic drugs: Secondary | ICD-10-CM | POA: Insufficient documentation

## 2024-06-17 DIAGNOSIS — S8000XA Contusion of unspecified knee, initial encounter: Secondary | ICD-10-CM

## 2024-06-17 DIAGNOSIS — Z7901 Long term (current) use of anticoagulants: Secondary | ICD-10-CM | POA: Diagnosis not present

## 2024-06-17 DIAGNOSIS — S63501A Unspecified sprain of right wrist, initial encounter: Secondary | ICD-10-CM | POA: Diagnosis not present

## 2024-06-17 DIAGNOSIS — Z79899 Other long term (current) drug therapy: Secondary | ICD-10-CM | POA: Diagnosis not present

## 2024-06-17 DIAGNOSIS — M79641 Pain in right hand: Secondary | ICD-10-CM | POA: Diagnosis present

## 2024-06-17 LAB — CBG MONITORING, ED: Glucose-Capillary: 74 mg/dL (ref 70–99)

## 2024-06-17 MED ORDER — TRAMADOL HCL 50 MG PO TABS
50.0000 mg | ORAL_TABLET | Freq: Once | ORAL | Status: AC
  Administered 2024-06-17: 50 mg via ORAL
  Filled 2024-06-17: qty 1

## 2024-06-17 MED ORDER — TRAMADOL HCL 50 MG PO TABS: 50.0000 mg | ORAL_TABLET | Freq: Four times a day (QID) | ORAL | 0 refills | Status: AC | PRN

## 2024-06-17 NOTE — ED Notes (Signed)
 AVS provided by edp was reviewed with pt. Pt verbalized understanding with no additional questions at this time. Pt verified pharmacy. Pt going home with son at bedside.

## 2024-06-17 NOTE — Discharge Instructions (Addendum)
 As we discussed, your CT scans did not show any fracture or bleeding  Please take Tylenol  for pain and tramadol for severe pain  You have a sprain of your wrist and you can wear the splint as needed.  Please follow-up with your doctor  Return to ER if you have another fall or severe headache or vomiting or trouble walking

## 2024-06-17 NOTE — ED Triage Notes (Signed)
 Fell in driveway hitting head on concrete. Bump to right temple. On Xarelto . Tripped over her sandles. Right hand pain. Denies LOOC but felt lightheaded afterwards. Occurred around 1300-1330.

## 2024-06-17 NOTE — ED Provider Notes (Signed)
 [place] EMERGENCY DEPARTMENT AT [facility] Provider Note   CSN: [id number] Arrival date & time: 06/17/24  1439     Patient presents with: Fall and Head Injury   Christine Manning is a 78 y.o. female history of diabetes, hypertension, here presenting with fall and head injury.  Patient states that she was outside and her knee gave out and she fell and hit her head.  Patient denies passing out.  Patient has some mild headache afterwards.  Also complained of bilateral knee pain and bilateral hand pain.  No meds given prior to arrival.  Patient is on Eliquis for A-fib.   The history is provided by the patient.       Prior to Admission medications   Medication Sig Start Date End Date Taking? Authorizing Provider  acetaminophen  (TYLENOL ) 650 MG CR tablet Take 1,300 mg by mouth as needed.    [provider]  albuterol  (VENTOLIN  HFA) 108 (90 Base) MCG/ACT inhaler Inhale 2 puffs into the lungs every 4 (four) hours as needed for wheezing or shortness of breath. 11/20/23   Teresa Shelba SAUNDERS, NP  ascorbic acid (VITAMIN C) 1000 MG tablet Take 1 tablet by mouth daily.    [provider]  atorvastatin (LIPITOR) 80 MG tablet Take 80 mg by mouth at bedtime.    [provider]  benzonatate  (TESSALON ) 100 MG capsule Take 1 capsule (100 mg total) by mouth every 8 (eight) hours. 11/20/23   Teresa Shelba SAUNDERS, NP  Blood Glucose Monitoring Suppl DEVI 1 each by Does not apply route in the morning, at noon, and at bedtime. May substitute to any manufacturer covered by patient's insurance. 11/24/23   Hope Merle, MD  cyanocobalamin  (VITAMIN B12) 1000 MCG/ML injection Inject 1,000 mcg into the muscle every 30 (thirty) days. 01/20/23   [provider]  gabapentin  (NEURONTIN ) 300 MG capsule Take 1 capsule (300 mg total) by mouth 3 (three) times daily as needed. 12/09/17   Marylynn Verneita CROME, MD  ipratropium-albuterol  (DUONEB) 0.5-2.5 (3) MG/3ML SOLN Inhale 3 mLs into the lungs  every 6 (six) hours as needed. 09/19/23   [provider]  levothyroxine  (SYNTHROID ) 50 MCG tablet Take 1 tablet (50 mcg total) by mouth daily. 11/17/23   Hope Merle, MD  lisinopril  (ZESTRIL ) 20 MG tablet Take 20 mg by mouth daily.    [provider]  loratadine  (CLARITIN ) 10 MG tablet Take 1 tablet (10 mg total) by mouth daily. 07/03/15   Maribeth Camellia MATSU, MD  MAGNESIUM PO Take 1 tablet by mouth daily.    [provider]  metFORMIN (GLUCOPHAGE) 500 MG tablet Take 500 mg by mouth daily.    [provider]  mometasone (NASONEX) 50 MCG/ACT nasal spray Place 2 sprays into the nose daily.    [provider]  Multiple Vitamin (MULTIVITAMIN) tablet Take 1 tablet by mouth daily.    [provider]  ondansetron  (ZOFRAN -ODT) 8 MG disintegrating tablet Take 8 mg by mouth as needed. 08/11/22   [provider]  rivaroxaban  (XARELTO ) 10 MG TABS tablet Take 1 tablet (10 mg total) by mouth daily. 01/02/24   Brown, Fallon E, NP  triamcinolone  cream (KENALOG ) 0.1 % Apply 1 Application topically 2 (two) times daily as needed (irritation).    [provider]  Vitamin D , Ergocalciferol , (DRISDOL ) 1.25 MG (50000 UNIT) CAPS capsule Take 1 capsule (50,000 Units total) by mouth every 7 (seven) days. 11/17/23   Hope Merle, MD    Allergies: Codeine,  Latex, Morphine, Morphine and codeine, and Amoxicillin-pot clavulanate    Review of Systems  Neurological:  Positive for headaches.  All other systems reviewed and are negative.   Updated Vital Signs BP (!) 141/85 (BP Location: Right Arm)   Pulse (!) 53   Temp 97.8 F (36.6 C)   Resp 18   SpO2 100%   Physical Exam Vitals and nursing note reviewed.  Constitutional:      Appearance: Normal appearance.  HENT:     Head:     Comments: Right scalp hematoma    Nose: Nose normal.     Mouth/Throat:     Mouth: Mucous membranes are moist.  Eyes:     Extraocular Movements: Extraocular movements  intact.     Pupils: Pupils are equal, round, and reactive to light.  Neck:     Comments: Mild right paracervical tenderness Cardiovascular:     Rate and Rhythm: Normal rate and regular rhythm.     Pulses: Normal pulses.     Heart sounds: Normal heart sounds.  Pulmonary:     Effort: Pulmonary effort is normal.     Breath sounds: Normal breath sounds.  Abdominal:     General: Abdomen is flat.     Palpations: Abdomen is soft.  Musculoskeletal:     Comments: Patient has some tenderness in the left fifth finger.  Patient also has some tenderness of the right wrist.  Patient has bilateral knee tenderness with no obvious deformity.  No obvious spinal tenderness.   Skin:    General: Skin is warm.     Capillary Refill: Capillary refill takes less than 2 seconds.  Neurological:     Mental Status: She is alert.  Psychiatric:        Mood and Affect: Mood normal.        Behavior: Behavior normal.     (all labs ordered are listed, but only abnormal results are displayed) Labs Reviewed - No data to display  EKG: None  Radiology: DG Wrist Complete Right Result Date: 06/17/2024 CLINICAL DATA:  Status post fall. EXAM: RIGHT WRIST - COMPLETE 3+ VIEW COMPARISON:  None Available. FINDINGS: There is no evidence of an acute fracture or dislocation. Moderate to marked severity degenerative changes are seen involving the first carpometacarpal joint. Soft tissues are unremarkable. IMPRESSION: 1. No acute fracture or dislocation. 2. 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 Electronically Signed   By: Suzen Dials M.D.   On: 06/17/2024 15:55   DG Hand Complete Right Result Date: 06/17/2024 CLINICAL DATA:  Status post fall. EXAM: RIGHT HAND - COMPLETE 3+ VIEW COMPARISON:  None Available. FINDINGS: A small, ill-defined area of cortical irregularity of indeterminate age is seen involving the right pisiform bone. There is no evidence of dislocation.  Moderate to marked severity degenerative changes are seen involving the first carpometacarpal joint. Mild degenerative changes are present throughout the remainder of the right wrist. Soft tissues are unremarkable. IMPRESSION: 1. Findings this may represent a small fracture of indeterminate age involving the right pisiform bone. Correlation with physical examination is recommended to determine the presence of point tenderness, with subsequent nonemergent CT evaluation if acute fracture remains of clinical concern. 2. 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 Electronically Signed   By: Suzen Dials M.D.   On: 06/17/2024 15:54     Procedures   Medications Ordered in the ED  traMADol (ULTRAM) tablet 50 mg (has no administration in time range)  Medical Decision Making Christine Morgan Keinath is a 77 y.o. female here presenting with fall.  Concern for intracranial bleeding versus cervical fracture versus extremity injuries.  Plan to get CT head and cervical spine and extremity x-rays.  Will give tramadol for pain.  7:15 PM Initial right wrist x-ray showed possible pisiform fracture.  However the CT did not show any fracture.  Also right knee x-ray showed possible tibial plateau fracture but the CT did not show a fracture but patient does have arthritis.  Patient's left knee x-ray showed possible patella fracture but CT also did not show any fractures.  CT head and cervical spine unremarkable as well. Patient was given a wrist splint for comfort.  Stable for discharge  Problems Addressed: Contusion of knee, unspecified laterality, initial encounter: acute illness or injury Fall, initial encounter: acute illness or injury Hematoma of scalp, initial encounter: acute illness or injury Sprain of right wrist, initial encounter: acute illness or injury  Amount and/or Complexity of Data Reviewed Radiology:  ordered.  Risk Prescription drug management.     Final diagnoses:  None    ED Discharge Orders     None          Patt Alm Macho, MD 06/17/24 RENARD

## 2024-06-17 NOTE — Progress Notes (Signed)
 Orthopedic Tech Progress Note Patient Details:  Christine Manning [birth year] [id number]  Ortho Devices Type of Ortho Device: Velcro wrist splint Ortho Device/Splint Location: RUE Ortho Device/Splint Interventions: Application   Post Interventions Patient Tolerated: Well  Massie BRAVO Teosha Casso 06/17/2024, 7:01 PM

## 2024-07-11 ENCOUNTER — Ambulatory Visit: Attending: Cardiology

## 2024-07-11 DIAGNOSIS — I471 Supraventricular tachycardia, unspecified: Secondary | ICD-10-CM

## 2024-07-11 LAB — ECHOCARDIOGRAM COMPLETE
AV Mean grad: 3 mmHg
AV Peak grad: 5.3 mmHg
Ao pk vel: 1.15 m/s
Area-P 1/2: 4.49 cm2
S' Lateral: 2.55 cm

## 2024-07-12 ENCOUNTER — Ambulatory Visit: Payer: Self-pay | Admitting: Cardiology

## 2024-08-30 ENCOUNTER — Encounter

## 2024-08-31 ENCOUNTER — Ambulatory Visit: Attending: Cardiology | Admitting: Cardiology

## 2024-08-31 VITALS — BP 90/64 | HR 67 | Ht 59.0 in | Wt 117.8 lb

## 2024-08-31 DIAGNOSIS — I471 Supraventricular tachycardia, unspecified: Secondary | ICD-10-CM

## 2024-08-31 DIAGNOSIS — E7800 Pure hypercholesterolemia, unspecified: Secondary | ICD-10-CM

## 2024-08-31 DIAGNOSIS — I10 Essential (primary) hypertension: Secondary | ICD-10-CM

## 2024-08-31 MED ORDER — LISINOPRIL 10 MG PO TABS: 20.0000 mg | ORAL_TABLET | Freq: Every day | ORAL | 3 refills | Status: DC

## 2024-08-31 NOTE — Progress Notes (Signed)
 Cardiology Office Note:    Date:  08/31/2024   ID:  Christine Manning, DOB 1945-12-14, MRN [id number]  PCP:  Patient, No Pcp Per   [facility] Providers Cardiologist:  None     Referring MD: No ref. provider found   Chief Complaint  Patient presents with   Follow-up    Doing well, no cardiac issues.     History of Present Illness:    Christine Manning is a 78 y.o. female with a hx of hypertension, hyperlipidemia, paroxysmal SVT, DVT on Xarelto  who presents for follow-up.   Last seen due to palpitations and paroxysmal SVT.  Low normal BP and dizziness.  HCTZ was stopped.  Currently on lisinopril  20 mg daily.  Doing okay, states her palpitations are not very frequent.  Denies dizziness or syncope.   Prior notes/testing Cardiac monitor 03/2024 occasional nonsustained SVT longest lasting 16 seconds, no A-fib or flutter.  Average heart rate 67, range 42-134 bpm. Outside echo 05/2020 with normal EF 65%, mild AI. Myocardial perfusion stress test 02/2020 normal with no ischemia.  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   Colon polyp    DDD (degenerative disc disease)    Family history of adverse reaction to anesthesia    sister with post op nausea   Heart murmur    History of deep vein thrombosis (DVT) of lower extremity 2023   right   History of Helicobacter infection    History of radioactive iodine thyroid  ablation    Hyperlipidemia    Hypertension    Hyperthyroidism    Hypothyroidism    Osteoporosis without current pathological fracture    Personal history of DVT (deep vein thrombosis) 1979   right leg during pregnancy   PONV (postoperative nausea and vomiting)    Primary osteoarthritis of left knee    Pulmonary nodules    Thrombophilia    Thyroid  nodule    Traveling blood clot in leg (HCC)    Type 2 diabetes mellitus with microalbuminuria, without long-term current use of  insulin (HCC)    Vitamin D  deficiency     Past Surgical History:  Procedure Laterality Date   ABDOMINAL HYSTERECTOMY  09/20/1989   secondary to abnormal PAPs   ANAL RECTAL MANOMETRY N/A 01/24/2017   Procedure: ANO RECTAL MANOMETRY;  Surgeon: Shila Gustav GAILS, MD;  Location: [facility];  Service: Endoscopy;  Laterality: N/A;   BLADDER SUSPENSION  09/20/2001   BREAST BIOPSY Right 1984   neg   CESAREAN SECTION  1979   COLONOSCOPY  09/20/2014   COLONOSCOPY  11/29/2019   CORONARY ANGIOPLASTY  09/21/2011   arteries were clean .  Dr. Nivia at [facility]    KNEE ARTHROSCOPY Left    THYROIDECTOMY, PARTIAL Right 02/01/2019   TONSILLECTOMY     TOTAL KNEE ARTHROPLASTY Left 07/08/2021   TUBAL LIGATION      Current Medications: Current Meds  Medication Sig   acetaminophen  (TYLENOL ) 650 MG CR tablet Take 1,300 mg by mouth as needed.   albuterol  (VENTOLIN  HFA) 108 (90 Base) MCG/ACT inhaler Inhale 2 puffs into the lungs every 4 (four) hours as needed for wheezing or shortness of breath.   ascorbic acid (VITAMIN C) 1000 MG tablet Take 1 tablet by mouth daily.   atorvastatin (LIPITOR) 80 MG tablet Take 80 mg by mouth at bedtime.   benzonatate  (TESSALON ) 100 MG capsule Take 1 capsule (100 mg total)  by mouth every 8 (eight) hours.   Blood Glucose Monitoring Suppl DEVI 1 each by Does not apply route in the morning, at noon, and at bedtime. May substitute to any manufacturer covered by patient's insurance.   cyanocobalamin  (VITAMIN B12) 1000 MCG/ML injection Inject 1,000 mcg into the muscle every 30 (thirty) days.   gabapentin  (NEURONTIN ) 300 MG capsule Take 1 capsule (300 mg total) by mouth 3 (three) times daily as needed.   ipratropium-albuterol  (DUONEB) 0.5-2.5 (3) MG/3ML SOLN Inhale 3 mLs into the lungs every 6 (six) hours as needed.   levothyroxine  (SYNTHROID ) 50 MCG tablet Take 1 tablet (50 mcg total) by mouth daily.   loratadine  (CLARITIN ) 10 MG tablet Take 1 tablet (10 mg total) by mouth daily.    MAGNESIUM PO Take 1 tablet by mouth daily.   metFORMIN (GLUCOPHAGE) 500 MG tablet Take 500 mg by mouth daily.   mometasone (NASONEX) 50 MCG/ACT nasal spray Place 2 sprays into the nose daily.   Multiple Vitamin (MULTIVITAMIN) tablet Take 1 tablet by mouth daily.   ondansetron  (ZOFRAN -ODT) 8 MG disintegrating tablet Take 8 mg by mouth as needed.   rivaroxaban  (XARELTO ) 10 MG TABS tablet Take 1 tablet (10 mg total) by mouth daily.   traMADol  (ULTRAM ) 50 MG tablet Take 1 tablet (50 mg total) by mouth every 6 (six) hours as needed.   triamcinolone  cream (KENALOG ) 0.1 % Apply 1 Application topically 2 (two) times daily as needed (irritation).   Vitamin D , Ergocalciferol , (DRISDOL ) 1.25 MG (50000 UNIT) CAPS capsule Take 1 capsule (50,000 Units total) by mouth every 7 (seven) days.   [DISCONTINUED] lisinopril  (ZESTRIL ) 20 MG tablet Take 20 mg by mouth daily.     Allergies:   Codeine, Latex, Morphine, Morphine and codeine, and Amoxicillin-pot clavulanate   Social History   Socioeconomic History   Marital status: Divorced    Spouse name: Not on file   Number of children: 3   Years of education: Not on file   Highest education level: Not on file  Occupational History   Occupation: Retired  Tobacco Use   Smoking status: Never    Passive exposure: Never   Smokeless tobacco: Never  Vaping Use   Vaping status: Never Used  Substance and Sexual Activity   Alcohol use: No   Drug use: No   Sexual activity: Not Currently  Other Topics Concern   Not on file  Social History Narrative   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    Social Drivers of Health   Tobacco Use: Low Risk  (06/12/2024)   Received from [facility]   Patient History    Smoking Tobacco Use: Never    Smokeless Tobacco Use: Never    Passive Exposure: Never  Financial Resource Strain: Medium Risk (02/06/2024)   Received from [facility]   Overall Financial Resource Strain  (CARDIA)    Difficulty of Paying Living Expenses: Somewhat hard  Food Insecurity: No Food Insecurity (02/06/2024)   Received from [facility]   Epic    Within the past 12 months, you worried that your food would run out before you got the money to buy more.: Never true    Within the past 12 months, the food you bought just didn't last and you didn't have money to get more.: Never true  Transportation Needs: Unmet Transportation Needs (02/06/2024)   Received from [facility] - Transportation    In the  past 12 months, has lack of transportation kept you from medical appointments or from getting medications?: Yes    Lack of Transportation (Non-Medical): Yes  Physical Activity: Not on file  Stress: Not on file  Social Connections: Not on file  Depression (PHQ2-9): Low Risk (11/25/2023)   Depression (PHQ2-9)    PHQ-2 Score: 2  Alcohol Screen: Not on file  Housing: High Risk (02/06/2024)   Received from [facility]   Epic    In the last 12 months, was there a time when you were not able to pay the mortgage or rent on time?: No    In the past 12 months, how many times have you moved where you were living?: 2    At any time in the past 12 months, were you homeless or living in a shelter (including now)?: No  Utilities: Not At Risk (02/06/2024)   Received from [facility] Utilities    Threatened with loss of utilities: No  Health Literacy: Not on file     Family History: The patient's family history includes Asthma in her brother, daughter, mother, and son; Breast cancer (age of onset: 83) in her maternal aunt; Colon cancer in her cousin; Colon cancer (age of onset: 74) in her maternal aunt; Colon polyps in her sister; Yvone' disease in her sister; Hernia in her brother; Mental illness in her father and mother; Stroke in her mother; Uterine cancer in her maternal grandmother. There is no history of Diabetes,  Kidney disease, or Esophageal cancer.  ROS:   Please see the history of present illness.     All other systems reviewed and are negative.  EKGs/Labs/Other Studies Reviewed:    The following studies were reviewed today:       Recent Labs: 11/10/2023: Hemoglobin 12.0; Platelets 292.0 01/09/2024: ALT 10; BUN 22; Creatinine, Ser 1.33; Potassium 4.0; Sodium 142; TSH 0.40  Recent Lipid Panel    Component Value Date/Time   CHOL 205 (H) 11/10/2023 1510   TRIG 139.0 11/10/2023 1510   HDL 62.50 11/10/2023 1510   CHOLHDL 3 11/10/2023 1510   VLDL 27.8 11/10/2023 1510   LDLCALC 114 (H) 11/10/2023 1510   LDLDIRECT 165.0 09/10/2015 0931     Risk Assessment/Calculations:             Physical Exam:    VS:  BP 90/64 (BP Location: Left Arm, Patient Position: Sitting, Cuff Size: Normal)   Pulse 67   Ht 4' 11 (1.499 m)   Wt 117 lb 12.8 oz (53.4 kg)   SpO2 98%   BMI 23.79 kg/m     Wt Readings from Last 3 Encounters:  08/31/24 117 lb 12.8 oz (53.4 kg)  06/01/24 117 lb 6 oz (53.2 kg)  02/29/24 122 lb 9.6 oz (55.6 kg)     GEN:  Well nourished, well developed in no acute distress HEENT: Normal NECK: No JVD; No carotid bruits CARDIAC: RRR, no murmurs, rubs, gallops RESPIRATORY:  Clear to auscultation without rales, wheezing or rhonchi  ABDOMEN: Soft, non-tender, non-distended MUSCULOSKELETAL:  No edema; No deformity  SKIN: Warm and dry NEUROLOGIC:  Alert and oriented x 3 PSYCHIATRIC:  Normal affect   ASSESSMENT:    1. Paroxysmal SVT (supraventricular tachycardia)   2. Primary hypertension   3. Pure hypercholesterolemia    PLAN:    In order of problems listed above:  Palpitations,  paroxysmal SVT noted on cardiac monitor.  Echo 10/25 EF 60 to 65%.  Low  BP, bradycardia preventing AV nodal agent at this time.  If symptoms of palpitations become more frequent, will consider Amio. Hypertension, BP low normal, occasional dizziness.  Patient is also diabetic.  Reduce lisinopril  to  10 mg daily.   Hyperlipidemia, continue Lipitor 80 mg daily.  Follow-up in 6 months.     Medication Adjustments/Labs and Tests Ordered: Current medicines are reviewed at length with the patient today.  Concerns regarding medicines are outlined above.  No orders of the defined types were placed in this encounter.  Meds ordered this encounter  Medications   lisinopril  (ZESTRIL ) 10 MG tablet    Sig: Take 2 tablets (20 mg total) by mouth daily.    Dispense:  30 tablet    Refill:  3    Patient Instructions  Medication Instructions:  - DECREASE lisinopril  to 10 mg daily    *If you need a refill on your cardiac medications before your next appointment, please call your pharmacy*  Lab Work: No labs ordered today  If you have labs (blood work) drawn today and your tests are completely normal, you will receive your results only by: MyChart Message (if you have MyChart) OR A paper copy in the mail If you have any lab test that is abnormal or we need to change your treatment, we will call you to review the results.  Testing/Procedures: No test ordered today   Follow-Up: At [facility], you and your health needs are our priority.  As part of our continuing mission to provide you with exceptional heart care, our providers are all part of one team.  This team includes your primary Cardiologist (physician) and Advanced Practice Providers or APPs (Physician Assistants and Nurse Practitioners) who all work together to provide you with the care you need, when you need it.  Your next appointment:   6 month(s)  Provider:   You may see Dr Darliss or one of the following Advanced Practice Providers on your designated Care Team:   Lonni Meager, NP Lesley Maffucci, PA-C Bernardino Bring, PA-C Cadence [place], PA-C Tylene Lunch, NP Barnie Hila, NP    We recommend signing up for the patient portal called MyChart.  Sign up information is provided on this After Visit Summary.   MyChart is used to connect with patients for Virtual Visits (Telemedicine).  Patients are able to view lab/test results, encounter notes, upcoming appointments, etc.  Non-urgent messages can be sent to your provider as well.   To learn more about what you can do with MyChart, go to [email or web address].    Duwaine Louder, DO Allena Hamilton, MD        Signed, Redell Darliss, MD  08/31/2024 12:28 PM    [facility]

## 2024-08-31 NOTE — Patient Instructions (Signed)
 Medication Instructions:  - DECREASE lisinopril  to 10 mg daily    *If you need a refill on your cardiac medications before your next appointment, please call your pharmacy*  Lab Work: No labs ordered today  If you have labs (blood work) drawn today and your tests are completely normal, you will receive your results only by: MyChart Message (if you have MyChart) OR A paper copy in the mail If you have any lab test that is abnormal or we need to change your treatment, we will call you to review the results.  Testing/Procedures: No test ordered today   Follow-Up: At [facility], you and your health needs are our priority.  As part of our continuing mission to provide you with exceptional heart care, our providers are all part of one team.  This team includes your primary Cardiologist (physician) and Advanced Practice Providers or APPs (Physician Assistants and Nurse Practitioners) who all work together to provide you with the care you need, when you need it.  Your next appointment:   6 month(s)  Provider:   You may see Dr Darliss or one of the following Advanced Practice Providers on your designated Care Team:   Lonni Meager, NP Lesley Maffucci, PA-C Bernardino Bring, PA-C Cadence [place], PA-C Tylene Lunch, NP Barnie Hila, NP    We recommend signing up for the patient portal called MyChart.  Sign up information is provided on this After Visit Summary.  MyChart is used to connect with patients for Virtual Visits (Telemedicine).  Patients are able to view lab/test results, encounter notes, upcoming appointments, etc.  Non-urgent messages can be sent to your provider as well.   To learn more about what you can do with MyChart, go to [email or web address].    Duwaine Louder, DO Allena Hamilton, MD

## 2024-09-18 ENCOUNTER — Ambulatory Visit

## 2024-10-24 ENCOUNTER — Other Ambulatory Visit: Payer: Self-pay | Admitting: Cardiology
